# Patient Record
Sex: Female | Born: 1955
Health system: Southern US, Community
[De-identification: ages and names within clinical notes are randomized; demographics above are authoritative.]

## PROBLEM LIST (undated history)

## (undated) DIAGNOSIS — T4145XA Adverse effect of unspecified anesthetic, initial encounter: Secondary | ICD-10-CM

## (undated) DIAGNOSIS — T8859XA Other complications of anesthesia, initial encounter: Secondary | ICD-10-CM

## (undated) DIAGNOSIS — M069 Rheumatoid arthritis, unspecified: Secondary | ICD-10-CM

## (undated) HISTORY — PX: TUBAL LIGATION: SHX77

---

## 2004-03-23 ENCOUNTER — Ambulatory Visit (HOSPITAL_COMMUNITY): Admission: RE | Admit: 2004-03-23 | Discharge: 2004-03-23 | Payer: Self-pay | Admitting: Obstetrics and Gynecology

## 2004-04-07 ENCOUNTER — Ambulatory Visit (HOSPITAL_COMMUNITY): Admission: RE | Admit: 2004-04-07 | Discharge: 2004-04-07 | Payer: Self-pay | Admitting: Obstetrics and Gynecology

## 2006-07-24 ENCOUNTER — Ambulatory Visit (HOSPITAL_COMMUNITY): Admission: RE | Admit: 2006-07-24 | Discharge: 2006-07-24 | Payer: Self-pay | Admitting: Internal Medicine

## 2006-08-08 ENCOUNTER — Ambulatory Visit (HOSPITAL_COMMUNITY): Admission: RE | Admit: 2006-08-08 | Discharge: 2006-08-08 | Payer: Self-pay | Admitting: Internal Medicine

## 2006-08-11 ENCOUNTER — Ambulatory Visit (HOSPITAL_COMMUNITY): Admission: RE | Admit: 2006-08-11 | Discharge: 2006-08-11 | Payer: Self-pay | Admitting: Internal Medicine

## 2006-11-15 ENCOUNTER — Ambulatory Visit (HOSPITAL_COMMUNITY): Admission: RE | Admit: 2006-11-15 | Discharge: 2006-11-15 | Payer: Self-pay | Admitting: Internal Medicine

## 2007-01-25 ENCOUNTER — Ambulatory Visit (HOSPITAL_COMMUNITY): Admission: RE | Admit: 2007-01-25 | Discharge: 2007-01-25 | Payer: Self-pay | Admitting: Internal Medicine

## 2007-02-19 ENCOUNTER — Ambulatory Visit (HOSPITAL_COMMUNITY): Admission: RE | Admit: 2007-02-19 | Discharge: 2007-02-19 | Payer: Self-pay | Admitting: Internal Medicine

## 2007-03-23 ENCOUNTER — Ambulatory Visit: Payer: Self-pay | Admitting: Internal Medicine

## 2007-07-23 ENCOUNTER — Ambulatory Visit (HOSPITAL_COMMUNITY): Admission: RE | Admit: 2007-07-23 | Discharge: 2007-07-23 | Payer: Self-pay | Admitting: Internal Medicine

## 2007-09-11 IMAGING — CT CT CHEST W/O CM
2 of 3 series · 15 of 36 positions shown, 18 images · IV contrast (agent unspecified)
Comparison: Chest radiographs 07/24/2006 and 08/08/2006. There are no other prior studies for correlation.

CLINICAL DATA: Right upper lobe nodularity on chest radiographs.  No reported history of malignancy.
CHEST CT WITHOUT CONTRAST:
TECHNIQUE: Multidetector CT imaging of the chest was performed following the standard protocol without IV contrast.

[Series 2: chestroutine 5.0 b40f · axial · 0.62mm/px · z∈[-320,-45]mm · 12 of 65 slices shown, 15 images]
[im 5/65  mediastinal]
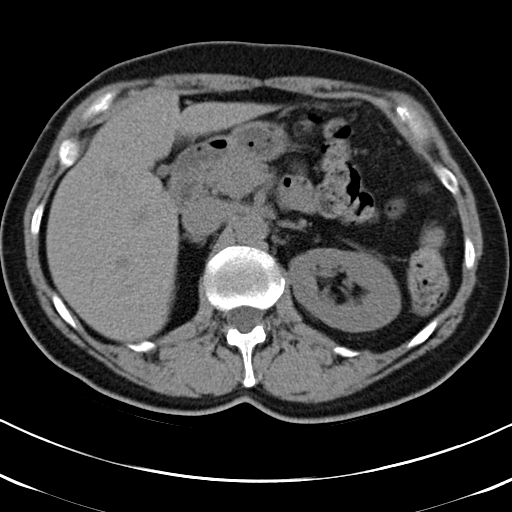
[im 5/65  lung]
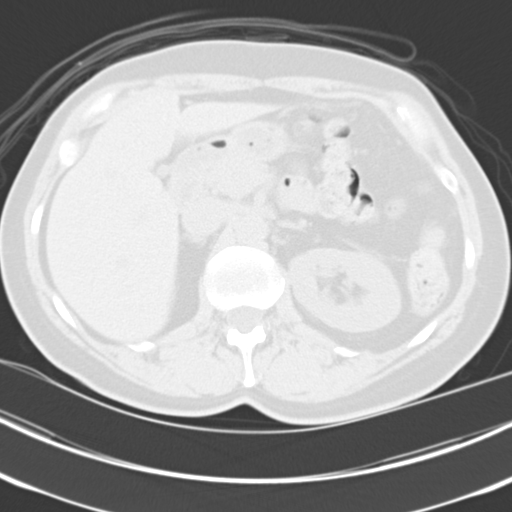
[im 10/65  lung]
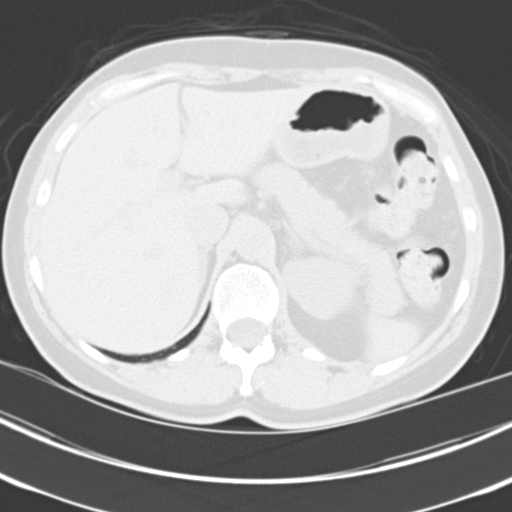
[im 15/65  lung]
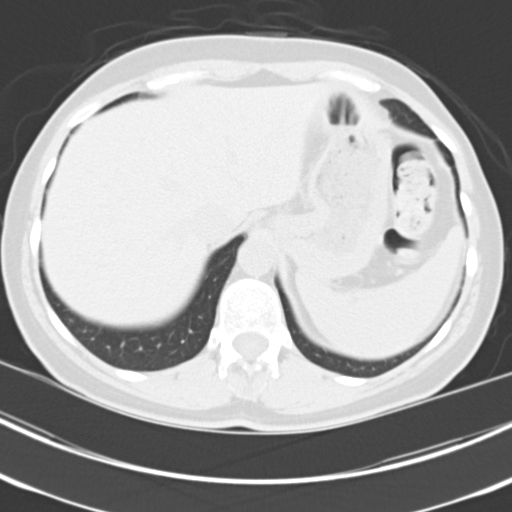
[im 19/65  lung]
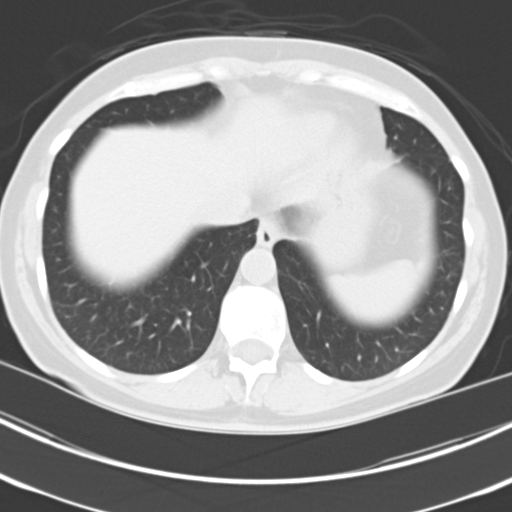
[im 24/65  mediastinal]
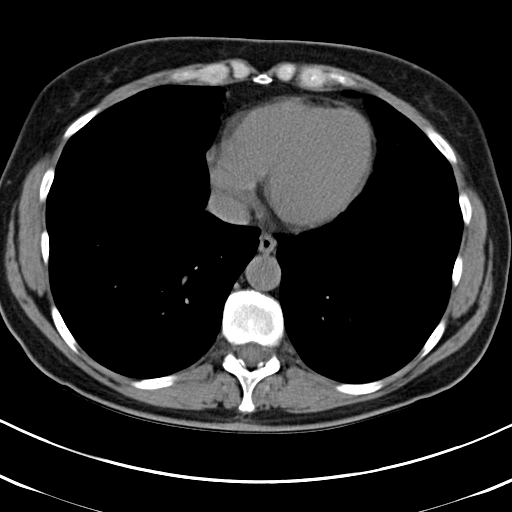
[im 24/65  lung]
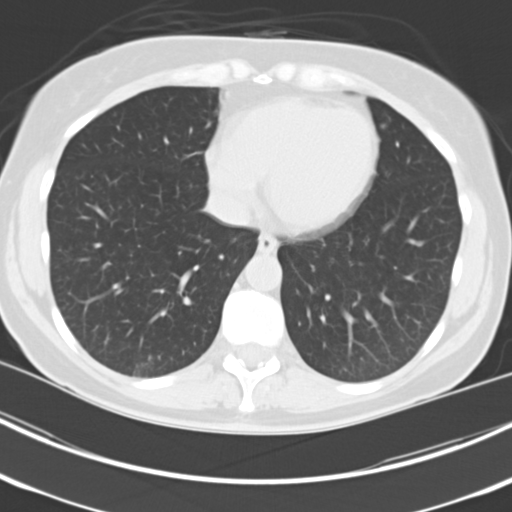
[im 29/65  lung]
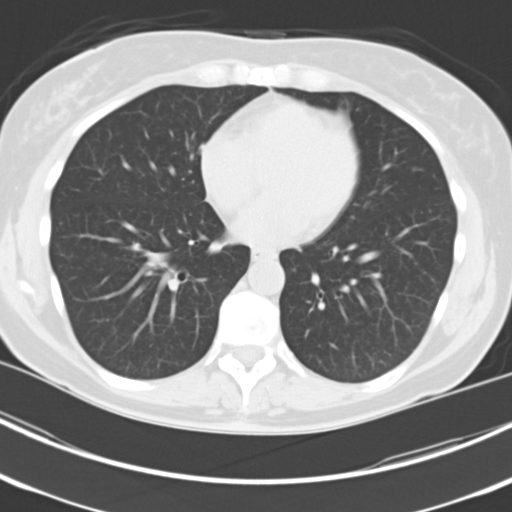
[im 36/65  lung]
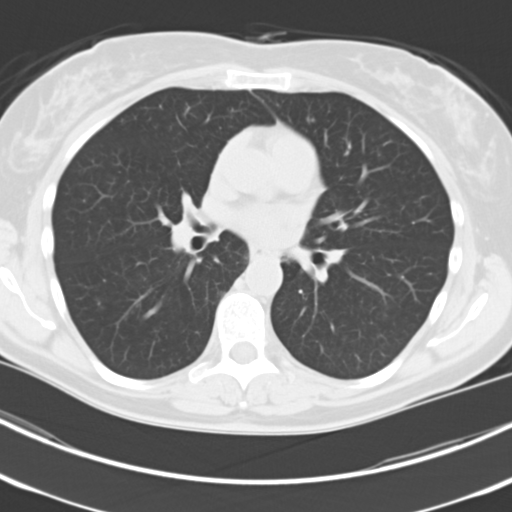
[im 41/65  lung]
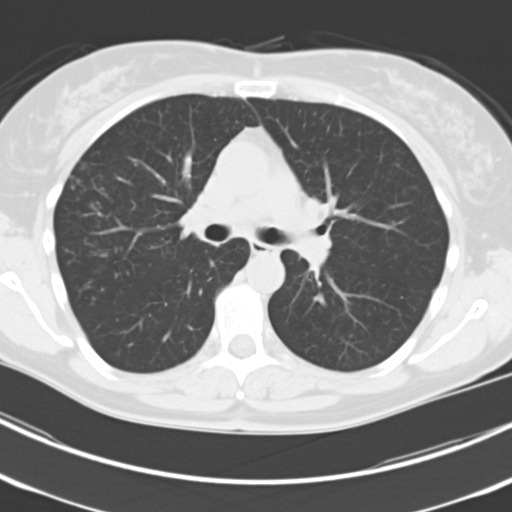
[im 46/65  mediastinal]
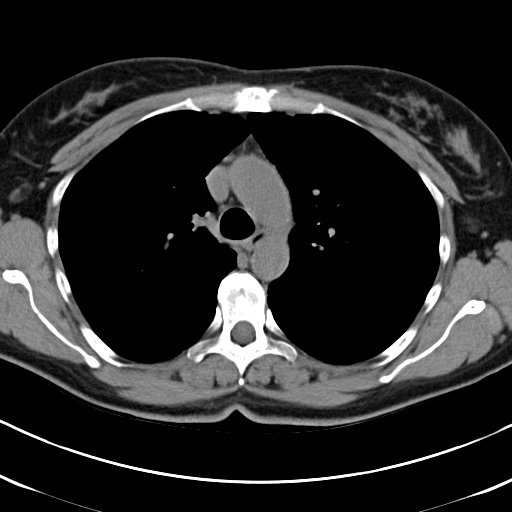
[im 46/65  lung]
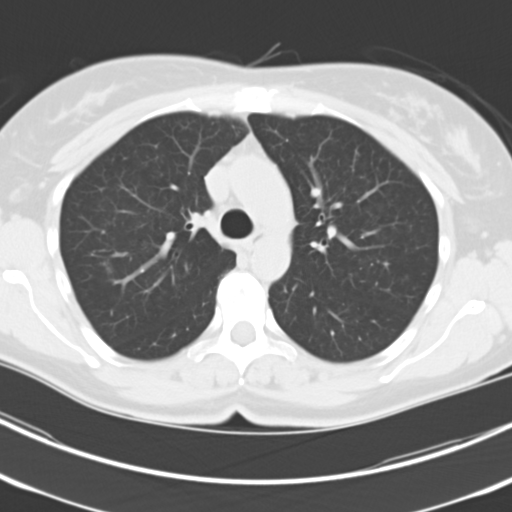
[im 50/65  lung]
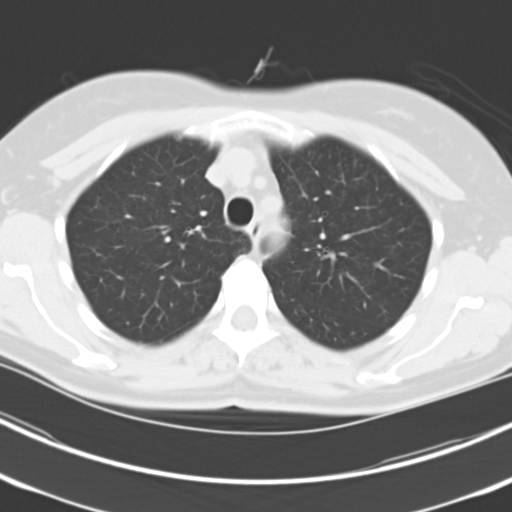
[im 55/65  lung]
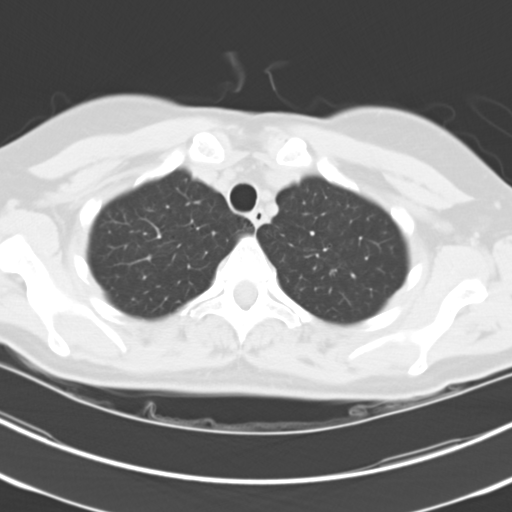
[im 60/65  lung]
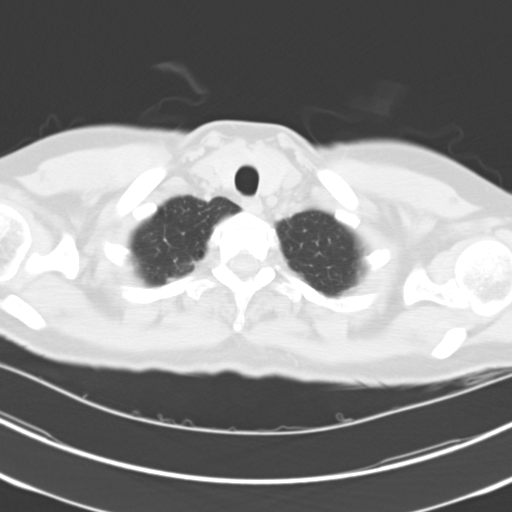

[Series 4: chestroutine 3.0 spo cor · coronal · 0.59mm/px · 3 of 71 slices shown]
[im 15/71  lung]
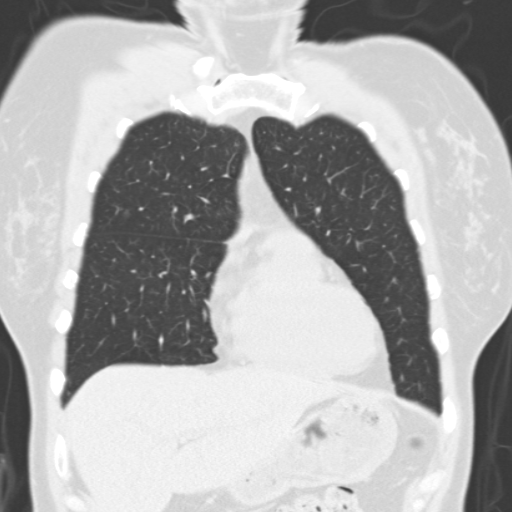
[im 29/71  lung]
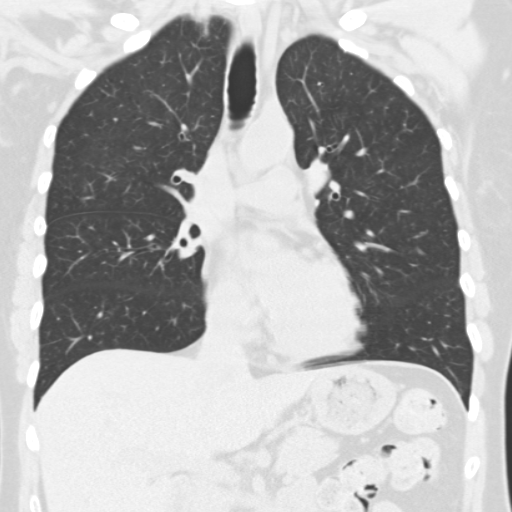
[im 43/71  lung]
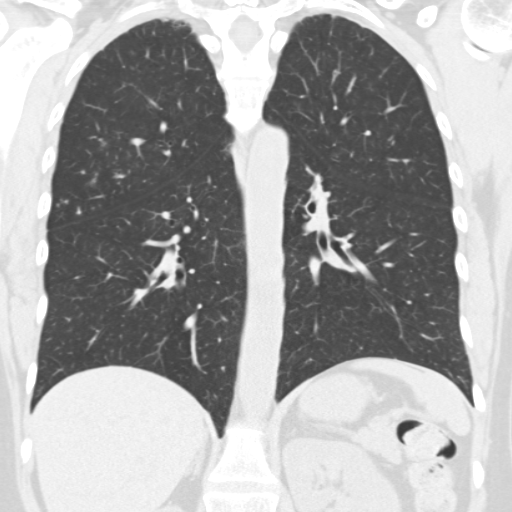

[15 of 36 positions shown; findings below may reference images not displayed]

FINDINGS: Corresponding with the chest radiographic density is a bilobed nodular right upper lobe density seen on axial image #27.  Two adjacent nodular components each measure 7mm in diameter.  More posteriorly in the right upper lobe, there is additional nodularity with a "tree and Toor" appearance suggesting an infectious or inflammatory process.  Aside from a 1mm nodule in the right middle lobe on image #35, there are no other pulmonary nodules.  There is no endobronchial lesion or confluent air space opacity.
Within the limitations of non-contrast technique, there is no evidence of mediastinal or hilar adenopathy.  There is no pleural or pericardial effusion.  The visualized upper abdomen appears unremarkable without evidence of adrenal mass.  No osseous abnormalities are demonstrated.
IMPRESSION: Two dominant adjacent right upper lobe nodules are associated with tree and Toor density in the adjacent right upper lobe and suggest an infectious/inflammatory process.  This would be an unusual appearance for neoplasm, and there is no adenopathy.  Clinical correlation and CT follow-up in 3-months are recommended.

## 2008-12-23 ENCOUNTER — Ambulatory Visit (HOSPITAL_COMMUNITY): Admission: RE | Admit: 2008-12-23 | Discharge: 2008-12-23 | Payer: Self-pay | Admitting: Internal Medicine

## 2010-12-07 NOTE — Assessment & Plan Note (Signed)
[facility]                             PULMONARY OFFICE NOTE   NAME:[name], [name]                  MRN:          [id number]  DATE:03/23/2007                            DOB:          08-Mar-1956    REFERRING PHYSICIAN:  Paula Compton. Willey Blade, MD   CHIEF COMPLAINT:  Pulmonary nodules.   HISTORY:  A 55 year old white female, active smoker, with clinical  diagnosis of pneumonia in November of 2007 with persistent abnormalities  in the right upper lobe by CT scan.  She actually denies any complaints.  The record indicates she has had current occasional flare-ups of  rhinitis/sinusitis/tracheal bronchitis, but is not on any form of  chronic medication.  She does have occasional night sweats which she  says have been present ever since she stopped having periods associated  with hot flashes.  She denies any unintended weight loss, persistent  purulent sputum, hemoptysis, pleuritic pain, or dyspnea with exertion,  fevers, chills, or unintended weight loss.   PAST MEDICAL HISTORY:  Significant for the absence of chronic medical  illnesses.   ALLERGIES:  None known.   MEDICATIONS:  None regularly.   SOCIAL HISTORY:  She continues to smoke 1-1/2 packs per day.  Where she  works she is exposed to oil fumes and sprays.   FAMILY HISTORY:  Significant for lots of cancer on her father's side of  the family, otherwise negative for respiratory diseases or atopy.   REVIEW OF SYSTEMS:  Taken in detail on the worksheet and negative except  as outlined above.   PHYSICAL EXAMINATION:  This is a pleasant, ambulatory white female who  has a slight nasal tone to her voice.  She is afebrile, normal vital signs.  HEENT:  Reveals mild turbinate edema, nonspecific.  Oropharynx is clear.  Ear canals clear bilaterally.  NECK:  Supple without cervical adenopathy or tenderness.  Trachea is  midline.  LUNGS:  Perfectly clear bilaterally to auscultation and percussion with  no localized, generalized, or otherwise wheezing or rhonchi.  HEART:  There is regular rate and rhythm without murmur, gallop, or rub.  No increase in P2.  ABDOMEN:  Soft, benign.  No hepatomegaly, mass, or tenderness.  EXTREMITIES:  Warm without calf tenderness, cyanosis, clubbing, or  edema.   Saturation is 96% on room air.   CT scan:  Most recent CT scan was reviewed dated July 28 and although  the CT scan report shows increased tree-in-bud appearance in the right  upper, the actual appearance is quite subtle and the nodules in question  that have been seen previously are actually slightly smaller.   IMPRESSION:  Most likely this patient has an element of chronic  obstructive pulmonary/chronic bronchitis with a quiet mucosary  dysfunction and an area of previous infection in the right upper lobe  which is somewhat synonymous with a bronchiectatic segment with poor  mucosary function and growth with saprophytic organisms, atypical  mycobacterial organisms.  However, this is really only a CT scan finding  as she has no clinical correlation.  All of her respiratory symptoms are  actually upper airway related to  rhinitis/tracheal bronchitis and also  probably contributed to by smoking.   Therefore I do not believe lung biopsy at this point is going to change  her therapy in that the disease is not severe enough to warrant chronic  systemic therapy (if this did turn out to be atypical mycobacterial  infection then we would be left with deciding whether to treat her for  two years or not and certainly this would not appear to be warranted).   Neoplasm is extremely unlikely given the above pattern.   I have therefore recommended follow-up here in three months with a chest  x-ray and PFTs and in the meantime pleaded with her to make the  commitment to quit smoking.     Tammy Deem. Melvyn Novas, MD, [facility]  Electronically Signed    MBW/MedQ  DD: 03/23/2007  DT: 03/25/2007  Job #: [id number]    cc:   Paula Compton. Willey Blade, MD

## 2011-06-14 ENCOUNTER — Other Ambulatory Visit (HOSPITAL_COMMUNITY): Payer: Self-pay | Admitting: Internal Medicine

## 2011-06-14 DIAGNOSIS — Z139 Encounter for screening, unspecified: Secondary | ICD-10-CM

## 2011-06-21 ENCOUNTER — Ambulatory Visit (HOSPITAL_COMMUNITY)
Admission: RE | Admit: 2011-06-21 | Discharge: 2011-06-21 | Disposition: A | Discharge status: Home / Self Care | Payer: BC Managed Care – PPO | Source: Ambulatory Visit | Attending: Internal Medicine | Admitting: Internal Medicine

## 2011-06-21 DIAGNOSIS — Z139 Encounter for screening, unspecified: Secondary | ICD-10-CM

## 2011-06-21 DIAGNOSIS — Z1231 Encounter for screening mammogram for malignant neoplasm of breast: Secondary | ICD-10-CM | POA: Insufficient documentation

## 2011-06-27 ENCOUNTER — Other Ambulatory Visit: Payer: Self-pay | Admitting: Internal Medicine

## 2011-06-27 DIAGNOSIS — R928 Other abnormal and inconclusive findings on diagnostic imaging of breast: Secondary | ICD-10-CM

## 2011-07-06 ENCOUNTER — Ambulatory Visit (HOSPITAL_COMMUNITY)
Admission: RE | Admit: 2011-07-06 | Discharge: 2011-07-06 | Disposition: A | Discharge status: Home / Self Care | Payer: BC Managed Care – PPO | Source: Ambulatory Visit | Attending: Internal Medicine | Admitting: Internal Medicine

## 2011-07-06 ENCOUNTER — Ambulatory Visit (HOSPITAL_COMMUNITY): Payer: BC Managed Care – PPO

## 2011-07-06 ENCOUNTER — Other Ambulatory Visit: Payer: Self-pay | Admitting: Internal Medicine

## 2011-07-06 DIAGNOSIS — R928 Other abnormal and inconclusive findings on diagnostic imaging of breast: Secondary | ICD-10-CM

## 2011-07-06 DIAGNOSIS — R921 Mammographic calcification found on diagnostic imaging of breast: Secondary | ICD-10-CM

## 2011-07-21 ENCOUNTER — Encounter (HOSPITAL_COMMUNITY): Payer: BC Managed Care – PPO

## 2011-07-21 ENCOUNTER — Ambulatory Visit
Admission: RE | Admit: 2011-07-21 | Discharge: 2011-07-21 | Disposition: A | Discharge status: Home / Self Care | Payer: BC Managed Care – PPO | Source: Ambulatory Visit | Attending: Internal Medicine | Admitting: Internal Medicine

## 2011-07-21 DIAGNOSIS — R921 Mammographic calcification found on diagnostic imaging of breast: Secondary | ICD-10-CM

## 2014-05-27 ENCOUNTER — Other Ambulatory Visit (HOSPITAL_COMMUNITY): Payer: Self-pay | Admitting: Internal Medicine

## 2014-05-27 DIAGNOSIS — Z139 Encounter for screening, unspecified: Secondary | ICD-10-CM

## 2014-05-28 ENCOUNTER — Ambulatory Visit (HOSPITAL_COMMUNITY)
Admission: RE | Admit: 2014-05-28 | Discharge: 2014-05-28 | Disposition: A | Discharge status: Home / Self Care | Payer: BC Managed Care – PPO | Source: Ambulatory Visit | Attending: Internal Medicine | Admitting: Internal Medicine

## 2014-05-28 DIAGNOSIS — Z1231 Encounter for screening mammogram for malignant neoplasm of breast: Secondary | ICD-10-CM | POA: Diagnosis not present

## 2014-05-28 DIAGNOSIS — Z139 Encounter for screening, unspecified: Secondary | ICD-10-CM

## 2014-05-30 ENCOUNTER — Other Ambulatory Visit: Payer: Self-pay | Admitting: Internal Medicine

## 2014-05-30 DIAGNOSIS — R928 Other abnormal and inconclusive findings on diagnostic imaging of breast: Secondary | ICD-10-CM

## 2014-06-04 ENCOUNTER — Other Ambulatory Visit: Payer: Self-pay | Admitting: Internal Medicine

## 2014-06-04 DIAGNOSIS — R928 Other abnormal and inconclusive findings on diagnostic imaging of breast: Secondary | ICD-10-CM

## 2014-06-30 ENCOUNTER — Telehealth: Payer: Self-pay | Admitting: *Deleted

## 2014-06-30 NOTE — Telephone Encounter (Signed)
Pt would like to order additional orthotics.

## 2014-07-08 ENCOUNTER — Ambulatory Visit (HOSPITAL_COMMUNITY)
Admission: RE | Admit: 2014-07-08 | Discharge: 2014-07-08 | Disposition: A | Discharge status: Home / Self Care | Payer: BC Managed Care – PPO | Source: Ambulatory Visit | Attending: Internal Medicine | Admitting: Internal Medicine

## 2014-07-08 ENCOUNTER — Encounter (HOSPITAL_COMMUNITY): Payer: BC Managed Care – PPO

## 2014-07-08 ENCOUNTER — Other Ambulatory Visit (HOSPITAL_COMMUNITY): Payer: Self-pay | Admitting: Internal Medicine

## 2014-07-08 DIAGNOSIS — N6002 Solitary cyst of left breast: Secondary | ICD-10-CM | POA: Insufficient documentation

## 2014-07-08 DIAGNOSIS — N6320 Unspecified lump in the left breast, unspecified quadrant: Secondary | ICD-10-CM

## 2014-07-08 DIAGNOSIS — Z1231 Encounter for screening mammogram for malignant neoplasm of breast: Secondary | ICD-10-CM | POA: Insufficient documentation

## 2014-07-08 DIAGNOSIS — R928 Other abnormal and inconclusive findings on diagnostic imaging of breast: Secondary | ICD-10-CM

## 2014-07-16 NOTE — Telephone Encounter (Signed)
I called the patient she was inquiring on ordering a 2nd pair of orthotics for both her husband and son-in-law.  I was able to place both orders for her and will follow up with her when they arrive [facility]

## 2014-07-16 NOTE — Telephone Encounter (Signed)
Melody,can you look into this for me, I believe I tried to see if this pt was [place] or [place] and i was unable to dtermine but have called Angela Cox yet about it. Thanks, she wants a second pair

## 2014-12-04 ENCOUNTER — Other Ambulatory Visit (HOSPITAL_COMMUNITY): Payer: Self-pay | Admitting: Internal Medicine

## 2014-12-04 DIAGNOSIS — N63 Unspecified lump in unspecified breast: Secondary | ICD-10-CM

## 2014-12-04 DIAGNOSIS — Z09 Encounter for follow-up examination after completed treatment for conditions other than malignant neoplasm: Secondary | ICD-10-CM

## 2015-01-13 ENCOUNTER — Ambulatory Visit (HOSPITAL_COMMUNITY)
Admission: RE | Admit: 2015-01-13 | Discharge: 2015-01-13 | Disposition: A | Discharge status: Home / Self Care | Payer: BLUE CROSS/BLUE SHIELD | Source: Ambulatory Visit | Attending: Internal Medicine | Admitting: Internal Medicine

## 2015-01-13 ENCOUNTER — Other Ambulatory Visit (HOSPITAL_COMMUNITY): Payer: Self-pay | Admitting: Internal Medicine

## 2015-01-13 DIAGNOSIS — N63 Unspecified lump in unspecified breast: Secondary | ICD-10-CM

## 2015-01-13 DIAGNOSIS — Z09 Encounter for follow-up examination after completed treatment for conditions other than malignant neoplasm: Secondary | ICD-10-CM

## 2015-01-13 DIAGNOSIS — N6320 Unspecified lump in the left breast, unspecified quadrant: Secondary | ICD-10-CM

## 2015-11-19 ENCOUNTER — Other Ambulatory Visit (HOSPITAL_COMMUNITY): Payer: Self-pay | Admitting: Internal Medicine

## 2015-11-19 DIAGNOSIS — N63 Unspecified lump in unspecified breast: Secondary | ICD-10-CM

## 2015-12-08 ENCOUNTER — Ambulatory Visit (HOSPITAL_COMMUNITY)
Admission: RE | Admit: 2015-12-08 | Discharge: 2015-12-08 | Disposition: A | Discharge status: Home / Self Care | Payer: BLUE CROSS/BLUE SHIELD | Source: Ambulatory Visit | Attending: Internal Medicine | Admitting: Internal Medicine

## 2015-12-08 DIAGNOSIS — N63 Unspecified lump in unspecified breast: Secondary | ICD-10-CM

## 2015-12-16 ENCOUNTER — Telehealth: Payer: Self-pay

## 2015-12-16 NOTE — Telephone Encounter (Signed)
Pt was referred by Dr. Willey Blade for screening colonoscopy. She would like to do in July. I have her on my call list when I get the July schedule.

## 2015-12-23 ENCOUNTER — Telehealth: Payer: Self-pay

## 2015-12-23 NOTE — Telephone Encounter (Signed)
PLEASE CALL PATIENT ABOUT TCS.  SHE NEEDS TO SCHEDULE   [phone]

## 2015-12-23 NOTE — Telephone Encounter (Signed)
Gastroenterology Pre-Procedure Review  Request Date: 12/23/2015 Requesting Physician: Dr. Willey Blade  PATIENT REVIEW QUESTIONS: The patient responded to the following health history questions as indicated:    PT HAS RHEUMATOID ARTHRITIS/ SHE HAS BEEN ON REMICADE BUT IS SWITCHING TO HUMIRA SOON NOT NOTED IN THE MED LIST  1. Diabetes Melitis: no 2. Joint replacements in the past 12 months: no 3. Major health problems in the past 3 months: no 4. Has an artificial valve or MVP: no 5. Has a defibrillator: no 6. Has been advised in past to take antibiotics in advance of a procedure like teeth cleaning: no 7. Family history of colon cancer: no  8. Alcohol Use: no 9. History of sleep apnea: no     MEDICATIONS & ALLERGIES:    Patient reports the following regarding taking any blood thinners:   Plavix? no Aspirin? no Coumadin? no  Patient confirms/reports the following medications:  Current Outpatient Prescriptions  Medication Sig Dispense Refill  . folic acid (FOLVITE) 1 MG tablet Take 1 mg by mouth daily. Takes 3 tablets daily    . methotrexate (RHEUMATREX) 2.5 MG tablet Take 2.5 mg by mouth once a week. Caution:Chemotherapy. Protect from light.  PT TAKES 6 TABLETS ONCE A WEEK     No current facility-administered medications for this visit.    Patient confirms/reports the following allergies:  No Known Allergies  No orders of the defined types were placed in this encounter.    AUTHORIZATION INFORMATION Primary Insurance:  ID #:   Group #:  Pre-Cert / Auth required:  Pre-Cert / Auth #:   Secondary Insurance:   ID #:  Group #:  Pre-Cert / Auth required:  Pre-Cert / Auth #:   SCHEDULE INFORMATION: Procedure has been scheduled as follows:  Date: 01/27/2016               Time:  8:30 AM Location: [facility] Short Stay  This Gastroenterology Pre-Precedure Review Form is being routed to the following provider(s): R. Garfield Cornea, MD

## 2015-12-23 NOTE — Telephone Encounter (Signed)
Ok to schedule.

## 2015-12-23 NOTE — Telephone Encounter (Signed)
See separate triage.  

## 2015-12-24 ENCOUNTER — Other Ambulatory Visit: Payer: Self-pay

## 2015-12-24 DIAGNOSIS — Z1211 Encounter for screening for malignant neoplasm of colon: Secondary | ICD-10-CM

## 2015-12-24 MED ORDER — PEG 3350-KCL-NA BICARB-NACL 420 G PO SOLR: 4000.0000 mL | ORAL | Status: AC

## 2015-12-24 NOTE — Addendum Note (Signed)
Addended by: Everardo All on: 12/24/2015 08:00 AM   Modules accepted: Orders

## 2015-12-24 NOTE — Telephone Encounter (Signed)
Rx sent to the pharmacy and instructions mailed to pt.  

## 2016-01-19 NOTE — Telephone Encounter (Signed)
Talked with Estill Bamberg at [facility] NO PA needed for TCS (ref# [id number])

## 2016-01-27 ENCOUNTER — Encounter (HOSPITAL_COMMUNITY): Payer: Self-pay | Admitting: *Deleted

## 2016-01-27 ENCOUNTER — Ambulatory Visit (HOSPITAL_COMMUNITY)
Admission: RE | Admit: 2016-01-27 | Discharge: 2016-01-27 | Disposition: A | Discharge status: Home / Self Care | Payer: BLUE CROSS/BLUE SHIELD | Source: Ambulatory Visit | Attending: Internal Medicine | Admitting: Internal Medicine

## 2016-01-27 ENCOUNTER — Encounter (HOSPITAL_COMMUNITY)
Admission: RE | Disposition: A | Discharge status: Home / Self Care | Payer: Self-pay | Source: Ambulatory Visit | Attending: Internal Medicine

## 2016-01-27 DIAGNOSIS — Z860100 Personal history of colon polyps, unspecified: Secondary | ICD-10-CM | POA: Insufficient documentation

## 2016-01-27 DIAGNOSIS — K635 Polyp of colon: Secondary | ICD-10-CM

## 2016-01-27 DIAGNOSIS — F17210 Nicotine dependence, cigarettes, uncomplicated: Secondary | ICD-10-CM | POA: Insufficient documentation

## 2016-01-27 DIAGNOSIS — Z1211 Encounter for screening for malignant neoplasm of colon: Secondary | ICD-10-CM | POA: Insufficient documentation

## 2016-01-27 DIAGNOSIS — D125 Benign neoplasm of sigmoid colon: Secondary | ICD-10-CM | POA: Diagnosis not present

## 2016-01-27 DIAGNOSIS — M069 Rheumatoid arthritis, unspecified: Secondary | ICD-10-CM | POA: Diagnosis not present

## 2016-01-27 HISTORY — DX: Adverse effect of unspecified anesthetic, initial encounter: T41.45XA

## 2016-01-27 HISTORY — DX: Rheumatoid arthritis, unspecified: M06.9

## 2016-01-27 HISTORY — DX: Other complications of anesthesia, initial encounter: T88.59XA

## 2016-01-27 HISTORY — PX: COLONOSCOPY: SHX5424

## 2016-01-27 SURGERY — COLONOSCOPY
Anesthesia: Moderate Sedation

## 2016-01-27 MED ORDER — SODIUM CHLORIDE 0.9 % IV SOLN
INTRAVENOUS | Status: DC
  Administered 2016-01-27: 08:00:00 via INTRAVENOUS

## 2016-01-27 MED ORDER — MEPERIDINE HCL 100 MG/ML IJ SOLN
INTRAMUSCULAR | Status: DC | PRN
  Administered 2016-01-27: 50 mg via INTRAVENOUS
  Administered 2016-01-27: 25 mg via INTRAVENOUS

## 2016-01-27 MED ORDER — MIDAZOLAM HCL 5 MG/5ML IJ SOLN
INTRAMUSCULAR | Status: DC | PRN
  Administered 2016-01-27: 2 mg via INTRAVENOUS
  Administered 2016-01-27 (×2): 1 mg via INTRAVENOUS
  Administered 2016-01-27: 2 mg via INTRAVENOUS

## 2016-01-27 MED ORDER — SIMETHICONE 40 MG/0.6ML PO SUSP
ORAL | Status: DC | PRN
  Administered 2016-01-27: 09:00:00

## 2016-01-27 MED ORDER — ONDANSETRON HCL 4 MG/2ML IJ SOLN
INTRAMUSCULAR | Status: DC | PRN
  Administered 2016-01-27: 4 mg via INTRAVENOUS

## 2016-01-27 MED ORDER — MEPERIDINE HCL 100 MG/ML IJ SOLN
INTRAMUSCULAR | Status: AC
  Filled 2016-01-27: qty 2

## 2016-01-27 MED ORDER — MIDAZOLAM HCL 5 MG/5ML IJ SOLN
INTRAMUSCULAR | Status: AC
  Filled 2016-01-27: qty 10

## 2016-01-27 MED ORDER — ONDANSETRON HCL 4 MG/2ML IJ SOLN
INTRAMUSCULAR | Status: AC
  Filled 2016-01-27: qty 2

## 2016-01-27 NOTE — Op Note (Signed)
[facility] Patient Name: [name] Procedure Date: 01/27/2016 8:46 AM MRN: [id number] Date of Birth: Aug 14, 1955 Attending MD: Norvel Richards , MD CSN: [id number] Age: 60 Admit Type: Outpatient Procedure:                Colonoscopy with biopsy and snare polypectomy Indications:              Screening for colorectal malignant neoplasm Providers:                Norvel Richards, MD Referring MD:              Medicines:                Midazolam 6 mg IV, Meperidine 75 mg IV, Ondansetron                            4 mg IV Complications:            No immediate complications. Estimated Blood Loss:     Estimated blood loss was minimal. Procedure:                Pre-Anesthesia Assessment:                           - Prior to the procedure, a History and Physical                            was performed, and patient medications and                            allergies were reviewed. The patient's tolerance of                            previous anesthesia was also reviewed. The risks                            and benefits of the procedure and the sedation                            options and risks were discussed with the patient.                            All questions were answered, and informed consent                            was obtained. Prior Anticoagulants: The patient has                            taken no previous anticoagulant or antiplatelet                            agents. ASA Grade Assessment: II - A patient with                            mild systemic disease. After reviewing the risks  and benefits, the patient was deemed in                            satisfactory condition to undergo the procedure.                           After obtaining informed consent, the colonoscope                            was passed under direct vision. Throughout the                            procedure, the patient's blood pressure, pulse,  and                            oxygen saturations were monitored continuously. The                            [id number]) was introduced through the anus                            and advanced to the the cecum, identified by                            appendiceal orifice and ileocecal valve. The                            colonoscopy was performed without difficulty. The                            patient tolerated the procedure well. The quality                            of the bowel preparation was adequate. The                            ileocecal valve, appendiceal orifice, and rectum                            were photographed. The colonoscopy was performed                            without difficulty. The patient tolerated the                            procedure well. The entire colon was well                            visualized. Scope In: 9:08:01 AM Scope Out: 9:25:08 AM Scope Withdrawal Time: 0 hours 12 minutes 36 seconds  Total Procedure Duration: 0 hours 17 minutes 7 seconds  Findings:      The perianal and digital rectal examinations were normal.      A 4 mm polyp was found in the sigmoid colon. The polyp was  semi-pedunculated. The polyp was removed with a cold snare. Resection       and retrieval were complete. Estimated blood loss was minimal.      A 2 mm polyp was found in the sigmoid colon. The polyp was sessile. The       polyp was removed with a cold biopsy forceps. Resection and retrieval       were complete. Estimated blood loss was minimal.      No other significant abnormalities were identified in a careful       examination of the remainder of the colon.      The exam was otherwise without abnormality on direct and retroflexion       views. Impression:               - One 4 mm polyp in the sigmoid colon, removed with                            a cold snare. Resected and retrieved.                           - One 2 mm polyp in the sigmoid colon,  removed with                            a cold biopsy forceps. Resected and retrieved.                           - The examination was otherwise normal on direct                            and retroflexion views. Moderate Sedation:      Moderate (conscious) sedation was administered by the endoscopy nurse       and supervised by the endoscopist. The following parameters were       monitored: oxygen saturation, heart rate, blood pressure, respiratory       rate, EKG, adequacy of pulmonary ventilation, and response to care.       Total physician intraservice time was 27 minutes. Recommendation:           - Written discharge instructions were provided to                            the patient.                           - Advance diet as tolerated today.                           - Continue present medications.                           - Repeat colonoscopy date to be determined after                            pending pathology results are reviewed for                            surveillance based on pathology  results.                           - Return to GI office after studies are complete.                           - Continue present medications. Procedure Code(s):        --- Professional ---                           [phone], Colonoscopy, flexible; with removal of                            tumor(s), polyp(s), or other lesion(s) by snare                            technique                           45380, 59, Colonoscopy, flexible; with biopsy,                            single or multiple                           99152, Moderate sedation services provided by the                            same physician or other qualified health care                            professional performing the diagnostic or                            therapeutic service that the sedation supports,                            requiring the presence of an independent trained                             observer to assist in the monitoring of the                            patient's level of consciousness and physiological                            status; initial 15 minutes of intraservice time,                            patient age 52 years or older                           [id number], Moderate sedation services; each additional                            15 minutes intraservice time Diagnosis  Code(s):        --- Professional ---                           Z12.11, Encounter for screening for malignant                            neoplasm of colon                           D12.5, Benign neoplasm of sigmoid colon CPT copyright 2016 American [employer] Association. All rights reserved. The codes documented in this report are preliminary and upon coder review may  be revised to meet current compliance requirements. Cristopher Estimable. Rourk, MD Norvel Richards, MD 01/27/2016 9:33:54 AM This report has been signed electronically. Number of Addenda: 0

## 2016-01-27 NOTE — Discharge Instructions (Signed)
°Colonoscopy °Discharge Instructions ° °Read the instructions outlined below and refer to this sheet in the next few weeks. These discharge instructions provide you with general information on caring for yourself after you leave the hospital. Your doctor may also give you specific instructions. While your treatment has been planned according to the most current medical practices available, unavoidable complications occasionally occur. If you have any problems or questions after discharge, call Dr. Rourk at [phone]. °ACTIVITY °· You may resume your regular activity, but move at a slower pace for the next 24 hours.  °· Take frequent rest periods for the next 24 hours.  °· Walking will help get rid of the air and reduce the bloated feeling in your belly (abdomen).  °· No driving for 24 hours (because of the medicine (anesthesia) used during the test).   °· Do not sign any important legal documents or operate any machinery for 24 hours (because of the anesthesia used during the test).  °NUTRITION °· Drink plenty of fluids.  °· You may resume your normal diet as instructed by your doctor.  °· Begin with a light meal and progress to your normal diet. Heavy or fried foods are harder to digest and may make you feel sick to your stomach (nauseated).  °· Avoid alcoholic beverages for 24 hours or as instructed.  °MEDICATIONS °· You may resume your normal medications unless your doctor tells you otherwise.  °WHAT YOU CAN EXPECT TODAY °· Some feelings of bloating in the abdomen.  °· Passage of more gas than usual.  °· Spotting of blood in your stool or on the toilet paper.  °IF YOU HAD POLYPS REMOVED DURING THE COLONOSCOPY: °· No aspirin products for 7 days or as instructed.  °· No alcohol for 7 days or as instructed.  °· Eat a soft diet for the next 24 hours.  °FINDING OUT THE RESULTS OF YOUR TEST °Not all test results are available during your visit. If your test results are not back during the visit, make an appointment  with your caregiver to find out the results. Do not assume everything is normal if you have not heard from your caregiver or the medical facility. It is important for you to follow up on all of your test results.  °SEEK IMMEDIATE MEDICAL ATTENTION IF: °· You have more than a spotting of blood in your stool.  °· Your belly is swollen (abdominal distention).  °· You are nauseated or vomiting.  °· You have a temperature over 101.  °· You have abdominal pain or discomfort that is severe or gets worse throughout the day.  ° °Polyp information provided ° °Further recommendations to follow pending review of pathology report ° °Colon Polyps °Polyps are lumps of extra tissue growing inside the body. Polyps can grow in the large intestine (colon). Most colon polyps are noncancerous (benign). However, some colon polyps can become cancerous over time. Polyps that are larger than a pea may be harmful. To be safe, caregivers remove and test all polyps. °CAUSES  °Polyps form when mutations in the genes cause your cells to grow and divide even though no more tissue is needed. °RISK FACTORS °There are a number of risk factors that can increase your chances of getting colon polyps. They include: °· Being older than 50 years. °· Family history of colon polyps or colon cancer. °· Long-term colon diseases, such as colitis or Crohn disease. °· Being overweight. °· Smoking. °· Being inactive. °· Drinking too much alcohol. °SYMPTOMS  °  Most small polyps do not cause symptoms. If symptoms are present, they may include: °· Blood in the stool. The stool may look dark red or black. °· Constipation or diarrhea that lasts longer than 1 week. °DIAGNOSIS °People often do not know they have polyps until their caregiver finds them during a regular checkup. Your caregiver can use 4 tests to check for polyps: °· Digital rectal exam. The caregiver wears gloves and feels inside the rectum. This test would find polyps only in the rectum. °· Barium enema.  The caregiver puts a liquid called barium into your rectum before taking X-rays of your colon. Barium makes your colon look white. Polyps are dark, so they are easy to see in the X-ray pictures. °· Sigmoidoscopy. A thin, flexible tube (sigmoidoscope) is placed into your rectum. The sigmoidoscope has a light and tiny camera in it. The caregiver uses the sigmoidoscope to look at the last third of your colon. °· Colonoscopy. This test is like sigmoidoscopy, but the caregiver looks at the entire colon. This is the most common method for finding and removing polyps. °TREATMENT  °Any polyps will be removed during a sigmoidoscopy or colonoscopy. The polyps are then tested for cancer. °PREVENTION  °To help lower your risk of getting more colon polyps: °· Eat plenty of fruits and vegetables. Avoid eating fatty foods. °· Do not smoke. °· Avoid drinking alcohol. °· Exercise every day. °· Lose weight if recommended by your caregiver. °· Eat plenty of calcium and folate. Foods that are rich in calcium include milk, cheese, and broccoli. Foods that are rich in folate include chickpeas, kidney beans, and spinach. °HOME CARE INSTRUCTIONS °Keep all follow-up appointments as directed by your caregiver. You may need periodic exams to check for polyps. °SEEK MEDICAL CARE IF: °You notice bleeding during a bowel movement. °  °This information is not intended to replace advice given to you by your health care provider. Make sure you discuss any questions you have with your health care provider. °  °Document Released: 04/06/2004 Document Revised: 08/01/2014 Document Reviewed: 09/20/2011 °[employer]vier Interactive Patient Education ©2016 [employer] ° °

## 2016-01-27 NOTE — H&P (Signed)
@  [id number]   Primary Care Physician:  Asencion Noble, MD Primary Gastroenterologist:  Dr. Gala Romney  Pre-Procedure History & Physical: HPI:  [name] is a 60 y.o. female is here for a screening colonoscopy. No bowel symptoms. No family history of any first-degree relatives with colon cancer. No prior colonoscopy.  Past Medical History  Diagnosis Date  . Rheumatoid arthritis ([place])   . Complication of anesthesia     PO nausea and vomiting    Past Surgical History  Procedure Laterality Date  . Cesarean section    . Tubal ligation      Prior to Admission medications   Medication Sig Start Date End Date Taking? Authorizing Provider  folic acid (FOLVITE) 1 MG tablet Take 1 mg by mouth daily. Takes 3 tablets daily   Yes Historical Provider, MD  methotrexate (RHEUMATREX) 2.5 MG tablet Take 2.5 mg by mouth once a week. Caution:Chemotherapy. Protect from light.  PT TAKES 6 TABLETS ONCE A WEEK   Yes Historical Provider, MD  polyethylene glycol-electrolytes (TRILYTE) 420 g solution Take 4,000 mLs by mouth as directed. 12/24/15  Yes Daneil Dolin, MD    Allergies as of 12/24/2015  . (No Known Allergies)    Family History  Problem Relation Age of Onset  . Colon cancer Maternal Grandmother     Social History   Social History  . Marital Status: Married    Spouse Name: N/A  . Number of Children: N/A  . Years of Education: N/A   Occupational History  . Not on file.   Social History Main Topics  . Smoking status: Current Every Day Smoker -- 1.00 packs/day    Types: Cigarettes  . Smokeless tobacco: Not on file  . Alcohol Use: Not on file  . Drug Use: Not on file  . Sexual Activity: Yes   Other Topics Concern  . Not on file   Social History Narrative  . No narrative on file    Review of Systems: See HPI, otherwise negative ROS  Physical Exam: BP 127/90 mmHg  Pulse 58  Temp(Src) 98.2 F (36.8 C) (Oral)  Resp 20  Ht 5\' 6"  (1.676 m)  Wt 146 lb (66.225 kg)  BMI 23.58  kg/m2  SpO2 100% General:   Alert,  Well-developed, well-nourished, pleasant and cooperative in NAD Head:  Normocephalic and atraumatic. Lungs:  Clear throughout to auscultation.   No wheezes, crackles, or rhonchi. No acute distress. Heart:  Regular rate and rhythm; no murmurs, clicks, rubs,  or gallops. Abdomen:  Soft, nontender and nondistended. No masses, hepatosplenomegaly or hernias noted. Normal bowel sounds, without guarding, and without rebound.   Msk:  Symmetrical without gross deformities. Normal posture. Pulses:  Normal pulses noted. Extremities:  Without clubbing or edema.  Impression/Plan:  [name] is now here to undergo a screening colonoscopy.  First ever average risk screening examination.  Risks, benefits, limitations, imponderables and alternatives regarding colonoscopy have been reviewed with the patient. Questions have been answered. All parties agreeable.     Notice:  This dictation was prepared with Dragon dictation along with smaller phrase technology. Any transcriptional errors that result from this process are unintentional and may not be corrected upon review.

## 2016-01-29 ENCOUNTER — Encounter: Payer: Self-pay | Admitting: Internal Medicine

## 2016-01-29 ENCOUNTER — Encounter (HOSPITAL_COMMUNITY): Payer: Self-pay | Admitting: Internal Medicine

## 2016-06-21 ENCOUNTER — Ambulatory Visit (HOSPITAL_BASED_OUTPATIENT_CLINIC_OR_DEPARTMENT_OTHER): Payer: Self-pay | Admitting: Pharmacist

## 2016-06-21 DIAGNOSIS — M069 Rheumatoid arthritis, unspecified: Secondary | ICD-10-CM

## 2016-06-21 NOTE — Progress Notes (Signed)
   S: Patient presents today to the [facility].  Patient is currently taking Humira for rheumatoid arthritis. Patient is managed by Dr. Amil Amen for this.   Adherence: denies any missed doses  Efficacy: improved efficacy over Remicade  Dosing:   Rheumatoid arthritis: SubQ: 40 mg every other week (may continue methotrexate, other nonbiologic DMARDS, corticosteroids, NSAIDs, and/or analgesics)  Drug-drug interactions: none  Screening: TB test: completed per patient Hepatitis: completed per patient  Monitoring: S/sx of infection: denies CBC: monitored by Dr. Amil Amen S/sx of hypersensitivity: denies S/sx of malignancy: denies S/sx of heart failure: denies  O:     No results found for: WBC, HGB, HCT, MCV, PLT    Chemistry   No results found for: NA, K, CL, CO2, BUN, CREATININE, GLU No results found for: CALCIUM, ALKPHOS, AST, ALT, BILITOT     A/P: 1. Medication review: Patient currently on Humira for rheumatoid arthritis and is under improved control on Humira with no adverse effects. Reviewed the medication with the patient, including the following: Humira is a TNF blocking agent indicated for ankylosing spondylitis, Crohn's disease, Hidradenitis suppurativa, psoriatic arthritis, plaque psoriasis, ulcerative colitis, and uveitis. The most common adverse effects are infections, headache, and injection site reactions. There is the possibility of an increased risk of malignancy but it is not well understood if this increased risk is due to there medication or the disease state. There are rare cases of pancytopenia and aplastic anemia. No recommendations for any changes. Will request most recent lab work from Dr. Melissa Noon office.    Nicoletta Ba, PharmD, BCPS, BCACP, [facility] [phone]

## 2016-06-22 ENCOUNTER — Other Ambulatory Visit: Payer: Self-pay | Admitting: Pharmacist

## 2016-06-22 MED ORDER — ADALIMUMAB 40 MG/0.8ML ~~LOC~~ AJKT: 1.0000 "pen " | AUTO-INJECTOR | SUBCUTANEOUS | 1 refills | Status: DC

## 2016-06-22 MED FILL — HUMIRA PEN 40 MG/0.8ML PNKT: 40 | 28 days supply | Qty: 2 | Fill #0

## 2016-07-05 DIAGNOSIS — M0579 Rheumatoid arthritis with rheumatoid factor of multiple sites without organ or systems involvement: Secondary | ICD-10-CM | POA: Diagnosis not present

## 2016-07-05 DIAGNOSIS — R5382 Chronic fatigue, unspecified: Secondary | ICD-10-CM | POA: Diagnosis not present

## 2016-07-05 DIAGNOSIS — Z6824 Body mass index (BMI) 24.0-24.9, adult: Secondary | ICD-10-CM | POA: Diagnosis not present

## 2016-07-05 DIAGNOSIS — Z79899 Other long term (current) drug therapy: Secondary | ICD-10-CM | POA: Diagnosis not present

## 2016-07-29 MED FILL — HUMIRA PEN 40 MG/0.8ML PNKT: 40 | 28 days supply | Qty: 2 | Fill #1

## 2016-07-29 MED FILL — METHOTREXATE 2.5 MG TABLET: 2.5 | 28 days supply | Qty: 24 | Fill #0

## 2016-07-29 MED FILL — FOLIC ACID 1 MG TABLET: 1 | 60 days supply | Qty: 180 | Fill #0

## 2016-08-17 ENCOUNTER — Other Ambulatory Visit: Payer: Self-pay | Admitting: Pharmacist

## 2016-08-17 MED ORDER — ADALIMUMAB 40 MG/0.8ML ~~LOC~~ AJKT: 1.0000 "pen " | AUTO-INJECTOR | SUBCUTANEOUS | 1 refills | Status: DC

## 2016-08-22 MED FILL — HUMIRA PEN 40 MG/0.8ML PNKT: 40 | 28 days supply | Qty: 2 | Fill #0

## 2016-09-20 MED FILL — METHOTREXATE 2.5 MG TABLET: 2.5 | 28 days supply | Qty: 12 | Fill #0

## 2016-09-20 MED FILL — HUMIRA PEN 40 MG/0.8ML PNKT: 40 | 28 days supply | Qty: 2 | Fill #1

## 2016-10-17 ENCOUNTER — Other Ambulatory Visit: Payer: Self-pay | Admitting: Internal Medicine

## 2016-10-17 ENCOUNTER — Other Ambulatory Visit: Payer: Self-pay | Admitting: Pharmacist

## 2016-10-17 MED ORDER — ADALIMUMAB 40 MG/0.8ML ~~LOC~~ AJKT: 1.0000 "pen " | AUTO-INJECTOR | SUBCUTANEOUS | 3 refills | Status: DC

## 2016-10-17 MED FILL — HUMIRA PEN 40 MG/0.8ML PNKT: 40 | 28 days supply | Qty: 2 | Fill #0

## 2016-10-17 MED FILL — METHOTREXATE 2.5 MG TABLET: 2.5 | 28 days supply | Qty: 12 | Fill #0

## 2016-10-17 MED FILL — FOLIC ACID 1 MG TABLET: 1 | 60 days supply | Qty: 180 | Fill #0

## 2016-11-08 DIAGNOSIS — Z79899 Other long term (current) drug therapy: Secondary | ICD-10-CM | POA: Diagnosis not present

## 2016-11-08 DIAGNOSIS — E663 Overweight: Secondary | ICD-10-CM | POA: Diagnosis not present

## 2016-11-08 DIAGNOSIS — R5382 Chronic fatigue, unspecified: Secondary | ICD-10-CM | POA: Diagnosis not present

## 2016-11-08 DIAGNOSIS — M0579 Rheumatoid arthritis with rheumatoid factor of multiple sites without organ or systems involvement: Secondary | ICD-10-CM | POA: Diagnosis not present

## 2016-11-08 DIAGNOSIS — Z6825 Body mass index (BMI) 25.0-25.9, adult: Secondary | ICD-10-CM | POA: Diagnosis not present

## 2016-11-08 MED FILL — LEFLUNOMIDE 10 MG TABLET: 10 | 30 days supply | Qty: 30 | Fill #0

## 2016-11-14 MED FILL — HUMIRA PEN 40 MG/0.8ML PNKT: 40 | 28 days supply | Qty: 2 | Fill #1

## 2016-12-12 MED FILL — LEFLUNOMIDE 10 MG TABLET: 10 | 30 days supply | Qty: 30 | Fill #1

## 2016-12-12 MED FILL — FOLIC ACID 1 MG TABLET: 1 | 60 days supply | Qty: 180 | Fill #1

## 2016-12-12 MED FILL — HUMIRA PEN 40 MG/0.8ML PNKT: 40 | 28 days supply | Qty: 2 | Fill #2

## 2017-01-09 MED FILL — HUMIRA PEN 40 MG/0.8ML PNKT: 40 | 28 days supply | Qty: 2 | Fill #3

## 2017-01-09 MED FILL — LEFLUNOMIDE 10 MG TABLET: 10 | 30 days supply | Qty: 30 | Fill #2

## 2017-01-31 DIAGNOSIS — M0579 Rheumatoid arthritis with rheumatoid factor of multiple sites without organ or systems involvement: Secondary | ICD-10-CM | POA: Diagnosis not present

## 2017-01-31 DIAGNOSIS — Z79899 Other long term (current) drug therapy: Secondary | ICD-10-CM | POA: Diagnosis not present

## 2017-01-31 DIAGNOSIS — E663 Overweight: Secondary | ICD-10-CM | POA: Diagnosis not present

## 2017-01-31 DIAGNOSIS — Z6825 Body mass index (BMI) 25.0-25.9, adult: Secondary | ICD-10-CM | POA: Diagnosis not present

## 2017-01-31 DIAGNOSIS — R5382 Chronic fatigue, unspecified: Secondary | ICD-10-CM | POA: Diagnosis not present

## 2017-02-06 MED FILL — LEFLUNOMIDE 10 MG TABLET: 10 | 30 days supply | Qty: 30 | Fill #3

## 2017-02-07 ENCOUNTER — Other Ambulatory Visit: Payer: Self-pay | Admitting: Pharmacist

## 2017-02-07 MED ORDER — ADALIMUMAB 40 MG/0.8ML ~~LOC~~ AJKT: 1.0000 "pen " | AUTO-INJECTOR | SUBCUTANEOUS | 6 refills | Status: DC

## 2017-02-07 MED FILL — HUMIRA PEN 40 MG/0.8ML PNKT: 40 | 28 days supply | Qty: 2 | Fill #0

## 2017-03-06 ENCOUNTER — Other Ambulatory Visit (HOSPITAL_COMMUNITY): Payer: Self-pay | Admitting: Internal Medicine

## 2017-03-06 DIAGNOSIS — Z1231 Encounter for screening mammogram for malignant neoplasm of breast: Secondary | ICD-10-CM

## 2017-03-06 MED FILL — FOLIC ACID 1 MG TABLET: 1 | 60 days supply | Qty: 180 | Fill #2

## 2017-03-06 MED FILL — HUMIRA PEN 40 MG/0.8ML PNKT: 40 | 28 days supply | Qty: 2 | Fill #1

## 2017-03-08 MED FILL — LEFLUNOMIDE 10 MG TABLET: 10 | 30 days supply | Qty: 30 | Fill #0

## 2017-04-04 MED FILL — LEFLUNOMIDE 10 MG TABLET: 10 | 30 days supply | Qty: 30 | Fill #1

## 2017-04-04 MED FILL — HUMIRA PEN 40 MG/0.8ML PNKT: 40 | 28 days supply | Qty: 2 | Fill #2

## 2017-04-20 ENCOUNTER — Other Ambulatory Visit (HOSPITAL_COMMUNITY): Payer: Self-pay | Admitting: Internal Medicine

## 2017-04-20 DIAGNOSIS — Z09 Encounter for follow-up examination after completed treatment for conditions other than malignant neoplasm: Secondary | ICD-10-CM

## 2017-04-20 DIAGNOSIS — N6320 Unspecified lump in the left breast, unspecified quadrant: Secondary | ICD-10-CM

## 2017-05-02 MED FILL — FOLIC ACID 1 MG TABLET: 1 | 60 days supply | Qty: 180 | Fill #0

## 2017-05-02 MED FILL — HUMIRA PEN 40 MG/0.8ML PNKT: 40 | 28 days supply | Qty: 2 | Fill #3

## 2017-05-02 MED FILL — LEFLUNOMIDE 10 MG TABLET: 10 | 30 days supply | Qty: 30 | Fill #2

## 2017-05-16 ENCOUNTER — Encounter (HOSPITAL_COMMUNITY): Payer: Self-pay

## 2017-05-16 ENCOUNTER — Ambulatory Visit (HOSPITAL_COMMUNITY)
Admission: RE | Admit: 2017-05-16 | Discharge: 2017-05-16 | Disposition: A | Discharge status: Home / Self Care | Payer: 59 | Source: Ambulatory Visit | Attending: Internal Medicine | Admitting: Internal Medicine

## 2017-05-16 DIAGNOSIS — R5382 Chronic fatigue, unspecified: Secondary | ICD-10-CM | POA: Diagnosis not present

## 2017-05-16 DIAGNOSIS — M0579 Rheumatoid arthritis with rheumatoid factor of multiple sites without organ or systems involvement: Secondary | ICD-10-CM | POA: Diagnosis not present

## 2017-05-16 DIAGNOSIS — R928 Other abnormal and inconclusive findings on diagnostic imaging of breast: Secondary | ICD-10-CM | POA: Diagnosis not present

## 2017-05-16 DIAGNOSIS — E663 Overweight: Secondary | ICD-10-CM | POA: Diagnosis not present

## 2017-05-16 DIAGNOSIS — Z79899 Other long term (current) drug therapy: Secondary | ICD-10-CM | POA: Diagnosis not present

## 2017-05-16 DIAGNOSIS — Z6825 Body mass index (BMI) 25.0-25.9, adult: Secondary | ICD-10-CM | POA: Diagnosis not present

## 2017-05-16 DIAGNOSIS — N6320 Unspecified lump in the left breast, unspecified quadrant: Secondary | ICD-10-CM

## 2017-05-16 DIAGNOSIS — N6002 Solitary cyst of left breast: Secondary | ICD-10-CM | POA: Insufficient documentation

## 2017-05-16 DIAGNOSIS — R922 Inconclusive mammogram: Secondary | ICD-10-CM | POA: Diagnosis not present

## 2017-05-16 DIAGNOSIS — Z09 Encounter for follow-up examination after completed treatment for conditions other than malignant neoplasm: Secondary | ICD-10-CM

## 2017-05-30 MED FILL — HUMIRA PEN 40 MG/0.8ML PNKT: 40 | 28 days supply | Qty: 2 | Fill #4

## 2017-05-30 MED FILL — LEFLUNOMIDE 10 MG TABLET: 10 | 30 days supply | Qty: 30 | Fill #0

## 2017-06-26 MED FILL — LEFLUNOMIDE 10 MG TABLET: 10 | 30 days supply | Qty: 30 | Fill #1

## 2017-06-26 MED FILL — HUMIRA PEN 40 MG/0.8ML PNKT: 40 | 28 days supply | Qty: 2 | Fill #5

## 2017-07-24 MED FILL — HUMIRA PEN 40 MG/0.8ML PNKT: 40 | 28 days supply | Qty: 2 | Fill #6

## 2017-07-24 MED FILL — FOLIC ACID 1 MG TABLET: 1 | 60 days supply | Qty: 180 | Fill #1

## 2017-08-17 DIAGNOSIS — M0579 Rheumatoid arthritis with rheumatoid factor of multiple sites without organ or systems involvement: Secondary | ICD-10-CM | POA: Diagnosis not present

## 2017-08-17 DIAGNOSIS — R5382 Chronic fatigue, unspecified: Secondary | ICD-10-CM | POA: Diagnosis not present

## 2017-08-17 DIAGNOSIS — Z6826 Body mass index (BMI) 26.0-26.9, adult: Secondary | ICD-10-CM | POA: Diagnosis not present

## 2017-08-17 DIAGNOSIS — Z79899 Other long term (current) drug therapy: Secondary | ICD-10-CM | POA: Diagnosis not present

## 2017-08-17 DIAGNOSIS — E663 Overweight: Secondary | ICD-10-CM | POA: Diagnosis not present

## 2017-08-21 ENCOUNTER — Other Ambulatory Visit: Payer: Self-pay | Admitting: Pharmacist

## 2017-08-21 MED ORDER — ADALIMUMAB 40 MG/0.8ML ~~LOC~~ AJKT: 1.0000 "pen " | AUTO-INJECTOR | SUBCUTANEOUS | 6 refills | Status: DC

## 2017-08-21 MED FILL — LEFLUNOMIDE 10 MG TABLET: 10 | 30 days supply | Qty: 30 | Fill #2

## 2017-08-23 ENCOUNTER — Telehealth: Payer: Self-pay | Admitting: Pharmacist

## 2017-08-23 MED FILL — VIT D3-50 50,000 UNITS CAPS: 1.25 MG | 28 days supply | Qty: 4 | Fill #0

## 2017-08-23 NOTE — Telephone Encounter (Signed)
Called patient to schedule an appointment for the [facility]. I was unable to reach the patient so I left a HIPAA-compliant message requesting that the patient return my call.   

## 2017-08-25 ENCOUNTER — Ambulatory Visit: Payer: 59 | Attending: Internal Medicine | Admitting: Pharmacist

## 2017-08-25 ENCOUNTER — Encounter: Payer: Self-pay | Admitting: Pharmacist

## 2017-08-25 DIAGNOSIS — Z79899 Other long term (current) drug therapy: Secondary | ICD-10-CM

## 2017-08-25 NOTE — Progress Notes (Signed)
   S: Patient presents today to the [facility].  Patient is currently taking Humira for rheumatoid arthritis. Patient is managed by Dr. Amil Amen for this.   Adherence: denies any missed doses  Efficacy: still working well. Denies any adverse effects.  Dosing:   Rheumatoid arthritis: SubQ: 40 mg every other week (may continue methotrexate, other nonbiologic DMARDS, corticosteroids, NSAIDs, and/or analgesics)  Drug-drug interactions: none  Screening: TB test: completed per patient Hepatitis: completed per patient  Monitoring: S/sx of infection: denies CBC: monitored by Dr. Amil Amen - reports it has been WNL S/sx of hypersensitivity: denies S/sx of malignancy: denies S/sx of heart failure: denies  O:     No results found for: WBC, HGB, HCT, MCV, PLT    Chemistry   No results found for: NA, K, CL, CO2, BUN, CREATININE, GLU No results found for: CALCIUM, ALKPHOS, AST, ALT, BILITOT     A/P: 1. Medication review: Patient currently on Humira for rheumatoid arthritis and is under improved control on Humira with no adverse effects. Reviewed the medication with the patient, including the following: Humira is a TNF blocking agent indicated for ankylosing spondylitis, Crohn's disease, Hidradenitis suppurativa, psoriatic arthritis, plaque psoriasis, ulcerative colitis, and uveitis. The most common adverse effects are infections, headache, and injection site reactions. There is the possibility of an increased risk of malignancy but it is not well understood if this increased risk is due to there medication or the disease state. There are rare cases of pancytopenia and aplastic anemia. No recommendations for any changes.    Christella Hartigan, PharmD, BCPS, BCACP, [facility] [phone]

## 2017-08-31 MED FILL — HUMIRA PEN 40 MG/0.8ML PNKT: 40 | 28 days supply | Qty: 2 | Fill #0

## 2017-09-18 MED FILL — LEFLUNOMIDE 10 MG TABLET: 10 | 30 days supply | Qty: 30 | Fill #0

## 2017-09-18 MED FILL — FOLIC ACID 1 MG TABLET: 1 | 60 days supply | Qty: 180 | Fill #2

## 2017-09-28 MED FILL — HUMIRA PEN 40 MG/0.8ML PNKT: 40 | 28 days supply | Qty: 2 | Fill #1

## 2017-10-18 MED FILL — LEFLUNOMIDE 10 MG TABLET: 10 | 30 days supply | Qty: 30 | Fill #1

## 2017-10-19 MED FILL — HUMIRA PEN 40 MG/0.8ML PNKT: 40 | 28 days supply | Qty: 2 | Fill #2

## 2017-11-15 MED FILL — FOLIC ACID 1 MG TABS: 1 | 60 days supply | Qty: 180 | Fill #0

## 2017-11-15 MED FILL — HUMIRA PEN 40 MG/0.8ML PNKT: 40 | 28 days supply | Qty: 2 | Fill #3

## 2017-11-15 MED FILL — LEFLUNOMIDE 10 MG TABLET: 10 | 30 days supply | Qty: 30 | Fill #0

## 2017-12-12 DIAGNOSIS — M0579 Rheumatoid arthritis with rheumatoid factor of multiple sites without organ or systems involvement: Secondary | ICD-10-CM | POA: Diagnosis not present

## 2017-12-12 DIAGNOSIS — E663 Overweight: Secondary | ICD-10-CM | POA: Diagnosis not present

## 2017-12-12 DIAGNOSIS — R5382 Chronic fatigue, unspecified: Secondary | ICD-10-CM | POA: Diagnosis not present

## 2017-12-12 DIAGNOSIS — E559 Vitamin D deficiency, unspecified: Secondary | ICD-10-CM | POA: Diagnosis not present

## 2017-12-12 DIAGNOSIS — Z79899 Other long term (current) drug therapy: Secondary | ICD-10-CM | POA: Diagnosis not present

## 2017-12-12 DIAGNOSIS — Z6826 Body mass index (BMI) 26.0-26.9, adult: Secondary | ICD-10-CM | POA: Diagnosis not present

## 2017-12-12 MED FILL — LEFLUNOMIDE 20 MG TABLET: 20 | 30 days supply | Qty: 30 | Fill #0

## 2017-12-13 MED FILL — HUMIRA PEN 40 MG/0.8ML PNKT: 40 | 28 days supply | Qty: 2 | Fill #4

## 2018-01-12 MED FILL — FOLIC ACID 1 MG TABS: 1 | 60 days supply | Qty: 180 | Fill #1

## 2018-01-12 MED FILL — HUMIRA PEN 40 MG/0.8ML PNKT: 40 | 28 days supply | Qty: 2 | Fill #5

## 2018-01-12 MED FILL — LEFLUNOMIDE 20 MG TABLET: 20 | 30 days supply | Qty: 30 | Fill #1

## 2018-01-18 DIAGNOSIS — Z6825 Body mass index (BMI) 25.0-25.9, adult: Secondary | ICD-10-CM | POA: Diagnosis not present

## 2018-01-18 DIAGNOSIS — N390 Urinary tract infection, site not specified: Secondary | ICD-10-CM | POA: Diagnosis not present

## 2018-01-29 DIAGNOSIS — N390 Urinary tract infection, site not specified: Secondary | ICD-10-CM | POA: Diagnosis not present

## 2018-02-07 MED FILL — HUMIRA PEN 40 MG/0.8ML PNKT: 40 | 28 days supply | Qty: 2 | Fill #6

## 2018-02-07 MED FILL — LEFLUNOMIDE 20 MG TABLET: 20 | 30 days supply | Qty: 30 | Fill #2

## 2018-02-19 DIAGNOSIS — N390 Urinary tract infection, site not specified: Secondary | ICD-10-CM | POA: Diagnosis not present

## 2018-03-07 ENCOUNTER — Other Ambulatory Visit: Payer: Self-pay | Admitting: Pharmacist

## 2018-03-07 MED ORDER — ADALIMUMAB 40 MG/0.4ML ~~LOC~~ AJKT: 0.4000 mL | AUTO-INJECTOR | SUBCUTANEOUS | 3 refills | Status: DC

## 2018-03-16 MED FILL — HUMIRA PEN 40 MG/0.4ML PNKT: 40 | 28 days supply | Qty: 2 | Fill #0

## 2018-03-19 DIAGNOSIS — E663 Overweight: Secondary | ICD-10-CM | POA: Diagnosis not present

## 2018-03-19 DIAGNOSIS — Z79899 Other long term (current) drug therapy: Secondary | ICD-10-CM | POA: Diagnosis not present

## 2018-03-19 DIAGNOSIS — R5382 Chronic fatigue, unspecified: Secondary | ICD-10-CM | POA: Diagnosis not present

## 2018-03-19 DIAGNOSIS — M7989 Other specified soft tissue disorders: Secondary | ICD-10-CM | POA: Diagnosis not present

## 2018-03-19 DIAGNOSIS — E559 Vitamin D deficiency, unspecified: Secondary | ICD-10-CM | POA: Diagnosis not present

## 2018-03-19 DIAGNOSIS — Z6826 Body mass index (BMI) 26.0-26.9, adult: Secondary | ICD-10-CM | POA: Diagnosis not present

## 2018-03-19 DIAGNOSIS — M0579 Rheumatoid arthritis with rheumatoid factor of multiple sites without organ or systems involvement: Secondary | ICD-10-CM | POA: Diagnosis not present

## 2018-03-21 MED FILL — FOLIC ACID 1 MG TABS: 1 | 60 days supply | Qty: 180 | Fill #0

## 2018-03-21 MED FILL — LEFLUNOMIDE 20 MG TABLET: 20 | 30 days supply | Qty: 30 | Fill #3

## 2018-04-09 MED FILL — HUMIRA PEN 40 MG/0.4ML PNKT: 40 | 28 days supply | Qty: 2 | Fill #1

## 2018-04-24 MED FILL — LEFLUNOMIDE 20 MG TABLET: 20 | 30 days supply | Qty: 30 | Fill #0

## 2018-05-02 MED FILL — HUMIRA PEN 40 MG/0.4ML PNKT: 40 | 28 days supply | Qty: 2 | Fill #2

## 2018-05-03 DIAGNOSIS — Z79899 Other long term (current) drug therapy: Secondary | ICD-10-CM | POA: Diagnosis not present

## 2018-05-03 DIAGNOSIS — M069 Rheumatoid arthritis, unspecified: Secondary | ICD-10-CM | POA: Diagnosis not present

## 2018-05-10 DIAGNOSIS — Z0001 Encounter for general adult medical examination with abnormal findings: Secondary | ICD-10-CM | POA: Diagnosis not present

## 2018-05-10 DIAGNOSIS — Z6827 Body mass index (BMI) 27.0-27.9, adult: Secondary | ICD-10-CM | POA: Diagnosis not present

## 2018-05-10 DIAGNOSIS — M0600 Rheumatoid arthritis without rheumatoid factor, unspecified site: Secondary | ICD-10-CM | POA: Diagnosis not present

## 2018-05-10 DIAGNOSIS — F17200 Nicotine dependence, unspecified, uncomplicated: Secondary | ICD-10-CM | POA: Diagnosis not present

## 2018-05-10 MED FILL — TRIAMCINOLONE 0.1% CREAM: 0.1 | 15 days supply | Qty: 30 | Fill #0

## 2018-05-11 ENCOUNTER — Other Ambulatory Visit (HOSPITAL_COMMUNITY): Payer: Self-pay | Admitting: Internal Medicine

## 2018-05-11 DIAGNOSIS — Z122 Encounter for screening for malignant neoplasm of respiratory organs: Secondary | ICD-10-CM

## 2018-05-17 DIAGNOSIS — E663 Overweight: Secondary | ICD-10-CM | POA: Diagnosis not present

## 2018-05-17 DIAGNOSIS — Z79899 Other long term (current) drug therapy: Secondary | ICD-10-CM | POA: Diagnosis not present

## 2018-05-17 DIAGNOSIS — E559 Vitamin D deficiency, unspecified: Secondary | ICD-10-CM | POA: Diagnosis not present

## 2018-05-17 DIAGNOSIS — Z6826 Body mass index (BMI) 26.0-26.9, adult: Secondary | ICD-10-CM | POA: Diagnosis not present

## 2018-05-17 DIAGNOSIS — R5382 Chronic fatigue, unspecified: Secondary | ICD-10-CM | POA: Diagnosis not present

## 2018-05-17 DIAGNOSIS — M7989 Other specified soft tissue disorders: Secondary | ICD-10-CM | POA: Diagnosis not present

## 2018-05-17 DIAGNOSIS — M0579 Rheumatoid arthritis with rheumatoid factor of multiple sites without organ or systems involvement: Secondary | ICD-10-CM | POA: Diagnosis not present

## 2018-05-29 MED FILL — LEFLUNOMIDE 20 MG TABLET: 20 | 30 days supply | Qty: 30 | Fill #1

## 2018-05-29 MED FILL — FOLIC ACID 1 MG TABS: 1 | 60 days supply | Qty: 180 | Fill #0

## 2018-06-01 ENCOUNTER — Ambulatory Visit (HOSPITAL_COMMUNITY)
Admission: RE | Admit: 2018-06-01 | Discharge: 2018-06-01 | Disposition: A | Discharge status: Home / Self Care | Payer: 59 | Source: Ambulatory Visit | Attending: Internal Medicine | Admitting: Internal Medicine

## 2018-06-01 DIAGNOSIS — F17200 Nicotine dependence, unspecified, uncomplicated: Secondary | ICD-10-CM | POA: Diagnosis not present

## 2018-06-01 DIAGNOSIS — Z122 Encounter for screening for malignant neoplasm of respiratory organs: Secondary | ICD-10-CM | POA: Insufficient documentation

## 2018-06-01 DIAGNOSIS — J439 Emphysema, unspecified: Secondary | ICD-10-CM | POA: Insufficient documentation

## 2018-06-01 DIAGNOSIS — F17210 Nicotine dependence, cigarettes, uncomplicated: Secondary | ICD-10-CM | POA: Diagnosis not present

## 2018-06-04 MED FILL — HUMIRA PEN 40 MG/0.4ML PNKT: 40 | 28 days supply | Qty: 2 | Fill #3

## 2018-06-06 ENCOUNTER — Ambulatory Visit (INDEPENDENT_AMBULATORY_CARE_PROVIDER_SITE_OTHER): Payer: 59 | Admitting: Pharmacist

## 2018-06-06 ENCOUNTER — Other Ambulatory Visit (HOSPITAL_COMMUNITY): Payer: Self-pay | Admitting: Internal Medicine

## 2018-06-06 DIAGNOSIS — Z1231 Encounter for screening mammogram for malignant neoplasm of breast: Secondary | ICD-10-CM

## 2018-06-06 DIAGNOSIS — Z79899 Other long term (current) drug therapy: Secondary | ICD-10-CM

## 2018-06-06 MED ORDER — TOFACITINIB CITRATE ER 11 MG PO TB24: 1.0000 | ORAL_TABLET | Freq: Every day | ORAL | 3 refills | Status: DC

## 2018-06-06 NOTE — Progress Notes (Signed)
   S: Patient presents to [facility] for review of their specialty medication therapy.  Patient is currently taking Xeljanz for rheumatoid arthritis. Patient is managed by Dr. Amil Amen for this.   Adherence: has not started yet  Efficacy: has not started yet, was previously on Humira but it stopped working  Dosing:  Rheumatoid arthritis (monotherapy or in combination with nonbiologic disease-modifying antirheumatic drugs (DMARDs): Oral: Extended release: 11 mg once daily   Dosing: Adjustment for Toxicity  Lymphopenia (lymphocytes ?500 cells/mm3): Maintain dose. Lymphopenia (lymphocytes <500 cells/mm3) confirmed by repeat evaluation: Discontinue therapy. Neutropenia (ANC >1,000 cells/mm3): Maintain dose. Neutropenia (ANC persistently between 500 to 1,000 cells/mm3): Interrupt therapy; resume at 5 mg immediate release twice daily or 11 mg extended release once daily when ANC >1,000 cells/mm3. Neutropenia (ANC <500 cells/mm3) confirmed by repeat evaluation: Discontinue therapy. Anemia (hemoglobin ?9 g/dL and decrease ?2 g/dL): Maintain dose. Anemia (hemoglobin <8 g/dL or decrease >2 g/dL) confirmed by repeat evaluation: Interrupt therapy until hemoglobin values have normalized.    Monitoring: S/sx of infection: denies S/sx of malignancy: denies Bone marrow suppression: denies, labs monitored by Dr. Melissa Noon office GI adverse effects: has not started yet Headache: has not started yet LFTs: labs monitored by Dr. Melissa Noon office Cardiovascular effects (decreased HR): has not started yet   O:     No results found for: WBC, HGB, HCT, MCV, PLT    Chemistry   No results found for: NA, K, CL, CO2, BUN, CREATININE, GLU No results found for: CALCIUM, ALKPHOS, AST, ALT, BILITOT     A/P: 1. Medication review: patient prescribed Xeljanz for rheumatoid arthritis but has not started it yet. Has recently failed Humira. Reviewed the medications with the patient including the  following: Xeljanz (tofacitinib) is a Janus Associated Kinase Inhibitor which prevents cytokine- or growth factor-medicated gene expression and intracellular activity of immune cells, reduces circulating CD16/56+ natural killer cells, serum IgG, IgM, IgA, and C-reactive protein, and increases B cells. Patient educated on purpose, proper use and potential adverse effects of Xeljanz.  Adverse effects include increased risk of infection, risk of malignancy, as well as bone marrow suppression, GI upset, and decreased heart rate. Patient should avoid live vaccinations while on this medication. No recommendations for changes at this time   Christella Hartigan, PharmD, BCPS, BCACP, CPP Clinical Pharmacist Practitioner  [phone]

## 2018-06-07 DIAGNOSIS — N390 Urinary tract infection, site not specified: Secondary | ICD-10-CM | POA: Diagnosis not present

## 2018-06-07 DIAGNOSIS — N3000 Acute cystitis without hematuria: Secondary | ICD-10-CM | POA: Diagnosis not present

## 2018-06-14 MED FILL — XELJANZ XR 11 MG TB24: 11 | 30 days supply | Qty: 30 | Fill #0

## 2018-06-18 DIAGNOSIS — H5203 Hypermetropia, bilateral: Secondary | ICD-10-CM | POA: Diagnosis not present

## 2018-06-18 DIAGNOSIS — H2513 Age-related nuclear cataract, bilateral: Secondary | ICD-10-CM | POA: Diagnosis not present

## 2018-06-18 DIAGNOSIS — M069 Rheumatoid arthritis, unspecified: Secondary | ICD-10-CM | POA: Diagnosis not present

## 2018-06-18 DIAGNOSIS — H18413 Arcus senilis, bilateral: Secondary | ICD-10-CM | POA: Diagnosis not present

## 2018-06-29 ENCOUNTER — Ambulatory Visit (HOSPITAL_COMMUNITY)
Admission: RE | Admit: 2018-06-29 | Discharge: 2018-06-29 | Disposition: A | Discharge status: Home / Self Care | Payer: 59 | Source: Ambulatory Visit | Attending: Internal Medicine | Admitting: Internal Medicine

## 2018-06-29 DIAGNOSIS — Z1231 Encounter for screening mammogram for malignant neoplasm of breast: Secondary | ICD-10-CM | POA: Diagnosis not present

## 2018-07-02 MED FILL — LEFLUNOMIDE 20 MG TABLET: 20 | 30 days supply | Qty: 30 | Fill #2

## 2018-07-03 DIAGNOSIS — M0579 Rheumatoid arthritis with rheumatoid factor of multiple sites without organ or systems involvement: Secondary | ICD-10-CM | POA: Diagnosis not present

## 2018-07-03 DIAGNOSIS — Z6827 Body mass index (BMI) 27.0-27.9, adult: Secondary | ICD-10-CM | POA: Diagnosis not present

## 2018-07-03 DIAGNOSIS — E663 Overweight: Secondary | ICD-10-CM | POA: Diagnosis not present

## 2018-07-03 DIAGNOSIS — E559 Vitamin D deficiency, unspecified: Secondary | ICD-10-CM | POA: Diagnosis not present

## 2018-07-03 DIAGNOSIS — R5382 Chronic fatigue, unspecified: Secondary | ICD-10-CM | POA: Diagnosis not present

## 2018-07-03 DIAGNOSIS — M7989 Other specified soft tissue disorders: Secondary | ICD-10-CM | POA: Diagnosis not present

## 2018-07-03 DIAGNOSIS — Z79899 Other long term (current) drug therapy: Secondary | ICD-10-CM | POA: Diagnosis not present

## 2018-07-10 ENCOUNTER — Ambulatory Visit: Payer: 59 | Admitting: Urology

## 2018-07-10 DIAGNOSIS — N3000 Acute cystitis without hematuria: Secondary | ICD-10-CM | POA: Diagnosis not present

## 2018-07-16 MED FILL — XELJANZ XR 11 MG TB24: 11 | 30 days supply | Qty: 30 | Fill #1

## 2018-07-24 ENCOUNTER — Other Ambulatory Visit: Payer: Self-pay | Admitting: Urology

## 2018-07-24 ENCOUNTER — Other Ambulatory Visit (HOSPITAL_COMMUNITY): Payer: Self-pay | Admitting: Urology

## 2018-07-24 DIAGNOSIS — N3000 Acute cystitis without hematuria: Secondary | ICD-10-CM

## 2018-07-27 MED FILL — FOLIC ACID 1 MG TABS: 1 | 60 days supply | Qty: 180 | Fill #0

## 2018-07-27 MED FILL — LEFLUNOMIDE 20 MG TABLET: 20 | 30 days supply | Qty: 30 | Fill #0

## 2018-08-13 MED FILL — XELJANZ XR 11 MG TB24: 11 | 30 days supply | Qty: 30 | Fill #2

## 2018-08-14 ENCOUNTER — Ambulatory Visit (HOSPITAL_COMMUNITY)
Admission: RE | Admit: 2018-08-14 | Discharge: 2018-08-14 | Disposition: A | Discharge status: Home / Self Care | Payer: 59 | Source: Ambulatory Visit | Attending: Urology | Admitting: Urology

## 2018-08-14 DIAGNOSIS — N3000 Acute cystitis without hematuria: Secondary | ICD-10-CM | POA: Insufficient documentation

## 2018-08-14 DIAGNOSIS — N184 Chronic kidney disease, stage 4 (severe): Secondary | ICD-10-CM | POA: Diagnosis not present

## 2018-08-27 MED FILL — LEFLUNOMIDE 20 MG TABLET: 20 | 30 days supply | Qty: 30 | Fill #1

## 2018-09-11 DIAGNOSIS — Z6827 Body mass index (BMI) 27.0-27.9, adult: Secondary | ICD-10-CM | POA: Diagnosis not present

## 2018-09-11 DIAGNOSIS — E559 Vitamin D deficiency, unspecified: Secondary | ICD-10-CM | POA: Diagnosis not present

## 2018-09-11 DIAGNOSIS — R5382 Chronic fatigue, unspecified: Secondary | ICD-10-CM | POA: Diagnosis not present

## 2018-09-11 DIAGNOSIS — E663 Overweight: Secondary | ICD-10-CM | POA: Diagnosis not present

## 2018-09-11 DIAGNOSIS — M0579 Rheumatoid arthritis with rheumatoid factor of multiple sites without organ or systems involvement: Secondary | ICD-10-CM | POA: Diagnosis not present

## 2018-09-11 DIAGNOSIS — M7989 Other specified soft tissue disorders: Secondary | ICD-10-CM | POA: Diagnosis not present

## 2018-09-11 DIAGNOSIS — Z79899 Other long term (current) drug therapy: Secondary | ICD-10-CM | POA: Diagnosis not present

## 2018-09-24 ENCOUNTER — Other Ambulatory Visit: Payer: Self-pay | Admitting: Pharmacist

## 2018-09-24 ENCOUNTER — Telehealth: Payer: Self-pay | Admitting: Pharmacist

## 2018-09-24 ENCOUNTER — Ambulatory Visit (INDEPENDENT_AMBULATORY_CARE_PROVIDER_SITE_OTHER): Payer: 59 | Admitting: Pharmacist

## 2018-09-24 DIAGNOSIS — Z79899 Other long term (current) drug therapy: Secondary | ICD-10-CM

## 2018-09-24 MED ORDER — ETANERCEPT 50 MG/ML ~~LOC~~ SOAJ: 50.0000 mg | SUBCUTANEOUS | 3 refills | Status: DC

## 2018-09-24 MED FILL — ENBREL SURECLICK 50 MG/ML S: 50 | 28 days supply | Qty: 4 | Fill #0 | Status: TO

## 2018-09-24 MED FILL — LEFLUNOMIDE 20 MG TABLET: 20 | 30 days supply | Qty: 30 | Fill #0

## 2018-09-24 MED FILL — FOLIC ACID 1 MG TABS: 1 | 60 days supply | Qty: 180 | Fill #0

## 2018-09-24 NOTE — Progress Notes (Signed)
   S: Patient presents to [facility] for review of their specialty medication therapy.  Patient is currently taking Enbrel for rheumatoid arthritis. Patient is managed by Dr. Amil Amen for this.   Patient has now failed [place] and Humira.  Adherence: has not started yet  Efficacy: has not started yet  Dosing:  Ankylosing spondylitis, psoriatic arthritis, rheumatoid arthritis: SubQ: Note: May continue methotrexate, glucocorticoids, salicylates, NSAIDs, or analgesics during etanercept therapy. Once-weekly dosing: 50 mg once weekly; maximum dose (rheumatoid arthritis): 50 mg/week. Twice-weekly dosing (off-label dose): 25 mg twice weekly (Bathon 2000; Calin 2004; Davis 2003; Genovese 2002; Mease 2000; Mease 2002)  Screening: TB test: completed per patient Hepatitis B: completed per patient  Monitoring: Injection site reactions: has not started S/sx of infections: denies S/sx of malignancy: denies GI upset: has not started    O:     No results found for: WBC, HGB, HCT, MCV, PLT    Chemistry   No results found for: NA, K, CL, CO2, BUN, CREATININE, GLU No results found for: CALCIUM, ALKPHOS, AST, ALT, BILITOT     A/P: 1. Medication review: patient currently prescribed Enbrel after failing Xeljanz and Humira. Reviewed the medication with the patient, including the following: Enbrel (etanercept) binds tumor necrosis factor (TNF) and blocks its interaction with cell surface receptors. TNF plays an important role in the inflammatory processes of many diseases. Patient educated on purpose, proper use and potential adverse effects of Enbrel. Adverse effects include rash, GI upset, increased risk of infection, and injection site reactions. Patients should stop Enbrel if they develop a serious infection. There is a possible increased risk in lymphoma and other malignancies. No recommendations for any changes.    Christella Hartigan, PharmD, BCPS, BCACP, CPP Clinical Pharmacist  Practitioner  [phone]

## 2018-09-24 NOTE — Telephone Encounter (Signed)
Called patient to schedule an appointment for the [facility]. I was unable to reach the patient so I left a HIPAA-compliant message requesting that the patient return my call.   

## 2018-10-16 MED FILL — LEFLUNOMIDE 20 MG TABLET: 20 | 30 days supply | Qty: 30 | Fill #0

## 2018-10-18 ENCOUNTER — Other Ambulatory Visit: Payer: Self-pay

## 2018-10-18 ENCOUNTER — Emergency Department (HOSPITAL_COMMUNITY)
Admission: EM | Admit: 2018-10-18 | Discharge: 2018-10-18 | Disposition: A | Discharge status: Home / Self Care | Payer: 59 | Attending: Emergency Medicine | Admitting: Emergency Medicine

## 2018-10-18 ENCOUNTER — Encounter (HOSPITAL_COMMUNITY): Payer: Self-pay

## 2018-10-18 DIAGNOSIS — Z5321 Procedure and treatment not carried out due to patient leaving prior to being seen by health care provider: Secondary | ICD-10-CM | POA: Insufficient documentation

## 2018-10-18 DIAGNOSIS — R21 Rash and other nonspecific skin eruption: Secondary | ICD-10-CM | POA: Insufficient documentation

## 2018-10-18 NOTE — ED Triage Notes (Signed)
Pt started on Enbrel injections. Took her 3rd injection Monday and broke out on her abdomen with redness and warm to the touch. Pt denies SOB or trouble swallowing.

## 2018-10-24 ENCOUNTER — Encounter: Payer: Self-pay | Admitting: Pharmacist

## 2018-10-24 ENCOUNTER — Ambulatory Visit (INDEPENDENT_AMBULATORY_CARE_PROVIDER_SITE_OTHER): Payer: 59 | Admitting: Pharmacist

## 2018-10-24 ENCOUNTER — Other Ambulatory Visit: Payer: Self-pay

## 2018-10-24 DIAGNOSIS — Z79899 Other long term (current) drug therapy: Secondary | ICD-10-CM

## 2018-10-24 MED ORDER — TOCILIZUMAB 162 MG/0.9ML ~~LOC~~ SOAJ: 162.0000 mg | SUBCUTANEOUS | 3 refills | Status: DC

## 2018-10-24 MED FILL — ACTEMRA ACTPEN 162 MG/0.9ML: 162 | 28 days supply | Qty: 2 | Fill #0

## 2018-10-24 NOTE — Progress Notes (Signed)
   S: Patient presents today for review of their specialty medication.   Patient is currently taking Actemra for rheumatoid arthritis. Patient is managed by Dr. Amil Amen for this.   Adherence: has not started  Efficacy: has not started. Recently failed Enbrel due to allergic reaction (rash on abdomen).  Dosing:  SubQ: <100 kg: 162 mg once every other week; increase to 162 mg once every week based on clinical response.  Dosing adjustments: Renal impairment CrCl <30 mL/minute: no adjustments provided in package insert - has not been studied Hepatic impairment: avoid initiation and adjust dose as needed (see package insert) for hepatoxicity during treatment  Monitoring: S/sx of renal/hepatotoxicity: has not started S/sx of infection: denies S/sx of hypersensitivity: has not started Other adverse effects: has not started   O:     No results found for: WBC, HGB, HCT, MCV, PLT    Chemistry   No results found for: NA, K, CL, CO2, BUN, CREATININE, GLU No results found for: CALCIUM, ALKPHOS, AST, ALT, BILITOT     A/P: 1. Medication review: Patient currently prescribed Actemra for rheumatoid arthritis. Reviewed the medication with the patient, including the following: Actemra is a medication used in the treatment of rheumatoid arthritis. Patient should allow medication to reach room temperature prior to administration. Do not use if particulate matter or discoloration is visible. Rotate injection sites and avoid injecting into moles, scars, or damaged skin. Possible adverse effects include increased cholesterol, hepatotoxicity, injection site reactions, and infusion-related reactions (IV). GI perforation, hematologic effects, hypersensitivity reactions, increased risk of infections and possible increased risk of malignancy. Live vaccinations should be avoided and patient should notify physician if he or she has an infection as Actemra might need to be held. No recommendations for any  changes.  Christella Hartigan, PharmD, BCPS, BCACP, CPP Clinical Pharmacist Practitioner  [phone]

## 2018-11-16 DIAGNOSIS — B029 Zoster without complications: Secondary | ICD-10-CM | POA: Diagnosis not present

## 2018-11-20 DIAGNOSIS — Z79899 Other long term (current) drug therapy: Secondary | ICD-10-CM | POA: Diagnosis not present

## 2018-11-20 DIAGNOSIS — R5382 Chronic fatigue, unspecified: Secondary | ICD-10-CM | POA: Diagnosis not present

## 2018-11-20 DIAGNOSIS — E559 Vitamin D deficiency, unspecified: Secondary | ICD-10-CM | POA: Diagnosis not present

## 2018-11-20 DIAGNOSIS — M7989 Other specified soft tissue disorders: Secondary | ICD-10-CM | POA: Diagnosis not present

## 2018-11-20 DIAGNOSIS — M0579 Rheumatoid arthritis with rheumatoid factor of multiple sites without organ or systems involvement: Secondary | ICD-10-CM | POA: Diagnosis not present

## 2018-11-24 MED FILL — ACTEMRA ACTPEN 162 MG/0.9ML: 162 | 28 days supply | Qty: 2 | Fill #1

## 2018-11-27 MED FILL — FOLIC ACID 1 MG TABS: 1 | 60 days supply | Qty: 180 | Fill #0

## 2018-12-03 MED FILL — LEFLUNOMIDE 20 MG TABLET: 20 | 30 days supply | Qty: 30 | Fill #0

## 2019-01-01 MED FILL — LEFLUNOMIDE 20 MG TABLET: 20 | 30 days supply | Qty: 30 | Fill #0

## 2019-01-17 ENCOUNTER — Other Ambulatory Visit: Payer: Self-pay | Admitting: Pharmacist

## 2019-01-17 MED ORDER — ORENCIA CLICKJECT 125 MG/ML ~~LOC~~ SOAJ: 1.0000 mL | SUBCUTANEOUS | 3 refills | Status: DC

## 2019-01-17 NOTE — Addendum Note (Signed)
Addended by: Rica Mast on: 01/17/2019 04:19 PM   Modules accepted: Orders

## 2019-01-17 NOTE — Telephone Encounter (Signed)
Patient has failed Actemra, Enbrel, Xeljanz, and Humira. Patient's rheumatologist prescribed Orencia. Similar side effect profile to previous medications. Counseled patient on Orencia. No questions or concerns from patient. No recommendations for any changes.

## 2019-01-21 ENCOUNTER — Ambulatory Visit (INDEPENDENT_AMBULATORY_CARE_PROVIDER_SITE_OTHER): Payer: 59 | Admitting: Pharmacist

## 2019-01-21 ENCOUNTER — Encounter: Payer: Self-pay | Admitting: Pharmacist

## 2019-01-21 ENCOUNTER — Other Ambulatory Visit: Payer: Self-pay

## 2019-01-21 DIAGNOSIS — Z79899 Other long term (current) drug therapy: Secondary | ICD-10-CM

## 2019-01-21 MED FILL — ORENCIA CLICKJECT 125 MG/ML: 125 | 28 days supply | Qty: 4 | Fill #0

## 2019-01-21 NOTE — Progress Notes (Signed)
   S: Patient presents for review of their specialty medication therapy.  Patient is currently taking Orencia for rheumatoid arthritis. Patient is managed by Dr. Amil Amen for this.   Adherence: has not started yet  FDA-approved dosing: SubQ: 125 mg subQ once weekly. If initiating with an IV loading dose, administer the initial IV infusion per weight based dosing, then administer 125 mg subQ within 24 hours of infusion, followed by 125 mg subQ once weekly thereafter. If transitioning from IV therapy to subQ therapy: administer the first subQ dose instead of the next scheduled IV dose.   Dose adjustments: Renal impairment: none Hepatic impairment: none Toxicity: discontinue if serious infection develops   Screenings: TB screening: negative per patient Hepatitis Screening: negative per patient Blood glucose: Orencia contains maltose which make falsely elevate glucose levels  Monitoring: S/sx of infection: denies S/sx of hypersensitivity: has not started med Other adverse effects: has not started med  O:     No results found for: WBC, HGB, HCT, MCV, PLT    Chemistry   No results found for: NA, K, CL, CO2, BUN, CREATININE, GLU No results found for: CALCIUM, ALKPHOS, AST, ALT, BILITOT     A/P: 1. Medication review: Patient currently on Mulino for the treatment of rheumatoid arthritis but has not started it yet. Reviewed the medication with the patient, including the following: Orencia is a selective T-cell costimulation blocker indicated for rheumatoid arthritis. Patient educated on purpose, proper use and potential adverse effects of Orencia. The most common adverse effects are infections, headache, and injection site reactions. There is a possible adverse effect of increased risk of malignancy but it is not fully understood if this is due to the drug or the disease state itself. The patient was instructed to avoid use of live vaccinations without the approval of a physician. IV:  Infuse over 30 minutes. Administer through a 0.2 to 1.2 micron low protein-binding filter. SubQ: Allow prefilled syringe and autoinjector to warm to room temperature (for 30 to 60 minutes and 30 minutes, respectively) prior to administration. Inject into the front of the thigh (preferred), abdomen (except for 2-inch area around the navel), or the outer area of the upper arms (if administered by a caregiver). Rotate injection sites (?1 inch apart); do not administer into tender, bruised, red, or hard skin. No recommendations for any changes.  Christella Hartigan, PharmD, BCPS, BCACP, CPP Clinical Pharmacist Practitioner  [phone]

## 2019-02-05 MED FILL — LEFLUNOMIDE 20 MG TABLET: 20 | 30 days supply | Qty: 30 | Fill #1

## 2019-02-15 MED FILL — ORENCIA CLICKJECT 125 MG/ML: 125 | 28 days supply | Qty: 4 | Fill #1

## 2019-02-22 MED FILL — FOLIC ACID 1 MG TABS: 1 | 60 days supply | Qty: 180 | Fill #0

## 2019-03-08 MED FILL — LEFLUNOMIDE 20 MG TABLET: 20 | 30 days supply | Qty: 30 | Fill #2

## 2019-03-12 DIAGNOSIS — M7989 Other specified soft tissue disorders: Secondary | ICD-10-CM | POA: Diagnosis not present

## 2019-03-12 DIAGNOSIS — Z79899 Other long term (current) drug therapy: Secondary | ICD-10-CM | POA: Diagnosis not present

## 2019-03-12 DIAGNOSIS — E559 Vitamin D deficiency, unspecified: Secondary | ICD-10-CM | POA: Diagnosis not present

## 2019-03-12 DIAGNOSIS — R5382 Chronic fatigue, unspecified: Secondary | ICD-10-CM | POA: Diagnosis not present

## 2019-03-12 DIAGNOSIS — E663 Overweight: Secondary | ICD-10-CM | POA: Diagnosis not present

## 2019-03-12 DIAGNOSIS — M0579 Rheumatoid arthritis with rheumatoid factor of multiple sites without organ or systems involvement: Secondary | ICD-10-CM | POA: Diagnosis not present

## 2019-03-12 DIAGNOSIS — Z6827 Body mass index (BMI) 27.0-27.9, adult: Secondary | ICD-10-CM | POA: Diagnosis not present

## 2019-03-18 MED FILL — ORENCIA CLICKJECT 125 MG/ML: 125 | 28 days supply | Qty: 4 | Fill #2

## 2019-04-03 MED FILL — LEFLUNOMIDE 20 MG TABLET: 20 | 30 days supply | Qty: 30 | Fill #3

## 2019-04-11 MED FILL — FOLIC ACID 1 MG TABS: 1 | 60 days supply | Qty: 180 | Fill #0

## 2019-04-15 MED FILL — ORENCIA CLICKJECT 125 MG/ML: 125 | 28 days supply | Qty: 4 | Fill #3

## 2019-05-10 MED FILL — LEFLUNOMIDE 20 MG TABLET: 20 | 30 days supply | Qty: 30 | Fill #0

## 2019-05-14 ENCOUNTER — Other Ambulatory Visit: Payer: Self-pay | Admitting: Pharmacist

## 2019-05-14 MED ORDER — ORENCIA CLICKJECT 125 MG/ML ~~LOC~~ SOAJ: 1.0000 mL | SUBCUTANEOUS | 3 refills | Status: DC

## 2019-05-14 MED FILL — ORENCIA CLICKJECT 125 MG/ML: 125 | 28 days supply | Qty: 4 | Fill #0

## 2019-06-10 MED FILL — FOLIC ACID 1 MG TABS: 1 | 60 days supply | Qty: 180 | Fill #0

## 2019-06-10 MED FILL — ORENCIA CLICKJECT 125 MG/ML: 125 | 28 days supply | Qty: 4 | Fill #1

## 2019-06-10 MED FILL — LEFLUNOMIDE 20 MG TABLET: 20 | 30 days supply | Qty: 30 | Fill #0

## 2019-07-01 DIAGNOSIS — Z79899 Other long term (current) drug therapy: Secondary | ICD-10-CM | POA: Diagnosis not present

## 2019-07-01 DIAGNOSIS — M0579 Rheumatoid arthritis with rheumatoid factor of multiple sites without organ or systems involvement: Secondary | ICD-10-CM | POA: Diagnosis not present

## 2019-07-01 DIAGNOSIS — M7989 Other specified soft tissue disorders: Secondary | ICD-10-CM | POA: Diagnosis not present

## 2019-07-01 DIAGNOSIS — R5382 Chronic fatigue, unspecified: Secondary | ICD-10-CM | POA: Diagnosis not present

## 2019-07-01 DIAGNOSIS — Z6827 Body mass index (BMI) 27.0-27.9, adult: Secondary | ICD-10-CM | POA: Diagnosis not present

## 2019-07-01 DIAGNOSIS — E559 Vitamin D deficiency, unspecified: Secondary | ICD-10-CM | POA: Diagnosis not present

## 2019-07-01 DIAGNOSIS — E663 Overweight: Secondary | ICD-10-CM | POA: Diagnosis not present

## 2019-07-17 MED FILL — ORENCIA CLICKJECT 125 MG/ML: 125 | 28 days supply | Qty: 4 | Fill #0

## 2019-07-22 MED FILL — LEFLUNOMIDE 20 MG TABLET: 20 | 30 days supply | Qty: 30 | Fill #1

## 2019-08-12 MED FILL — ORENCIA CLICKJECT 125 MG/ML: 125 | 28 days supply | Qty: 4 | Fill #1

## 2019-08-26 MED FILL — LEFLUNOMIDE 20 MG TABLET: 20 | 30 days supply | Qty: 30 | Fill #2

## 2019-08-27 MED FILL — FOLIC ACID 1 MG TABS: 1 | 60 days supply | Qty: 180 | Fill #0

## 2019-09-05 ENCOUNTER — Other Ambulatory Visit: Payer: Self-pay | Admitting: Pharmacist

## 2019-09-05 ENCOUNTER — Other Ambulatory Visit: Payer: Self-pay | Admitting: Internal Medicine

## 2019-09-05 MED ORDER — ORENCIA CLICKJECT 125 MG/ML ~~LOC~~ SOAJ: 1.0000 mL | SUBCUTANEOUS | 1 refills | Status: DC

## 2019-09-09 MED FILL — ORENCIA CLICKJECT 125 MG/ML: 125 | 28 days supply | Qty: 4 | Fill #0

## 2019-09-23 MED FILL — LEFLUNOMIDE 20 MG TABLET: 20 | 30 days supply | Qty: 30 | Fill #3

## 2019-10-03 MED FILL — ORENCIA CLICKJECT 125 MG/ML: 125 | 28 days supply | Qty: 4 | Fill #1

## 2019-10-22 DIAGNOSIS — E559 Vitamin D deficiency, unspecified: Secondary | ICD-10-CM | POA: Diagnosis not present

## 2019-10-22 DIAGNOSIS — R5382 Chronic fatigue, unspecified: Secondary | ICD-10-CM | POA: Diagnosis not present

## 2019-10-22 DIAGNOSIS — Z6827 Body mass index (BMI) 27.0-27.9, adult: Secondary | ICD-10-CM | POA: Diagnosis not present

## 2019-10-22 DIAGNOSIS — Z79899 Other long term (current) drug therapy: Secondary | ICD-10-CM | POA: Diagnosis not present

## 2019-10-22 DIAGNOSIS — E663 Overweight: Secondary | ICD-10-CM | POA: Diagnosis not present

## 2019-10-22 DIAGNOSIS — M0579 Rheumatoid arthritis with rheumatoid factor of multiple sites without organ or systems involvement: Secondary | ICD-10-CM | POA: Diagnosis not present

## 2019-10-23 ENCOUNTER — Ambulatory Visit (HOSPITAL_BASED_OUTPATIENT_CLINIC_OR_DEPARTMENT_OTHER): Payer: 59 | Admitting: Pharmacist

## 2019-10-23 ENCOUNTER — Other Ambulatory Visit: Payer: Self-pay

## 2019-10-23 DIAGNOSIS — Z79899 Other long term (current) drug therapy: Secondary | ICD-10-CM

## 2019-10-23 MED ORDER — RINVOQ 15 MG PO TB24: 15.0000 mg | ORAL_TABLET | Freq: Every day | ORAL | 2 refills | Status: DC

## 2019-10-23 NOTE — Progress Notes (Signed)
  S: Patient presents today for review of their specialty medication.   Patient is currently taking Rinvoq for rheumatoid arthritis. Patient is managed by Dr. Amil Amen for this.   Dosing: Adult  Note: May be used as monotherapy or in combination with methotrexate or other nonbiologic disease-modifying antirheumatic drugs (DMARDs); use in combination with biologic DMARDS or potent immunosuppressants (eg, azathioprine, cyclosporine) is not recommended. Do not initiate therapy in patients with an absolute lymphocyte count <500/mm3, ANC <1,000/mm3, or hemoglobin <8 g/dL. Rheumatoid arthritis: Oral: 15 mg once daily.  Adherence:  Has not started  Efficacy: has not started   Monitoring: S/sx thromboembolism: has not started; counseling given S/sx malignancy: has not started; counseling given S/sx of infection: has not started; counseling given  Current adverse effects: none    O: Blood work is followed by Dr. Amil Amen per patient.   No results found for: WBC, HGB, HCT, MCV, PLT    Chemistry   No results found for: NA, K, CL, CO2, BUN, CREATININE, GLU No results found for: CALCIUM, ALKPHOS, AST, ALT, BILITOT     No results found for: CHOL, HDL, LDLCALC, LDLDIRECT, TRIG, CHOLHDL   A/P: 1. Medication review: patient currently prescribed Rinvoq for rheumatoid arthritis. Reviewed the medication with the patient, including the following: Rinvoq is a medication used to treat rheumatoid arthritis. Administer with or without food. Swallow tablet whole; do not crush, split, or chew. Possible adverse effects include increased risk of infection, GI upset, hematologic toxicity, hepatic effects, lipid abnormalities, increased risk of malignancy, thromboembolism. Avoid live vaccinations. No recommendations for any changes.  Benard Halsted, PharmD, [place] [phone]

## 2019-10-23 NOTE — Addendum Note (Signed)
Addended by: Daisy Blossom, Annie Main L on: 10/23/2019 02:26 PM   Modules accepted: Orders

## 2019-10-28 MED FILL — LEFLUNOMIDE 20 MG TABLET: 20 | 30 days supply | Qty: 30 | Fill #0

## 2019-10-28 MED FILL — RINVOQ 15 MG TB24: 15 | 30 days supply | Qty: 30 | Fill #0

## 2019-10-29 MED FILL — FOLIC ACID 1 MG TABS: 1 | 60 days supply | Qty: 180 | Fill #0

## 2019-11-25 MED FILL — LEFLUNOMIDE 20 MG TABLET: 20 | 30 days supply | Qty: 30 | Fill #1

## 2019-11-25 MED FILL — RINVOQ 15 MG TB24: 15 | 30 days supply | Qty: 30 | Fill #1

## 2019-12-19 MED FILL — LEFLUNOMIDE 20 MG TABLET: 20 | 30 days supply | Qty: 30 | Fill #0 | Status: TO

## 2019-12-19 MED FILL — RINVOQ 15 MG TB24: 15 | 30 days supply | Qty: 30 | Fill #2

## 2019-12-20 MED FILL — LEFLUNOMIDE 20 MG TABLET: 20 | 30 days supply | Qty: 30 | Fill #0

## 2019-12-24 MED FILL — FOLIC ACID 1 MG TABS: 1 | 60 days supply | Qty: 180 | Fill #0

## 2020-01-16 ENCOUNTER — Other Ambulatory Visit: Payer: Self-pay | Admitting: Internal Medicine

## 2020-01-17 ENCOUNTER — Other Ambulatory Visit: Payer: Self-pay | Admitting: Pharmacist

## 2020-01-17 MED ORDER — RINVOQ 15 MG PO TB24: 15.0000 mg | ORAL_TABLET | Freq: Every day | ORAL | 2 refills | Status: DC

## 2020-01-20 MED FILL — RINVOQ 15 MG TB24: 15 | 30 days supply | Qty: 30 | Fill #0

## 2020-01-31 MED FILL — LEFLUNOMIDE 20 MG TABLET: 20 | 30 days supply | Qty: 30 | Fill #0

## 2020-02-11 DIAGNOSIS — M791 Myalgia, unspecified site: Secondary | ICD-10-CM | POA: Diagnosis not present

## 2020-02-11 DIAGNOSIS — Z6826 Body mass index (BMI) 26.0-26.9, adult: Secondary | ICD-10-CM | POA: Diagnosis not present

## 2020-02-11 DIAGNOSIS — Z79899 Other long term (current) drug therapy: Secondary | ICD-10-CM | POA: Diagnosis not present

## 2020-02-11 DIAGNOSIS — R5382 Chronic fatigue, unspecified: Secondary | ICD-10-CM | POA: Diagnosis not present

## 2020-02-11 DIAGNOSIS — E663 Overweight: Secondary | ICD-10-CM | POA: Diagnosis not present

## 2020-02-11 DIAGNOSIS — E559 Vitamin D deficiency, unspecified: Secondary | ICD-10-CM | POA: Diagnosis not present

## 2020-02-11 DIAGNOSIS — M0579 Rheumatoid arthritis with rheumatoid factor of multiple sites without organ or systems involvement: Secondary | ICD-10-CM | POA: Diagnosis not present

## 2020-02-13 MED FILL — RINVOQ 15 MG TB24: 15 | 30 days supply | Qty: 30 | Fill #1

## 2020-02-24 MED FILL — LEFLUNOMIDE 20 MG TABLET: 20 | 30 days supply | Qty: 30 | Fill #0

## 2020-02-24 MED FILL — FOLIC ACID 1 MG TABS: 1 | 60 days supply | Qty: 180 | Fill #0

## 2020-03-24 DIAGNOSIS — Z79899 Other long term (current) drug therapy: Secondary | ICD-10-CM | POA: Diagnosis not present

## 2020-03-24 DIAGNOSIS — M791 Myalgia, unspecified site: Secondary | ICD-10-CM | POA: Diagnosis not present

## 2020-03-24 DIAGNOSIS — Z6826 Body mass index (BMI) 26.0-26.9, adult: Secondary | ICD-10-CM | POA: Diagnosis not present

## 2020-03-24 DIAGNOSIS — E663 Overweight: Secondary | ICD-10-CM | POA: Diagnosis not present

## 2020-03-24 DIAGNOSIS — E559 Vitamin D deficiency, unspecified: Secondary | ICD-10-CM | POA: Diagnosis not present

## 2020-03-24 DIAGNOSIS — M0579 Rheumatoid arthritis with rheumatoid factor of multiple sites without organ or systems involvement: Secondary | ICD-10-CM | POA: Diagnosis not present

## 2020-03-26 MED FILL — RINVOQ 15 MG TB24: 15 | 30 days supply | Qty: 30 | Fill #2

## 2020-03-31 MED FILL — LEFLUNOMIDE 20 MG TABLET: 20 | 30 days supply | Qty: 30 | Fill #0

## 2020-04-27 ENCOUNTER — Other Ambulatory Visit (HOSPITAL_COMMUNITY): Payer: Self-pay | Admitting: Internal Medicine

## 2020-04-27 MED FILL — LEFLUNOMIDE 20 MG TABLET: 20 | 30 days supply | Qty: 30 | Fill #0

## 2020-04-27 MED FILL — FOLIC ACID 1 MG TABS: 1 | 60 days supply | Qty: 180 | Fill #0

## 2020-05-15 ENCOUNTER — Ambulatory Visit (HOSPITAL_BASED_OUTPATIENT_CLINIC_OR_DEPARTMENT_OTHER): Payer: 59 | Admitting: Pharmacist

## 2020-05-15 ENCOUNTER — Other Ambulatory Visit: Payer: Self-pay

## 2020-05-15 ENCOUNTER — Other Ambulatory Visit: Payer: Self-pay | Admitting: Internal Medicine

## 2020-05-15 DIAGNOSIS — Z79899 Other long term (current) drug therapy: Secondary | ICD-10-CM

## 2020-05-15 MED ORDER — XELJANZ XR 11 MG PO TB24: ORAL_TABLET | ORAL | 3 refills | Status: DC

## 2020-05-15 NOTE — Progress Notes (Signed)
S: Patient presents for review of their specialty medication therapy.  Patient is currently taking Xeljanz for RA. Patient is managed by Leafy Kindle for this.   Adherence: has not yet started  Efficacy: has not yet started. She has tried and failed Rinvoq.   Dosing:  Rheumatoid arthritis (monotherapy or in combination with nonbiologic disease-modifying antirheumatic drugs (DMARDs): Oral: Extended release: 11 mg once daily Note: When transitioning from immediate release to extended release, begin extended release the day following the last dose of 5 mg immediate release. Tofacitinib should not be used in combination with biologic DMARDs or with strong immunosuppressants, such as azathioprine, tacrolimus, or cyclosporine. Do not initiate therapy in patients with an absolute lymphocyte count <500 cells/mm3, absolute neutrophil count <1,000 cells/mm3, or hemoglobin <9 g/dL. Dosage adjustment for strong CYP3A4 inducers (eg, rifampin): Coadministration is not recommended Dosage adjustment for strong CYP3A4 inhibitors (eg, ketoconazole): Reduce dose to 5 mg (immediate release) once daily (the use of the extended release formulation is not recommended) Dosage adjustment for concomitant moderate CYP3A4 inhibitors and potent CYP2C19 inhibitors (eg, fluconazole): Reduce dose to 5 mg (immediate release) once daily Ulcerative colitis, moderate to severe (induction and maintenance) (off-label use): Oral: Immediate release: Induction: 10 mg twice daily; Maintenance: 5 or 10 mg twice daily Richardean Canal 2017)  Dosing: Renal Impairment  Mild impairment: No dosage adjustment necessary. Moderate to severe impairment: Reduce dose to 5 mg (immediate release) once daily. A supplemental dose after dialysis is not necessary in patients with severe impairment on dialysis. Note: Tofacitinib has not been studied in patients with baseline CrCl <40 mL/minute. Dosing: Hepatic Impairment  Mild impairment: No dosage  adjustment necessary. Moderate impairment: Reduce dose to 5 mg (immediate release) once daily. Severe impairment: Use is not recommended (has not been studied in patients with severe hepatic impairment or in patients with hepatitis B or hepatitis C viruses).   Dosing: Adjustment for Toxicity  Lymphopenia (lymphocytes ?500 cells/mm3): Maintain dose. Lymphopenia (lymphocytes <500 cells/mm3) confirmed by repeat evaluation: Discontinue therapy. Neutropenia (ANC >1,000 cells/mm3): Maintain dose. Neutropenia (ANC persistently between 500 to 1,000 cells/mm3): Interrupt therapy; resume at 5 mg immediate release twice daily or 11 mg extended release once daily when ANC >1,000 cells/mm3. Neutropenia (ANC <500 cells/mm3) confirmed by repeat evaluation: Discontinue therapy. Anemia (hemoglobin ?9 g/dL and decrease ?2 g/dL): Maintain dose. Anemia (hemoglobin <8 g/dL or decrease >2 g/dL) confirmed by repeat evaluation: Interrupt therapy until hemoglobin values have normalized.  Monitoring: S/sx of infection: none; counseling given S/sx of malignancy: none; counseling given Bone marrow suppression: monitored by RA per pt; WNL  GI adverse effects: none; counseling given Headache: none; counseling given LFTs: monitored by RA per pt; WNL  Cardiovascular effects (decreased HR): none; counseling given S/sx blood clots: none; counseling given   O:     No results found for: WBC, HGB, HCT, MCV, PLT    Chemistry   No results found for: NA, K, CL, CO2, BUN, CREATININE, GLU No results found for: CALCIUM, ALKPHOS, AST, ALT, BILITOT     A/P: 1. Medication review: patient currently prescribed Xeljanz for RA. Reviewed the medications with the patient including the following: Xeljanz (tofacitinib) is a Janus Associated Kinase Inhibitor which prevents cytokine- or growth factor-medicated gene expression and intracellular activity of immune cells, reduces circulating CD16/56+ natural killer cells, serum IgG, IgM,  IgA, and C-reactive protein, and increases B cells. Patient educated on purpose, proper use and potential adverse effects of Xeljanz.  Adverse effects include increased risk of infection, risk  of malignancy, as well as bone marrow suppression, GI upset, and decreased heart rate. Patient should avoid live vaccinations while on this medication. The FDA has issued a safety alert warning about an increased risk of blood clots and death with the 10 mg twice-daily dose of Harley Alto XR (tofacitinib), which is used in patients with ulcerative colitis. The approved use of tofacitinib for ulcerative colitis will be limited to certain patients who are not treated effectively or who experience severe side effects with certain other medicines. New warnings about these risks, including a Boxed Warning, have been added to the Harley Alto XR prescribing information. No recommendations for changes at this time.  Benard Halsted, PharmD, [place] [phone]

## 2020-05-16 MED FILL — XELJANZ XR 11 MG TB24: 11 | 30 days supply | Qty: 30 | Fill #0

## 2020-05-26 ENCOUNTER — Other Ambulatory Visit (HOSPITAL_COMMUNITY): Payer: Self-pay | Admitting: Internal Medicine

## 2020-05-26 MED FILL — LEFLUNOMIDE 20 MG TABLET: 20 | 30 days supply | Qty: 30 | Fill #0

## 2020-06-03 ENCOUNTER — Other Ambulatory Visit (HOSPITAL_COMMUNITY): Payer: Self-pay | Admitting: Physician Assistant

## 2020-06-03 DIAGNOSIS — M791 Myalgia, unspecified site: Secondary | ICD-10-CM | POA: Diagnosis not present

## 2020-06-03 DIAGNOSIS — M0579 Rheumatoid arthritis with rheumatoid factor of multiple sites without organ or systems involvement: Secondary | ICD-10-CM | POA: Diagnosis not present

## 2020-06-03 DIAGNOSIS — E559 Vitamin D deficiency, unspecified: Secondary | ICD-10-CM | POA: Diagnosis not present

## 2020-06-03 DIAGNOSIS — Z79899 Other long term (current) drug therapy: Secondary | ICD-10-CM | POA: Diagnosis not present

## 2020-06-03 DIAGNOSIS — Z6826 Body mass index (BMI) 26.0-26.9, adult: Secondary | ICD-10-CM | POA: Diagnosis not present

## 2020-06-03 DIAGNOSIS — E663 Overweight: Secondary | ICD-10-CM | POA: Diagnosis not present

## 2020-06-03 MED FILL — predniSONE 5 MG TABS: 5 | 12 days supply | Qty: 48 | Fill #0

## 2020-06-11 MED FILL — XELJANZ XR 11 MG TB24: 11 | 30 days supply | Qty: 30 | Fill #1

## 2020-06-29 ENCOUNTER — Other Ambulatory Visit (HOSPITAL_COMMUNITY): Payer: Self-pay | Admitting: Internal Medicine

## 2020-06-29 MED FILL — FOLIC ACID 1 MG TABS: 1 | 60 days supply | Qty: 180 | Fill #0

## 2020-06-29 MED FILL — LEFLUNOMIDE 20 MG TABLET: 20 | 30 days supply | Qty: 30 | Fill #0

## 2020-07-08 MED FILL — XELJANZ XR 11 MG TB24: 11 | 30 days supply | Qty: 30 | Fill #2

## 2020-07-20 ENCOUNTER — Other Ambulatory Visit (HOSPITAL_COMMUNITY): Payer: Self-pay | Admitting: Internal Medicine

## 2020-07-27 MED FILL — LEFLUNOMIDE 20 MG TABLET: 20 | 30 days supply | Qty: 30 | Fill #0

## 2020-08-06 MED FILL — XELJANZ XR 11 MG TB24: 11 | 30 days supply | Qty: 30 | Fill #3

## 2020-08-24 ENCOUNTER — Other Ambulatory Visit (HOSPITAL_COMMUNITY): Payer: Self-pay | Admitting: Internal Medicine

## 2020-08-24 MED FILL — FOLIC ACID 1 MG TABS: 1 | 60 days supply | Qty: 180 | Fill #0

## 2020-08-24 MED FILL — LEFLUNOMIDE 20 MG TABLET: 20 | 30 days supply | Qty: 30 | Fill #0

## 2020-09-03 ENCOUNTER — Other Ambulatory Visit: Payer: Self-pay | Admitting: Pharmacist

## 2020-09-03 MED ORDER — XELJANZ XR 11 MG PO TB24: ORAL_TABLET | ORAL | 0 refills | Status: DC

## 2020-09-07 MED FILL — XELJANZ XR 11 MG TB24: 11 | 30 days supply | Qty: 30 | Fill #0

## 2020-09-28 DIAGNOSIS — R208 Other disturbances of skin sensation: Secondary | ICD-10-CM | POA: Diagnosis not present

## 2020-09-28 DIAGNOSIS — B078 Other viral warts: Secondary | ICD-10-CM | POA: Diagnosis not present

## 2020-09-28 DIAGNOSIS — L918 Other hypertrophic disorders of the skin: Secondary | ICD-10-CM | POA: Diagnosis not present

## 2020-09-28 DIAGNOSIS — C44519 Basal cell carcinoma of skin of other part of trunk: Secondary | ICD-10-CM | POA: Diagnosis not present

## 2020-09-28 DIAGNOSIS — B372 Candidiasis of skin and nail: Secondary | ICD-10-CM | POA: Diagnosis not present

## 2020-10-01 ENCOUNTER — Other Ambulatory Visit (HOSPITAL_COMMUNITY): Payer: Self-pay | Admitting: Internal Medicine

## 2020-10-01 DIAGNOSIS — M0579 Rheumatoid arthritis with rheumatoid factor of multiple sites without organ or systems involvement: Secondary | ICD-10-CM | POA: Diagnosis not present

## 2020-10-01 DIAGNOSIS — Z6827 Body mass index (BMI) 27.0-27.9, adult: Secondary | ICD-10-CM | POA: Diagnosis not present

## 2020-10-01 DIAGNOSIS — E538 Deficiency of other specified B group vitamins: Secondary | ICD-10-CM | POA: Diagnosis not present

## 2020-10-01 DIAGNOSIS — E663 Overweight: Secondary | ICD-10-CM | POA: Diagnosis not present

## 2020-10-01 DIAGNOSIS — M791 Myalgia, unspecified site: Secondary | ICD-10-CM | POA: Diagnosis not present

## 2020-10-01 DIAGNOSIS — R5383 Other fatigue: Secondary | ICD-10-CM | POA: Diagnosis not present

## 2020-10-01 DIAGNOSIS — E559 Vitamin D deficiency, unspecified: Secondary | ICD-10-CM | POA: Diagnosis not present

## 2020-10-01 DIAGNOSIS — Z79899 Other long term (current) drug therapy: Secondary | ICD-10-CM | POA: Diagnosis not present

## 2020-10-01 MED FILL — LEFLUNOMIDE 20 MG TABLET: 20 | 90 days supply | Qty: 90 | Fill #0

## 2020-10-05 ENCOUNTER — Other Ambulatory Visit (HOSPITAL_COMMUNITY): Payer: Self-pay | Admitting: Internal Medicine

## 2020-10-06 ENCOUNTER — Other Ambulatory Visit: Payer: Self-pay | Admitting: Pharmacist

## 2020-10-06 MED ORDER — XELJANZ XR 11 MG PO TB24: ORAL_TABLET | ORAL | 1 refills | Status: DC

## 2020-10-16 ENCOUNTER — Other Ambulatory Visit (HOSPITAL_COMMUNITY): Payer: Self-pay

## 2020-10-22 ENCOUNTER — Telehealth: Payer: Self-pay | Admitting: Pharmacist

## 2020-10-22 NOTE — Telephone Encounter (Signed)
Called patient to schedule an appointment for the [employer]. I was unable to reach the patient so I left a HIPAA-compliant message requesting that the patient return my call.   Luke Van Ausdall, PharmD, BCACP, CPP Clinical Pharmacist [facility] [phone]  

## 2020-10-23 ENCOUNTER — Ambulatory Visit: Payer: 59 | Attending: Family Medicine | Admitting: Pharmacist

## 2020-10-23 ENCOUNTER — Telehealth: Payer: Self-pay

## 2020-10-23 ENCOUNTER — Other Ambulatory Visit: Payer: Self-pay

## 2020-10-23 DIAGNOSIS — Z79899 Other long term (current) drug therapy: Secondary | ICD-10-CM

## 2020-10-23 MED ORDER — KEVZARA 200 MG/1.14ML ~~LOC~~ SOAJ: SUBCUTANEOUS | 5 refills | Status: DC

## 2020-10-23 NOTE — Telephone Encounter (Signed)
Copied from [place] [id number]. Topic: General - Other >> Oct 22, 2020  1:14 PM [name], [place] C wrote: Reason for CRM: pt is calling in returning Luke's call.  Pt says that she has to work at 3.

## 2020-10-23 NOTE — Telephone Encounter (Signed)
Patient seen.

## 2020-10-23 NOTE — Progress Notes (Signed)
  S: Patient presents today for review of their specialty medication.   Patient is currently taking Kevzara for RA. Patient is managed by Dr. Amil Amen for this. She has previously tried and failed [place], Rinvoq, Enbrel, and Humira.   Dosing: 200 mg once every two weeks given as a subcutaneous injection. Reduce dose to 150 mg once every two weeks for management of neutropenia, thrombocytopenia and elevated liver enzymes.  Adherence: has not yet started  Efficacy: has not yet started  Monitoring:  S/sx of infection: none S/sx of bone marrow suppression: none; has not yet started S/sx hypersensitivity reactions: none; has not yet started  Current adverse effects: none; has not yet started   O: No results found for: WBC, HGB, HCT, MCV, PLT    Chemistry   No results found for: NA, K, CL, CO2, BUN, CREATININE, GLU No results found for: CALCIUM, ALKPHOS, AST, ALT, BILITOT     A/P: 1. Medication review: patient currently prescribed Kevzara for rheumatoid arthritis. Reviewed the medication with the patient including the following: KEVZARA is indicated for treatment of adult patients with moderately to severely active rheumatoid arthritis (RA) who have had an inadequate response or intolerance to one or more disease-modifying antirheumatic drugs (DMARDs). Allow the pre-filled syringe to sit at room temperature for 30 minutes prior to subcutaneous injection. Do not warm KEVZARA in any other way. Rotate injection sites with each injection. Do not inject into skin that is tender, damaged, or has bruises or scars. Do not use if the solution is cloudy, discolored or contains particles, or if any part of the pre-filled syringe appears to be damaged. Possible adverse effects include increased risk of infection, neutropenia, thrombocytopenia, elevated LFTs, lipid abnormalities, GI perforation, hypersensitivity reactions, and injection site reactions. Avoid live vaccinations. No recommendations for any  changes.   Benard Halsted, PharmD, [place], [place] [phone]

## 2020-10-26 ENCOUNTER — Other Ambulatory Visit (HOSPITAL_COMMUNITY): Payer: Self-pay

## 2020-10-27 ENCOUNTER — Other Ambulatory Visit: Payer: Self-pay | Admitting: Pharmacist

## 2020-10-28 ENCOUNTER — Other Ambulatory Visit (HOSPITAL_COMMUNITY): Payer: Self-pay

## 2020-10-29 ENCOUNTER — Other Ambulatory Visit (HOSPITAL_COMMUNITY): Payer: Self-pay

## 2020-10-29 ENCOUNTER — Other Ambulatory Visit: Payer: Self-pay | Admitting: Pharmacist

## 2020-10-29 MED ORDER — KEVZARA 200 MG/1.14ML ~~LOC~~ SOAJ
SUBCUTANEOUS | 5 refills | Status: DC
  Filled 2020-10-29: qty 2.28, fill #0
  Filled 2020-10-29: qty 2.28, 28d supply, fill #0
  Filled 2020-11-17: qty 2.28, 28d supply, fill #1
  Filled 2020-12-23: qty 2.28, 28d supply, fill #2
  Filled 2021-01-13: qty 2.28, 28d supply, fill #3
  Filled 2021-02-11: qty 2.28, 28d supply, fill #4
  Filled 2021-03-17: qty 2.28, 28d supply, fill #5

## 2020-10-30 ENCOUNTER — Other Ambulatory Visit (HOSPITAL_COMMUNITY): Payer: Self-pay

## 2020-11-02 ENCOUNTER — Other Ambulatory Visit (HOSPITAL_COMMUNITY): Payer: Self-pay

## 2020-11-05 ENCOUNTER — Other Ambulatory Visit: Payer: Self-pay

## 2020-11-05 ENCOUNTER — Other Ambulatory Visit (HOSPITAL_COMMUNITY): Payer: Self-pay

## 2020-11-10 ENCOUNTER — Other Ambulatory Visit (HOSPITAL_COMMUNITY): Payer: Self-pay

## 2020-11-11 DIAGNOSIS — B078 Other viral warts: Secondary | ICD-10-CM | POA: Diagnosis not present

## 2020-11-11 DIAGNOSIS — Z08 Encounter for follow-up examination after completed treatment for malignant neoplasm: Secondary | ICD-10-CM | POA: Diagnosis not present

## 2020-11-11 DIAGNOSIS — Z85828 Personal history of other malignant neoplasm of skin: Secondary | ICD-10-CM | POA: Diagnosis not present

## 2020-11-17 ENCOUNTER — Other Ambulatory Visit (HOSPITAL_COMMUNITY): Payer: Self-pay

## 2020-11-25 ENCOUNTER — Other Ambulatory Visit (HOSPITAL_COMMUNITY): Payer: Self-pay

## 2020-12-23 ENCOUNTER — Other Ambulatory Visit (HOSPITAL_COMMUNITY): Payer: Self-pay

## 2020-12-23 MED FILL — Leflunomide Tab 20 MG: ORAL | 90 days supply | Qty: 90 | Fill #0 | Status: AC

## 2020-12-24 ENCOUNTER — Other Ambulatory Visit (HOSPITAL_COMMUNITY): Payer: Self-pay

## 2021-01-07 DIAGNOSIS — M0579 Rheumatoid arthritis with rheumatoid factor of multiple sites without organ or systems involvement: Secondary | ICD-10-CM | POA: Diagnosis not present

## 2021-01-07 DIAGNOSIS — M791 Myalgia, unspecified site: Secondary | ICD-10-CM | POA: Diagnosis not present

## 2021-01-07 DIAGNOSIS — E663 Overweight: Secondary | ICD-10-CM | POA: Diagnosis not present

## 2021-01-07 DIAGNOSIS — R5383 Other fatigue: Secondary | ICD-10-CM | POA: Diagnosis not present

## 2021-01-07 DIAGNOSIS — Z79899 Other long term (current) drug therapy: Secondary | ICD-10-CM | POA: Diagnosis not present

## 2021-01-07 DIAGNOSIS — E538 Deficiency of other specified B group vitamins: Secondary | ICD-10-CM | POA: Diagnosis not present

## 2021-01-07 DIAGNOSIS — E559 Vitamin D deficiency, unspecified: Secondary | ICD-10-CM | POA: Diagnosis not present

## 2021-01-07 DIAGNOSIS — Z6826 Body mass index (BMI) 26.0-26.9, adult: Secondary | ICD-10-CM | POA: Diagnosis not present

## 2021-01-13 ENCOUNTER — Other Ambulatory Visit (HOSPITAL_COMMUNITY): Payer: Self-pay

## 2021-01-14 ENCOUNTER — Other Ambulatory Visit (HOSPITAL_COMMUNITY): Payer: Self-pay

## 2021-01-31 MED FILL — Folic Acid Tab 1 MG: ORAL | 90 days supply | Qty: 270 | Fill #0 | Status: AC

## 2021-02-01 ENCOUNTER — Other Ambulatory Visit (HOSPITAL_COMMUNITY): Payer: Self-pay

## 2021-02-09 ENCOUNTER — Other Ambulatory Visit (HOSPITAL_COMMUNITY): Payer: Self-pay

## 2021-02-11 ENCOUNTER — Other Ambulatory Visit (HOSPITAL_COMMUNITY): Payer: Self-pay

## 2021-02-24 ENCOUNTER — Other Ambulatory Visit (HOSPITAL_COMMUNITY): Payer: Self-pay

## 2021-03-17 ENCOUNTER — Other Ambulatory Visit (HOSPITAL_COMMUNITY): Payer: Self-pay

## 2021-03-19 ENCOUNTER — Other Ambulatory Visit (HOSPITAL_COMMUNITY): Payer: Self-pay | Admitting: Internal Medicine

## 2021-03-19 DIAGNOSIS — Z1231 Encounter for screening mammogram for malignant neoplasm of breast: Secondary | ICD-10-CM

## 2021-03-22 ENCOUNTER — Other Ambulatory Visit: Payer: Self-pay

## 2021-03-22 ENCOUNTER — Ambulatory Visit (HOSPITAL_COMMUNITY)
Admission: RE | Admit: 2021-03-22 | Discharge: 2021-03-22 | Disposition: A | Discharge status: Home / Self Care | Payer: 59 | Source: Ambulatory Visit | Attending: Internal Medicine | Admitting: Internal Medicine

## 2021-03-22 DIAGNOSIS — Z1231 Encounter for screening mammogram for malignant neoplasm of breast: Secondary | ICD-10-CM | POA: Diagnosis not present

## 2021-03-23 ENCOUNTER — Other Ambulatory Visit (HOSPITAL_COMMUNITY): Payer: Self-pay

## 2021-04-05 ENCOUNTER — Other Ambulatory Visit (HOSPITAL_COMMUNITY): Payer: Self-pay

## 2021-04-05 MED ORDER — LEFLUNOMIDE 20 MG PO TABS
20.0000 mg | ORAL_TABLET | Freq: Every day | ORAL | 1 refills | Status: DC
  Filled 2021-04-09: qty 30, 30d supply, fill #0
  Filled 2021-05-10: qty 30, 30d supply, fill #1

## 2021-04-07 ENCOUNTER — Other Ambulatory Visit (HOSPITAL_COMMUNITY): Payer: Self-pay

## 2021-04-08 DIAGNOSIS — M0579 Rheumatoid arthritis with rheumatoid factor of multiple sites without organ or systems involvement: Secondary | ICD-10-CM | POA: Diagnosis not present

## 2021-04-09 ENCOUNTER — Other Ambulatory Visit (HOSPITAL_COMMUNITY): Payer: Self-pay

## 2021-04-16 ENCOUNTER — Other Ambulatory Visit: Payer: Self-pay | Admitting: Pharmacist

## 2021-04-16 ENCOUNTER — Other Ambulatory Visit (HOSPITAL_COMMUNITY): Payer: Self-pay

## 2021-04-16 MED ORDER — FOLIC ACID 1 MG PO TABS
3.0000 mg | ORAL_TABLET | Freq: Every day | ORAL | 1 refills | Status: DC
  Filled 2021-04-16: qty 270, 90d supply, fill #0
  Filled 2021-07-12: qty 270, 90d supply, fill #1

## 2021-04-16 MED ORDER — KEVZARA 200 MG/1.14ML ~~LOC~~ SOAJ
SUBCUTANEOUS | 5 refills | Status: DC
  Filled 2021-04-16: qty 2.28, 28d supply, fill #0

## 2021-04-16 MED ORDER — KEVZARA 200 MG/1.14ML ~~LOC~~ SOAJ
SUBCUTANEOUS | 5 refills | Status: DC
  Filled 2021-04-16 – 2021-04-20 (×2): qty 2.28, 28d supply, fill #0

## 2021-04-20 ENCOUNTER — Other Ambulatory Visit (HOSPITAL_COMMUNITY): Payer: Self-pay

## 2021-04-20 ENCOUNTER — Other Ambulatory Visit: Payer: Self-pay | Admitting: Pharmacist

## 2021-04-20 MED ORDER — KEVZARA 200 MG/1.14ML ~~LOC~~ SOAJ
SUBCUTANEOUS | 6 refills | Status: DC
  Filled 2021-04-20: qty 2.28, 28d supply, fill #0

## 2021-04-20 MED ORDER — KEVZARA 200 MG/1.14ML ~~LOC~~ SOAJ
SUBCUTANEOUS | 6 refills | Status: DC
  Filled 2021-04-20: qty 2.28, fill #0
  Filled 2021-04-23: qty 2.28, 28d supply, fill #0
  Filled 2021-05-10: qty 2.28, 28d supply, fill #1
  Filled 2021-06-07: qty 2.28, 28d supply, fill #2
  Filled 2021-07-12: qty 2.28, 28d supply, fill #3
  Filled 2021-08-09: qty 2.28, 28d supply, fill #4
  Filled 2021-09-06: qty 2.28, 28d supply, fill #5
  Filled 2021-10-04: qty 2.28, 28d supply, fill #6

## 2021-04-21 ENCOUNTER — Other Ambulatory Visit (HOSPITAL_COMMUNITY): Payer: Self-pay

## 2021-04-22 ENCOUNTER — Other Ambulatory Visit (HOSPITAL_COMMUNITY): Payer: Self-pay

## 2021-04-23 ENCOUNTER — Other Ambulatory Visit (HOSPITAL_COMMUNITY): Payer: Self-pay

## 2021-04-27 ENCOUNTER — Other Ambulatory Visit (HOSPITAL_COMMUNITY): Payer: Self-pay

## 2021-05-10 ENCOUNTER — Other Ambulatory Visit (HOSPITAL_COMMUNITY): Payer: Self-pay

## 2021-05-17 ENCOUNTER — Other Ambulatory Visit (HOSPITAL_COMMUNITY): Payer: Self-pay

## 2021-06-07 ENCOUNTER — Other Ambulatory Visit (HOSPITAL_COMMUNITY): Payer: Self-pay

## 2021-06-07 MED ORDER — LEFLUNOMIDE 20 MG PO TABS
20.0000 mg | ORAL_TABLET | Freq: Every day | ORAL | 1 refills | Status: DC
  Filled 2021-06-07: qty 30, 30d supply, fill #0
  Filled 2021-07-12: qty 30, 30d supply, fill #1

## 2021-06-14 ENCOUNTER — Other Ambulatory Visit (HOSPITAL_COMMUNITY): Payer: Self-pay

## 2021-07-12 ENCOUNTER — Other Ambulatory Visit (HOSPITAL_COMMUNITY): Payer: Self-pay

## 2021-07-13 ENCOUNTER — Other Ambulatory Visit (HOSPITAL_COMMUNITY): Payer: Self-pay

## 2021-07-27 ENCOUNTER — Other Ambulatory Visit (HOSPITAL_COMMUNITY): Payer: Self-pay

## 2021-07-27 DIAGNOSIS — E538 Deficiency of other specified B group vitamins: Secondary | ICD-10-CM | POA: Diagnosis not present

## 2021-07-27 DIAGNOSIS — E663 Overweight: Secondary | ICD-10-CM | POA: Diagnosis not present

## 2021-07-27 DIAGNOSIS — E559 Vitamin D deficiency, unspecified: Secondary | ICD-10-CM | POA: Diagnosis not present

## 2021-07-27 DIAGNOSIS — M0579 Rheumatoid arthritis with rheumatoid factor of multiple sites without organ or systems involvement: Secondary | ICD-10-CM | POA: Diagnosis not present

## 2021-07-27 DIAGNOSIS — R5383 Other fatigue: Secondary | ICD-10-CM | POA: Diagnosis not present

## 2021-07-27 DIAGNOSIS — M791 Myalgia, unspecified site: Secondary | ICD-10-CM | POA: Diagnosis not present

## 2021-07-27 DIAGNOSIS — Z6826 Body mass index (BMI) 26.0-26.9, adult: Secondary | ICD-10-CM | POA: Diagnosis not present

## 2021-07-27 DIAGNOSIS — Z79899 Other long term (current) drug therapy: Secondary | ICD-10-CM | POA: Diagnosis not present

## 2021-07-27 MED ORDER — SULFASALAZINE 500 MG PO TABS
500.0000 mg | ORAL_TABLET | Freq: Every day | ORAL | 2 refills | Status: DC
  Filled 2021-07-28: qty 60, 60d supply, fill #0
  Filled 2021-09-06: qty 60, 60d supply, fill #1

## 2021-07-28 ENCOUNTER — Other Ambulatory Visit (HOSPITAL_COMMUNITY): Payer: Self-pay

## 2021-08-09 ENCOUNTER — Other Ambulatory Visit (HOSPITAL_COMMUNITY): Payer: Self-pay

## 2021-08-11 ENCOUNTER — Other Ambulatory Visit (HOSPITAL_COMMUNITY): Payer: Self-pay

## 2021-08-11 MED ORDER — LEFLUNOMIDE 20 MG PO TABS
20.0000 mg | ORAL_TABLET | Freq: Every day | ORAL | 1 refills | Status: DC
  Filled 2021-08-11: qty 30, 30d supply, fill #0
  Filled 2021-09-06: qty 30, 30d supply, fill #1

## 2021-08-12 ENCOUNTER — Other Ambulatory Visit (HOSPITAL_COMMUNITY): Payer: Self-pay

## 2021-09-06 ENCOUNTER — Other Ambulatory Visit (HOSPITAL_COMMUNITY): Payer: Self-pay

## 2021-09-07 ENCOUNTER — Other Ambulatory Visit (HOSPITAL_COMMUNITY): Payer: Self-pay

## 2021-09-09 ENCOUNTER — Other Ambulatory Visit (HOSPITAL_COMMUNITY): Payer: Self-pay

## 2021-09-14 ENCOUNTER — Other Ambulatory Visit (HOSPITAL_COMMUNITY): Payer: Self-pay

## 2021-09-27 DIAGNOSIS — M0579 Rheumatoid arthritis with rheumatoid factor of multiple sites without organ or systems involvement: Secondary | ICD-10-CM | POA: Diagnosis not present

## 2021-10-04 ENCOUNTER — Other Ambulatory Visit (HOSPITAL_COMMUNITY): Payer: Self-pay

## 2021-10-05 ENCOUNTER — Other Ambulatory Visit (HOSPITAL_COMMUNITY): Payer: Self-pay

## 2021-10-05 MED ORDER — FOLIC ACID 1 MG PO TABS
3.0000 mg | ORAL_TABLET | Freq: Every day | ORAL | 1 refills | Status: AC
  Filled 2021-10-05: qty 270, 90d supply, fill #0

## 2021-10-05 MED ORDER — LEFLUNOMIDE 20 MG PO TABS
20.0000 mg | ORAL_TABLET | Freq: Every day | ORAL | 1 refills | Status: DC
  Filled 2021-10-05: qty 30, 30d supply, fill #0
  Filled 2021-10-29: qty 30, 30d supply, fill #1

## 2021-10-06 ENCOUNTER — Other Ambulatory Visit (HOSPITAL_COMMUNITY): Payer: Self-pay

## 2021-10-28 ENCOUNTER — Other Ambulatory Visit (HOSPITAL_COMMUNITY): Payer: Self-pay

## 2021-10-29 ENCOUNTER — Other Ambulatory Visit (HOSPITAL_COMMUNITY): Payer: Self-pay

## 2021-10-30 ENCOUNTER — Other Ambulatory Visit (HOSPITAL_COMMUNITY): Payer: Self-pay

## 2021-11-01 ENCOUNTER — Ambulatory Visit: Payer: 59 | Attending: Internal Medicine | Admitting: Pharmacist

## 2021-11-01 ENCOUNTER — Other Ambulatory Visit (HOSPITAL_COMMUNITY): Payer: Self-pay

## 2021-11-01 DIAGNOSIS — Z79899 Other long term (current) drug therapy: Secondary | ICD-10-CM

## 2021-11-01 MED ORDER — KEVZARA 200 MG/1.14ML ~~LOC~~ SOAJ
SUBCUTANEOUS | 6 refills | Status: AC
Start: 2021-11-01 — End: ?
  Filled 2021-11-01: qty 2.28, 28d supply, fill #0

## 2021-11-01 MED ORDER — KEVZARA 200 MG/1.14ML ~~LOC~~ SOAJ: SUBCUTANEOUS | 6 refills | Status: DC

## 2021-11-01 NOTE — Progress Notes (Signed)
?  S: ?Patient presents today for review of their specialty medication.  ? ?Patient is currently taking Kevzara for RA. Patient is managed by Dr. Amil Amen for this. She has previously tried and failed [place], Rinvoq, Enbrel, and Humira.  ? ?Dosing: 200 mg once every two weeks given as a subcutaneous injection. Reduce dose to 150 mg once every two weeks for management of neutropenia, thrombocytopenia and elevated liver enzymes. ? ?Adherence: confirmed ? ?Efficacy: reports that it works well however she has a reaction for 1-3 days after injecting. Endorses a "bee-sting" like reaction after injection. Denies any symptoms of anaphylaxis or any desire to stop at this time.  ? ?Monitoring:  ?S/sx of infection: none ?S/sx of bone marrow suppression: none ?S/sx hypersensitivity reactions: see above. Stinging pain with injection with some residual erythema around the injection site. No S/sx of anaphylaxis present. Manages with application of ice to the injection site. ? ?O: ?No results found for: WBC, HGB, HCT, MCV, PLT ? ?  Chemistry   ?No results found for: NA, K, CL, CO2, BUN, CREATININE, GLU No results found for: CALCIUM, ALKPHOS, AST, ALT, BILITOT  ? ? ? ?A/P: ?1. Medication review: patient currently taking Kevzara for rheumatoid arthritis. Reviewed the medication with the patient including the following: KEVZARA? is indicated for treatment of adult patients with moderately to severely active rheumatoid arthritis (RA) who have had an inadequate response or intolerance to one or more disease-modifying antirheumatic drugs (DMARDs). Allow the pre-filled syringe to sit at room temperature for 30 minutes prior to subcutaneous injection. Do not warm KEVZARA in any other way. Rotate injection sites with each injection. Do not inject into skin that is tender, damaged, or has bruises or scars. Do not use if the solution is cloudy, discolored or contains particles, or if any part of the pre-filled syringe appears to be damaged.  Possible adverse effects include increased risk of infection, neutropenia, thrombocytopenia, elevated LFTs, lipid abnormalities, GI perforation, hypersensitivity reactions, and injection site reactions. Avoid live vaccinations. No recommendations for any changes.  ? ?Benard Halsted, PharmD, BCACP, CPP ?Clinical Pharmacist ?[place] ?[phone] ? ? ? ?

## 2021-11-02 ENCOUNTER — Other Ambulatory Visit (HOSPITAL_COMMUNITY): Payer: Self-pay

## 2021-11-02 DIAGNOSIS — H5203 Hypermetropia, bilateral: Secondary | ICD-10-CM | POA: Diagnosis not present

## 2021-11-02 MED ORDER — CYCLOSPORINE 0.05 % OP EMUL
OPHTHALMIC | 12 refills | Status: AC
  Filled 2021-11-02: qty 180, 90d supply, fill #0

## 2021-11-03 ENCOUNTER — Other Ambulatory Visit (HOSPITAL_COMMUNITY): Payer: Self-pay

## 2021-11-19 ENCOUNTER — Other Ambulatory Visit (HOSPITAL_COMMUNITY): Payer: Self-pay

## 2021-11-19 MED ORDER — LEFLUNOMIDE 20 MG PO TABS: ORAL_TABLET | ORAL | 0 refills | Status: AC

## 2021-11-23 ENCOUNTER — Other Ambulatory Visit (HOSPITAL_COMMUNITY): Payer: Self-pay

## 2021-12-03 ENCOUNTER — Other Ambulatory Visit (HOSPITAL_COMMUNITY): Payer: Self-pay

## 2021-12-21 ENCOUNTER — Other Ambulatory Visit (HOSPITAL_COMMUNITY): Payer: Self-pay

## 2021-12-23 ENCOUNTER — Other Ambulatory Visit (HOSPITAL_COMMUNITY): Payer: Self-pay

## 2021-12-24 ENCOUNTER — Other Ambulatory Visit (HOSPITAL_COMMUNITY): Payer: Self-pay

## 2021-12-29 ENCOUNTER — Other Ambulatory Visit (HOSPITAL_COMMUNITY): Payer: Self-pay

## 2022-01-26 DIAGNOSIS — M0579 Rheumatoid arthritis with rheumatoid factor of multiple sites without organ or systems involvement: Secondary | ICD-10-CM | POA: Diagnosis not present

## 2022-01-26 DIAGNOSIS — E559 Vitamin D deficiency, unspecified: Secondary | ICD-10-CM | POA: Diagnosis not present

## 2022-01-26 DIAGNOSIS — Z6826 Body mass index (BMI) 26.0-26.9, adult: Secondary | ICD-10-CM | POA: Diagnosis not present

## 2022-01-26 DIAGNOSIS — E538 Deficiency of other specified B group vitamins: Secondary | ICD-10-CM | POA: Diagnosis not present

## 2022-01-26 DIAGNOSIS — Z79899 Other long term (current) drug therapy: Secondary | ICD-10-CM | POA: Diagnosis not present

## 2022-01-26 DIAGNOSIS — M791 Myalgia, unspecified site: Secondary | ICD-10-CM | POA: Diagnosis not present

## 2022-01-26 DIAGNOSIS — E663 Overweight: Secondary | ICD-10-CM | POA: Diagnosis not present

## 2022-01-26 DIAGNOSIS — R5383 Other fatigue: Secondary | ICD-10-CM | POA: Diagnosis not present

## 2022-04-27 DIAGNOSIS — M0579 Rheumatoid arthritis with rheumatoid factor of multiple sites without organ or systems involvement: Secondary | ICD-10-CM | POA: Diagnosis not present

## 2022-05-12 ENCOUNTER — Other Ambulatory Visit (HOSPITAL_COMMUNITY): Payer: Self-pay | Admitting: Internal Medicine

## 2022-05-12 DIAGNOSIS — Z23 Encounter for immunization: Secondary | ICD-10-CM | POA: Diagnosis not present

## 2022-05-12 DIAGNOSIS — Z1231 Encounter for screening mammogram for malignant neoplasm of breast: Secondary | ICD-10-CM

## 2022-05-30 ENCOUNTER — Ambulatory Visit (HOSPITAL_COMMUNITY)
Admission: RE | Admit: 2022-05-30 | Discharge: 2022-05-30 | Disposition: A | Discharge status: Home / Self Care | Payer: Medicare Other | Source: Ambulatory Visit | Attending: Internal Medicine | Admitting: Internal Medicine

## 2022-05-30 DIAGNOSIS — Z1231 Encounter for screening mammogram for malignant neoplasm of breast: Secondary | ICD-10-CM | POA: Insufficient documentation

## 2022-06-02 DIAGNOSIS — Z111 Encounter for screening for respiratory tuberculosis: Secondary | ICD-10-CM | POA: Diagnosis not present

## 2022-06-02 DIAGNOSIS — M0579 Rheumatoid arthritis with rheumatoid factor of multiple sites without organ or systems involvement: Secondary | ICD-10-CM | POA: Diagnosis not present

## 2022-06-02 DIAGNOSIS — R5382 Chronic fatigue, unspecified: Secondary | ICD-10-CM | POA: Diagnosis not present

## 2022-06-02 DIAGNOSIS — Z79899 Other long term (current) drug therapy: Secondary | ICD-10-CM | POA: Diagnosis not present

## 2022-06-02 DIAGNOSIS — R5383 Other fatigue: Secondary | ICD-10-CM | POA: Diagnosis not present

## 2022-06-07 DIAGNOSIS — M069 Rheumatoid arthritis, unspecified: Secondary | ICD-10-CM | POA: Diagnosis not present

## 2022-06-07 DIAGNOSIS — N393 Stress incontinence (female) (male): Secondary | ICD-10-CM | POA: Diagnosis not present

## 2022-06-07 DIAGNOSIS — Z79899 Other long term (current) drug therapy: Secondary | ICD-10-CM | POA: Diagnosis not present

## 2022-06-09 ENCOUNTER — Encounter (HOSPITAL_COMMUNITY): Payer: Self-pay | Admitting: Internal Medicine

## 2022-06-09 DIAGNOSIS — Z0000 Encounter for general adult medical examination without abnormal findings: Secondary | ICD-10-CM | POA: Diagnosis not present

## 2022-06-09 DIAGNOSIS — Z23 Encounter for immunization: Secondary | ICD-10-CM | POA: Diagnosis not present

## 2022-06-09 DIAGNOSIS — R030 Elevated blood-pressure reading, without diagnosis of hypertension: Secondary | ICD-10-CM | POA: Diagnosis not present

## 2022-06-09 DIAGNOSIS — F17200 Nicotine dependence, unspecified, uncomplicated: Secondary | ICD-10-CM

## 2022-06-09 DIAGNOSIS — M069 Rheumatoid arthritis, unspecified: Secondary | ICD-10-CM | POA: Diagnosis not present

## 2022-06-21 ENCOUNTER — Encounter (HOSPITAL_COMMUNITY): Payer: Self-pay | Admitting: Internal Medicine

## 2022-06-21 DIAGNOSIS — F17209 Nicotine dependence, unspecified, with unspecified nicotine-induced disorders: Secondary | ICD-10-CM

## 2022-06-21 DIAGNOSIS — Z122 Encounter for screening for malignant neoplasm of respiratory organs: Secondary | ICD-10-CM

## 2022-06-21 DIAGNOSIS — Z801 Family history of malignant neoplasm of trachea, bronchus and lung: Secondary | ICD-10-CM

## 2022-06-21 DIAGNOSIS — F17200 Nicotine dependence, unspecified, uncomplicated: Secondary | ICD-10-CM

## 2022-06-23 ENCOUNTER — Ambulatory Visit (INDEPENDENT_AMBULATORY_CARE_PROVIDER_SITE_OTHER): Payer: Medicare Other | Admitting: Podiatry

## 2022-06-23 ENCOUNTER — Ambulatory Visit (INDEPENDENT_AMBULATORY_CARE_PROVIDER_SITE_OTHER): Payer: Medicare Other

## 2022-06-23 DIAGNOSIS — E539 Vitamin B deficiency, unspecified: Secondary | ICD-10-CM

## 2022-06-23 DIAGNOSIS — G629 Polyneuropathy, unspecified: Secondary | ICD-10-CM | POA: Diagnosis not present

## 2022-06-23 DIAGNOSIS — M79672 Pain in left foot: Secondary | ICD-10-CM | POA: Diagnosis not present

## 2022-06-23 DIAGNOSIS — M79671 Pain in right foot: Secondary | ICD-10-CM

## 2022-06-23 DIAGNOSIS — M21619 Bunion of unspecified foot: Secondary | ICD-10-CM

## 2022-06-23 MED ORDER — GABAPENTIN 100 MG PO CAPS: 100.0000 mg | ORAL_CAPSULE | Freq: Three times a day (TID) | ORAL | 3 refills | Status: DC

## 2022-06-23 NOTE — Patient Instructions (Signed)
Start with gabapentin '100mg'$  at night. After a few days if no side affects starting taking one in the morning then one at night. If needed you can go up to taking 1 tablet ('100mg'$ ) three times a day.   Gabapentin Capsules or Tablets What is this medication? GABAPENTIN (GA ba pen tin) treats nerve pain. It may also be used to prevent and control seizures in people with epilepsy. It works by calming overactive nerves in your body. This medicine may be used for other purposes; ask your health care provider or pharmacist if you have questions. COMMON BRAND NAME(S): Active-PAC with Gabapentin, Orpha Bur, Gralise, Neurontin What should I tell my care team before I take this medication? They need to know if you have any of these conditions: Alcohol or substance use disorder Kidney disease Lung or breathing disease Suicidal thoughts, plans, or attempt; a previous suicide attempt by you or a family member An unusual or allergic reaction to gabapentin, other medications, foods, dyes, or preservatives Pregnant or trying to get pregnant Breast-feeding How should I use this medication? Take this medication by mouth with a glass of water. Follow the directions on the prescription label. You can take it with or without food. If it upsets your stomach, take it with food. Take your medication at regular intervals. Do not take it more often than directed. Do not stop taking except on your care team's advice. If you are directed to break the 600 or 800 mg tablets in half as part of your dose, the extra half tablet should be used for the next dose. If you have not used the extra half tablet within 28 days, it should be thrown away. A special MedGuide will be given to you by the pharmacist with each prescription and refill. Be sure to read this information carefully each time. Talk to your care team about the use of this medication in children. While this medication may be prescribed for children as young as 3 years for  selected conditions, precautions do apply. Overdosage: If you think you have taken too much of this medicine contact a poison control center or emergency room at once. NOTE: This medicine is only for you. Do not share this medicine with others. What if I miss a dose? If you miss a dose, take it as soon as you can. If it is almost time for your next dose, take only that dose. Do not take double or extra doses. What may interact with this medication? Alcohol Antihistamines for allergy, cough, and cold Certain medications for anxiety or sleep Certain medications for depression like amitriptyline, fluoxetine, sertraline Certain medications for seizures like phenobarbital, primidone Certain medications for stomach problems General anesthetics like halothane, isoflurane, methoxyflurane, propofol Local anesthetics like lidocaine, pramoxine, tetracaine Medications that relax muscles for surgery Opioid medications for pain Phenothiazines like chlorpromazine, mesoridazine, prochlorperazine, thioridazine This list may not describe all possible interactions. Give your health care provider a list of all the medicines, herbs, non-prescription drugs, or dietary supplements you use. Also tell them if you smoke, drink alcohol, or use illegal drugs. Some items may interact with your medicine. What should I watch for while using this medication? Visit your care team for regular checks on your progress. You may want to keep a record at home of how you feel your condition is responding to treatment. You may want to share this information with your care team at each visit. You should contact your care team if your seizures get worse or if you  have any new types of seizures. Do not stop taking this medication or any of your seizure medications unless instructed by your care team. Stopping your medication suddenly can increase your seizures or their severity. This medication may cause serious skin reactions. They can  happen weeks to months after starting the medication. Contact your care team right away if you notice fevers or flu-like symptoms with a rash. The rash may be red or purple and then turn into blisters or peeling of the skin. Or, you might notice a red rash with swelling of the face, lips or lymph nodes in your neck or under your arms. Wear a medical identification bracelet or chain if you are taking this medication for seizures. Carry a card that lists all your medications. This medication may affect your coordination, reaction time, or judgment. Do not drive or operate machinery until you know how this medication affects you. Sit up or stand slowly to reduce the risk of dizzy or fainting spells. Drinking alcohol with this medication can increase the risk of these side effects. Your mouth may get dry. Chewing sugarless gum or sucking hard candy, and drinking plenty of water may help. Watch for new or worsening thoughts of suicide or depression. This includes sudden changes in mood, behaviors, or thoughts. These changes can happen at any time but are more common in the beginning of treatment or after a change in dose. Call your care team right away if you experience these thoughts or worsening depression. If you become pregnant while using this medication, you may enroll in the [place] Pregnancy Registry by calling [phone]. This registry collects information about the safety of antiepileptic medication use during pregnancy. What side effects may I notice from receiving this medication? Side effects that you should report to your care team as soon as possible: Allergic reactions or angioedema--skin rash, itching, hives, swelling of the face, eyes, lips, tongue, arms, or legs, trouble swallowing or breathing Rash, fever, and swollen lymph nodes Thoughts of suicide or self harm, worsening mood, feelings of depression Trouble breathing Unusual changes in mood or behavior in  children after use such as difficulty concentrating, hostility, or restlessness Side effects that usually do not require medical attention (report to your care team if they continue or are bothersome): Dizziness Drowsiness Nausea Swelling of ankles, feet, or hands Vomiting This list may not describe all possible side effects. Call your doctor for medical advice about side effects. You may report side effects to FDA at 1-800-FDA-1088. Where should I keep my medication? Keep out of reach of children and pets. Store at room temperature between 15 and 30 degrees C (59 and 86 degrees F). Get rid of any unused medication after the expiration date. This medication may cause accidental overdose and death if taken by other adults, children, or pets. To get rid of medications that are no longer needed or have expired: Take the medication to a medication take-back program. Check with your pharmacy or law enforcement to find a location. If you cannot return the medication, check the label or package insert to see if the medication should be thrown out in the garbage or flushed down the toilet. If you are not sure, ask your care team. If it is safe to put it in the trash, empty the medication out of the container. Mix the medication with cat litter, dirt, coffee grounds, or other unwanted substance. Seal the mixture in a bag or container. Put it in the trash. NOTE:  This sheet is a summary. It may not cover all possible information. If you have questions about this medicine, talk to your doctor, pharmacist, or health care provider.  2023 Elsevier/Gold Standard (2021-01-11 00:00:00)

## 2022-06-23 NOTE — Progress Notes (Signed)
Subjective:   Patient ID: [name], female   DOB: 66 y.o.   MRN: [id number]   HPI Chief Complaint  Patient presents with   Numbness    Patient came in today for numbness in the toes, top and ball of the foot, started 6 months ago, pins and needles, bilateral Bunions, X-rays-done today    66 year old female presents for above concerns.  This started about 6-9 months ago when she first noticed it.  At the time she did not change any medications and no injuries.  She notices more when she is sitting down or laying down.  She does not feel as much when she is walking.  She does get tingling.  Symptoms are same on both feet and symmetrical.  She sees Dr. Marijean Bravo for RA.  Not diabetic No etoh use.    Review of Systems  All other systems reviewed and are negative.  Past Medical History:  Diagnosis Date   Complication of anesthesia    PO nausea and vomiting   Rheumatoid arthritis ([place])     Past Surgical History:  Procedure Laterality Date   CESAREAN SECTION     COLONOSCOPY N/A 01/27/2016   Procedure: COLONOSCOPY;  Surgeon: Daneil Dolin, MD;  Location: [facility];  Service: Endoscopy;  Laterality: N/A;  8:30 AM - moved to 8:45 - office to notify   TUBAL LIGATION       Current Outpatient Medications:    gabapentin (NEURONTIN) 100 MG capsule, Take 1 capsule (100 mg total) by mouth 3 (three) times daily., Disp: 90 capsule, Rfl: 3   cycloSPORINE (RESTASIS) 0.05 % ophthalmic emulsion, Place 1 drop into both eyes twice a day, Disp: 180 each, Rfl: 12   folic acid (FOLVITE) 1 MG tablet, Take 1 mg by mouth daily. Takes 3 tablets daily, Disp: , Rfl:    folic acid (FOLVITE) 1 MG tablet, TAKE 3 TABLETS BY MOUTH ONCE A DAY, Disp: 270 tablet, Rfl: 1   leflunomide (ARAVA) 20 MG tablet, TAKE 1 TABLET BY MOUTH ONCE DAILY, Disp: 90 tablet, Rfl: 1   leflunomide (ARAVA) 20 MG tablet, TAKE 1 TABLET BY MOUTH ONCE DAILY, Disp: 30 tablet, Rfl: 0   leflunomide (ARAVA) 20 MG tablet, TAKE 1 TABLET  BY MOUTH ONCE DAILY, Disp: 30 tablet, Rfl: 0   leflunomide (ARAVA) 20 MG tablet, TAKE 1 TABLET BY MOUTH ONCE DAILY., Disp: 30 tablet, Rfl: 0   leflunomide (ARAVA) 20 MG tablet, TAKE 1 TABLET BY MOUTH ONCE DAILY., Disp: 30 tablet, Rfl: 0   leflunomide (ARAVA) 20 MG tablet, TAKE 1 TABLET BY MOUTH ONCE DAILY, Disp: 30 tablet, Rfl: 0   leflunomide (ARAVA) 20 MG tablet, 1 tablet Orally Once a day 90 days, Disp: 90 tablet, Rfl: 0   methotrexate (RHEUMATREX) 2.5 MG tablet, Take 2.5 mg by mouth once a week. Caution:Chemotherapy. Protect from light.  PT TAKES 6 TABLETS ONCE A WEEK, Disp: , Rfl:    polyethylene glycol-electrolytes (TRILYTE) 420 g solution, Take 4,000 mLs by mouth as directed., Disp: 4000 mL, Rfl: 0   Sarilumab (KEVZARA) 200 MG/1.14ML SOAJ, Inject '200mg'$  subcutaneous every 2 weeks., Disp: 2.28 mL, Rfl: 6   sulfaSALAzine (AZULFIDINE) 500 MG tablet, Take 1 tablet (500 mg total) by mouth daily., Disp: 60 tablet, Rfl: 2  No Known Allergies         Objective:  Physical Exam  General: AAO x3, NAD  Dermatological: Skin is warm, dry and supple bilateral.  There are no open sores, no preulcerative  lesions, no rash or signs of infection present.  Vascular: Dorsalis Pedis artery and Posterior Tibial artery pedal pulses are 2/4 bilateral with immedate capillary fill time. There is no pain with calf compression, swelling, warmth, erythema.   Neruologic: Sensation appears to be intact with Thornell Mule monofilament.  Musculoskeletal: Bunions present.  There is no area pinpoint tenderness noted.  No sign of edema there is no erythema.  Flexor, extensor tendons appear to be intact.  MMT 5/5.  Gait: Unassisted, Nonantalgic.       Assessment:   Concern for neuropathy; rheumatoid arthritis     Plan:  -Treatment options discussed including all alternatives, risks, and complications -Etiology of symptoms were discussed -X-rays were obtained and reviewed with the patient.  Bunion is  present.  There is no evidence of acute fracture.  Decreased calcaneal inclination angle.  Calcaneal spurring is present. -I think her symptoms are more consistent with neuropathy.  Restart gabapentin discussed side effects of medication and she wants to start this.  We can increase the dose as tolerated.  She is already been evaluated for autoimmune issues and she has rheumatoid arthritis.  She is not diabetic.  No check B6 and B12 deficiencies.  Consider neurology evaluation. -She has bunions as well as flatfeet we discussed shoe modifications, inserts to help with that to see if that can also help with her symptoms.  However her pain is not with walking especially at the end of the day.  Trula Slade DPM

## 2022-06-24 ENCOUNTER — Other Ambulatory Visit: Payer: Self-pay | Admitting: Podiatry

## 2022-06-24 DIAGNOSIS — M79672 Pain in left foot: Secondary | ICD-10-CM

## 2022-06-24 DIAGNOSIS — E539 Vitamin B deficiency, unspecified: Secondary | ICD-10-CM

## 2022-06-27 ENCOUNTER — Other Ambulatory Visit (HOSPITAL_COMMUNITY): Payer: Self-pay | Admitting: Internal Medicine

## 2022-06-27 DIAGNOSIS — Z122 Encounter for screening for malignant neoplasm of respiratory organs: Secondary | ICD-10-CM

## 2022-06-27 DIAGNOSIS — Z87891 Personal history of nicotine dependence: Secondary | ICD-10-CM

## 2022-06-27 LAB — VITAMIN B1: Thiamine: 81.8 nmol/L (ref 66.5–200.0)

## 2022-06-27 LAB — VITAMIN B12: Vitamin B-12: 1001 pg/mL (ref 232–1245)

## 2022-07-05 ENCOUNTER — Other Ambulatory Visit: Payer: Self-pay

## 2022-07-05 DIAGNOSIS — M21619 Bunion of unspecified foot: Secondary | ICD-10-CM

## 2022-07-05 MED ORDER — GABAPENTIN 100 MG PO CAPS: 100.0000 mg | ORAL_CAPSULE | Freq: Three times a day (TID) | ORAL | 3 refills | Status: DC

## 2022-07-20 ENCOUNTER — Ambulatory Visit (HOSPITAL_COMMUNITY)
Admission: RE | Admit: 2022-07-20 | Discharge: 2022-07-20 | Disposition: A | Discharge status: Home / Self Care | Payer: Medicare Other | Source: Ambulatory Visit | Attending: Internal Medicine | Admitting: Internal Medicine

## 2022-07-20 DIAGNOSIS — Z87891 Personal history of nicotine dependence: Secondary | ICD-10-CM | POA: Insufficient documentation

## 2022-07-20 DIAGNOSIS — Z122 Encounter for screening for malignant neoplasm of respiratory organs: Secondary | ICD-10-CM | POA: Diagnosis not present

## 2022-07-20 DIAGNOSIS — F17210 Nicotine dependence, cigarettes, uncomplicated: Secondary | ICD-10-CM | POA: Diagnosis not present

## 2022-07-21 DIAGNOSIS — I10 Essential (primary) hypertension: Secondary | ICD-10-CM | POA: Diagnosis not present

## 2022-08-01 DIAGNOSIS — E559 Vitamin D deficiency, unspecified: Secondary | ICD-10-CM | POA: Diagnosis not present

## 2022-08-01 DIAGNOSIS — E538 Deficiency of other specified B group vitamins: Secondary | ICD-10-CM | POA: Diagnosis not present

## 2022-08-01 DIAGNOSIS — Z79899 Other long term (current) drug therapy: Secondary | ICD-10-CM | POA: Diagnosis not present

## 2022-08-01 DIAGNOSIS — R5383 Other fatigue: Secondary | ICD-10-CM | POA: Diagnosis not present

## 2022-08-01 DIAGNOSIS — G629 Polyneuropathy, unspecified: Secondary | ICD-10-CM | POA: Diagnosis not present

## 2022-08-01 DIAGNOSIS — E663 Overweight: Secondary | ICD-10-CM | POA: Diagnosis not present

## 2022-08-01 DIAGNOSIS — M791 Myalgia, unspecified site: Secondary | ICD-10-CM | POA: Diagnosis not present

## 2022-08-01 DIAGNOSIS — Z6826 Body mass index (BMI) 26.0-26.9, adult: Secondary | ICD-10-CM | POA: Diagnosis not present

## 2022-08-01 DIAGNOSIS — M0579 Rheumatoid arthritis with rheumatoid factor of multiple sites without organ or systems involvement: Secondary | ICD-10-CM | POA: Diagnosis not present

## 2022-08-25 DIAGNOSIS — E663 Overweight: Secondary | ICD-10-CM | POA: Diagnosis not present

## 2022-08-25 DIAGNOSIS — I10 Essential (primary) hypertension: Secondary | ICD-10-CM | POA: Diagnosis not present

## 2022-09-19 ENCOUNTER — Ambulatory Visit: Payer: Medicare Other | Admitting: Neurology

## 2022-10-20 ENCOUNTER — Encounter: Payer: Self-pay | Admitting: Neurology

## 2022-10-20 ENCOUNTER — Ambulatory Visit (INDEPENDENT_AMBULATORY_CARE_PROVIDER_SITE_OTHER): Payer: Medicare Other | Admitting: Neurology

## 2022-10-20 VITALS — BP 136/84 | HR 77 | Ht 66.0 in | Wt 161.2 lb

## 2022-10-20 DIAGNOSIS — R7309 Other abnormal glucose: Secondary | ICD-10-CM | POA: Diagnosis not present

## 2022-10-20 DIAGNOSIS — G609 Hereditary and idiopathic neuropathy, unspecified: Secondary | ICD-10-CM | POA: Diagnosis not present

## 2022-10-20 NOTE — Patient Instructions (Addendum)
Ask dr Jacqualyn Posey if he can order vascular tests Check blood work Emg/ncs  Electromyoneurogram Electromyoneurogram is a test to check how well your muscles and nerves are working. This procedure includes the combined use of electromyogram (EMG) and nerve conduction study (NCS). EMG is used to evaluate muscles and the nerves that control those muscles. NCS, which is also called electroneurogram, measures how well your nerves conduct electricity. The procedures should be done together to check if your muscles and nerves are healthy. If the results of the tests are abnormal, this may indicate disease or injury, such as a neuromuscular disease or peripheral nerve damage. Tell a health care provider about: Any allergies you have. All medicines you are taking, including vitamins, herbs, eye drops, creams, and over-the-counter medicines. Any bleeding problems you have. Any surgeries you have had. Any medical conditions you have. What are the risks? Generally, this is a safe procedure. However, problems may occur, including: Bleeding or bruising. Infection where the electrodes were inserted. What happens before the test? Medicines Take all of your usually prescribed medications before this testing is performed. Do not stop your blood thinners unless advised by your prescribing physician. General instructions Your health care provider may ask you to warm the limb that will be checked with warm water, hot pack, or wrapping the limb in a blanket. Do not use lotions or creams on the same day that you will be having the procedure. What happens during the test? For EMG  Your health care provider will ask you to stay in a position so that the muscle being studied can be accessed. You will be sitting or lying down. You may be given a medicine to numb the area (local anesthetic) and the skin will be disinfected. A very thin needle that has an electrode will be inserted into your muscle, one muscle at a time.  Typically, multiple muscles are evaluated during a single study. Another small electrode will be placed on your skin near the muscle. Your health care provider will ask you to continue to remain still. The electrodes will record the electrical activity of your muscles. You may see this on a monitor or hear it in the room. After your muscles have been studied at rest, your health care provider will ask you to contract or flex your muscles. The electrodes will record the electrical activity of your muscles. Your health care provider will remove the electrodes and the electrode needle when the procedure is finished. The procedure may vary among health care providers and hospitals. For NCS  An electrode that records your nerve activity (recording electrode) will be placed on your skin by the muscle that is being studied. An electrode that is used as a reference (reference electrode) will be placed near the recording electrode. A paste or gel will be applied to your skin between the recording electrode and the reference electrode. Your nerve will be stimulated with a mild shock. The speed of the nerves and strength of response is recorded by the electrodes. Your health care provider will remove the electrodes and the gel when the procedure is finished. The procedure may vary among health care providers and hospitals. What can I expect after the test? It is up to you to get your test results. Ask your health care provider, or the department that is doing the test, when your results will be ready. Your health care provider may: Give you medicines for any pain. Monitor the insertion sites to make sure that bleeding stops.  You should be able to drive yourself to and from the test. Discomfort can persist for a few hours after the test, but should be better the next day. Contact a health care provider if: You have swelling, redness, or drainage at any of the insertion sites. Summary Electromyoneurogram  is a test to check how well your muscles and nerves are working. If the results of the tests are abnormal, this may indicate disease or injury. This is a safe procedure. However, problems may occur, such as bleeding and infection. Your health care provider will do two tests to complete this procedure. One checks your muscles (EMG) and another checks your nerves (NCS). It is up to you to get your test results. Ask your health care provider, or the department that is doing the test, when your results will be ready. This information is not intended to replace advice given to you by your health care provider. Make sure you discuss any questions you have with your health care provider. Document Revised: 03/24/2021 Document Reviewed: 02/21/2021 Elsevier Patient Education  [name].    Peripheral Neuropathy Peripheral neuropathy is a type of nerve damage. It affects nerves that carry signals between the spinal cord and the arms, legs, and the rest of the body (peripheral nerves). It does not affect nerves in the spinal cord or brain. In peripheral neuropathy, one nerve or a group of nerves may be damaged. Peripheral neuropathy is a broad category that includes many specific nerve disorders, like diabetic neuropathy, hereditary neuropathy, and carpal tunnel syndrome. What are the causes? This condition may be caused by: Certain diseases, such as: Diabetes. This is the most common cause of peripheral neuropathy. Autoimmune diseases, such as rheumatoid arthritis and systemic lupus erythematosus. Nerve diseases that are passed from parent to child (inherited). Kidney disease. Thyroid disease. Other causes may include: Nerve injury. Pressure or stress on a nerve that lasts a long time. Lack (deficiency) of B vitamins. This can result from alcoholism, poor diet, or a restricted diet. Infections. Some medicines, such as cancer medicines (chemotherapy). Poisonous (toxic) substances, such as  lead and mercury. Too little blood flowing to the legs. In some cases, the cause of this condition is not known. What are the signs or symptoms? Symptoms of this condition depend on which of your nerves is damaged. Symptoms in the legs, hands, and arms can include: Loss of feeling (numbness) in the feet, hands, or both. Tingling in the feet, hands, or both. Burning pain. Very sensitive skin. Weakness. Not being able to move a part of the body (paralysis). Clumsiness or poor coordination. Muscle twitching. Loss of balance. Symptoms in other parts of the body can include: Not being able to control your bladder. Feeling dizzy. Sexual problems. How is this diagnosed? Diagnosing and finding the cause of peripheral neuropathy can be difficult. Your health care provider will take your medical history and do a physical exam. A neurological exam will also be done. This involves checking things that are affected by your brain, spinal cord, and nerves (nervous system). For example, your health care provider will check your reflexes, how you move, and what you can feel. You may have other tests, such as: Blood tests. Electromyogram (EMG) and nerve conduction tests. These tests check nerve function and how well the nerves are controlling the muscles. Imaging tests, such as a CT scan or MRI, to rule out other causes of your symptoms. Removing a small piece of nerve to be examined in a lab (nerve  biopsy). Removing and examining a small amount of the fluid that surrounds the brain and spinal cord (lumbar puncture). How is this treated? Treatment for this condition may involve: Treating the underlying cause of the neuropathy, such as diabetes, kidney disease, or vitamin deficiencies. Stopping medicines that can cause neuropathy, such as chemotherapy. Medicine to help relieve pain. Medicines may include: Prescription or over-the-counter pain medicine. Anti-seizure  medicine. Antidepressants. Pain-relieving patches that are applied to painful areas of skin. Surgery to relieve pressure on a nerve or to destroy a nerve that is causing pain. Physical therapy to help improve movement and balance. Devices to help you move around (assistive devices). Follow these instructions at home: Medicines Take over-the-counter and prescription medicines only as told by your health care provider. Do not take any other medicines without first asking your health care provider. Ask your health care provider if the medicine prescribed to you requires you to avoid driving or using machinery. Lifestyle  Do not use any products that contain nicotine or tobacco. These products include cigarettes, chewing tobacco, and vaping devices, such as e-cigarettes. Smoking keeps blood from reaching damaged nerves. If you need help quitting, ask your health care provider. Avoid or limit alcohol. Too much alcohol can cause a vitamin B deficiency, and vitamin B is needed for healthy nerves. Eat a healthy diet. This includes: Eating foods that are high in fiber, such as beans, whole grains, and fresh fruits and vegetables. Limiting foods that are high in fat and processed sugars, such as fried or sweet foods. General instructions  If you have diabetes, work closely with your health care provider to keep your blood sugar under control. If you have numbness in your feet: Check every day for signs of injury or infection. Watch for redness, warmth, and swelling. Wear padded socks and comfortable shoes. These help protect your feet. Develop a good support system. Living with peripheral neuropathy can be stressful. Consider talking with a mental health specialist or joining a support group. Use assistive devices and attend physical therapy as told by your health care provider. This may include using a walker or a cane. Keep all follow-up visits. This is important. Where to find more  information [employer] of Neurological Disorders: [employer] Contact a health care provider if: You have new signs or symptoms of peripheral neuropathy. You are struggling emotionally from dealing with peripheral neuropathy. Your pain is not well controlled. Get help right away if: You have an injury or infection that is not healing normally. You develop new weakness in an arm or leg. You have fallen or do so frequently. Summary Peripheral neuropathy is when the nerves in the arms or legs are damaged, resulting in numbness, weakness, or pain. There are many causes of peripheral neuropathy, including diabetes, pinched nerves, vitamin deficiencies, autoimmune disease, and hereditary conditions. Diagnosing and finding the cause of peripheral neuropathy can be difficult. Your health care provider will take your medical history, do a physical exam, and do tests, including blood tests and nerve function tests. Treatment involves treating the underlying cause of the neuropathy and taking medicines to help control pain. Physical therapy and assistive devices may also help. This information is not intended to replace advice given to you by your health care provider. Make sure you discuss any questions you have with your health care provider.Peripheral artery disease (PAD) Overview Peripheral artery disease (also called peripheral arterial disease) is a common condition in which narrowed arteries reduce blood flow to the arms or legs.  In peripheral artery disease (PAD), the legs or arms -- usually the legs -- don't receive enough blood flow to keep up with demand. This may cause leg pain when walking (claudication) and other symptoms.  Peripheral artery disease is usually a sign of a buildup of fatty deposits in the arteries (atherosclerosis). Atherosclerosis causes narrowing of the arteries that can reduce blood flow in the legs and, sometimes, the arms.  Peripheral artery disease  treatment includes exercising, eating a healthy diet and not smoking or using tobacco.  Symptoms Many people with peripheral artery disease have mild or no symptoms. Some people have leg pain when walking (claudication).  Claudication symptoms include muscle pain or cramping in the legs or arms that begins during exercise and ends with rest. The pain is most commonly felt in the calf. The pain ranges from mild to severe. Severe leg pain may make it hard to walk or do other types of physical activity.  Other peripheral artery disease symptoms may include:  Coldness in the lower leg or foot, especially when compared with the other side Leg numbness or weakness No pulse or a weak pulse in the legs or feet Painful cramping in one or both of the hips, thighs or calf muscles after certain activities, such as walking or climbing stairs Shiny skin on the legs Skin color changes on the legs Slower growth of the toenails Sores on the toes, feet or legs that won't heal Pain when using the arms, such as aching and cramping when knitting, writing or doing other manual tasks Erectile dysfunction Hair loss or slower hair growth on the legs If peripheral artery disease gets worse, pain may occur during rest or when lying down. The pain may interrupt sleep. Hanging the legs over the edge of the bed or walking may temporarily relieve the pain.  When to see a doctor Call your health care provider if you have leg pain, numbness or other symptoms of peripheral artery disease.  Causes Peripheral artery disease is often caused by a buildup of fatty, cholesterol-containing deposits (plaques) on artery walls. This process is called atherosclerosis. It reduces blood flow through the arteries.  Atherosclerosis affects arteries throughout the body. When it occurs in the arteries supplying blood to the limbs, it causes peripheral artery disease.  Less common causes of peripheral artery disease include:  Blood  vessel inflammation Injury to the arms or legs Changes in the muscles or ligaments Radiation exposure Risk factors Smoking or having diabetes greatly increases the risk of developing peripheral artery disease.Other things that increase the risk of peripheral artery disease include:  A family history of peripheral artery disease, heart disease or stroke High blood pressure High cholesterol High levels of an amino acid called homocysteine, which increase the risk for coronary artery disease Increasing age, especially after 24 (or after 76 if you have risk factors for atherosclerosis) Obesity (a body mass index over 30) Complications Complications of peripheral artery disease caused by atherosclerosis include:  Critical limb ischemia. In this condition, an injury or infection causes tissue to die. Symptoms include open sores on the limbs that don't heal. Treatment may include amputation of the affected limb. Stroke and heart attack. Plaque buildup in the arteries can also affect the blood vessels in the heart and brain. Prevention The best way to prevent leg pain due to peripheral artery disease is to maintain a healthy lifestyle. That means:  Don't smoke. Control blood sugar. Eat foods that are low in saturated fat. Get regular  exercise -- but check with your care provider about what type and how much is best for you. Maintain a healthy weight. Manage blood pressure and cholesterol. By [facility] Staff .[facility] Legal Conditions and Terms Any use of this site constitutes your agreement to the Terms and Conditions and Privacy Policy linked below.  Terms and Conditions Privacy Policy Notice of Privacy Practices Notice of [facility] is a nonprofit organization and proceeds from [employer] help support our mission. [facility] does not endorse any of the third party products and services advertised.  Advertising and  sponsorship policy Advertising and sponsorship opportunities Reprint Permissions A single copy of these materials may be reprinted for noncommercial personal use only. "[facility]," "[facility]," "[employer]," "[place]," and the triple-shield [facility] logo are trademarks of [employer] for [employer] and [employer]. Document Revised: 03/16/2021 Document Reviewed: 03/16/2021 Elsevier Patient Education  [name].

## 2022-10-20 NOTE — Progress Notes (Signed)
[facility]    Provider:  Dr Jaynee Eagles Requesting Provider: Leafy Kindle, PA-C Primary Care Provider:  Asencion Noble, MD  CC:  cold toes  HPI:  [name] is a 67 y.o. female here as requested by Leafy Kindle, PA-C for cold toes. has History of colonic polyps on their problem list. Long history of smoking 1ppd per day for decades. Also Rheumatoid arthritis.   67 y.o. female here as requested by Leafy Kindle, PA-C for numbness and tingling in the toes. .pmhx has History of colonic polyps on their problem list. RA, VitD def, myalgia, b12 deficiency(was negative), neuropathy, overweight. Numbnesss and tingline in the feet. Started 3-5 months ago and slowly worsening. Nothing new. No new medications. Whole forefront of the foot bilaterally. Bunons on both sides. Feel worse in bed and when sitting. Not when walking. No cramps. No low back pain. No falls or imbalance. They are a little red. They feel cold to the touch. She is a long-term smoker. I discussed peripheral artery disease, not only do her feet subjectiveky feel cold to her they are cold to the touch and usually if its nerve damage the feet feel cold but are warm to the touch. We spoke about neuropathy and causes extensively but I suspect this may be the start of PAD I can;t feel a dorsalis pulse.No other focal neurologic deficits, associated symptoms, inciting events or modifiable factors.   Reviewed notes, labs and imaging from outside physicians, which showed:  Recent Results (from the past 2160 hour(s))  Hemoglobin A1c     Status: None   Collection Time: 10/20/22  4:55 PM  Result Value Ref Range   Hgb A1c MFr Bld 5.1 4.8 - 5.6 %    Comment:          Prediabetes: 5.7 - 6.4          Diabetes: >6.4          Glycemic control for adults with diabetes: <7.0    Est. average glucose Bld gHb Est-mCnc 100 mg/dL  Folate     Status: None   Collection Time: 10/20/22  4:55 PM  Result Value Ref Range   Folate  >20.0 >3.0 ng/mL    Comment: A serum folate concentration of less than 3.1 ng/mL is considered to represent clinical deficiency.   Vitamin B6     Status: None   Collection Time: 10/20/22  4:55 PM  Result Value Ref Range   Vitamin B6 17.3 3.4 - 65.2 ug/L    Comment:                              Deficiency:         <3.4                              Marginal:      3.4 - 5.1                              Adequate:           >5.1   Multiple Myeloma Panel (SPEP&IFE w/QIG)     Status: None (Preliminary result)   Collection Time: 10/20/22  4:55 PM  Result Value Ref Range   IgG (Immunoglobin G), Serum WILL FOLLOW    IgA/Immunoglobulin A, Serum WILL FOLLOW    IgM (  Immunoglobulin M), Srm WILL FOLLOW    Total Protein WILL FOLLOW    Albumin SerPl Elph-Mcnc WILL FOLLOW    Alpha 1 WILL FOLLOW    Alpha2 Glob SerPl Elph-Mcnc WILL FOLLOW    B-Globulin SerPl Elph-Mcnc WILL FOLLOW    Gamma Glob SerPl Elph-Mcnc WILL FOLLOW    M Protein SerPl Elph-Mcnc WILL FOLLOW    Globulin, Total WILL FOLLOW    Albumin/Glob SerPl WILL FOLLOW    IFE 1 WILL FOLLOW    Please Note WILL FOLLOW     Reviewed xrays agrees with the following of feet: Bunion is present. There is no evidence of acute fracture. Decreased calcaneal inclination angle. Calcaneal spurring is present.   Review of Systems: Patient complains of symptoms per HPI as well as the following symptoms cold toes. Pertinent negatives and positives per HPI. All others negative.   Social History   Socioeconomic History   Marital status: Married    Spouse name: Not on file   Number of children: Not on file   Years of education: Not on file   Highest education level: Not on file  Occupational History   Not on file  Tobacco Use   Smoking status: Every Day    Packs/day: 1    Types: Cigarettes   Smokeless tobacco: Never  Substance and Sexual Activity   Alcohol use: Never   Drug use: Never   Sexual activity: Yes  Other Topics Concern   Not on file   Social History Narrative   Not on file   Social Determinants of Health   Financial Resource Strain: Not on file  Food Insecurity: Not on file  Transportation Needs: Not on file  Physical Activity: Not on file  Stress: Not on file  Social Connections: Not on file  Intimate Partner Violence: Not on file    Family History  Problem Relation Age of Onset   Colon cancer Maternal Grandmother    Neuropathy Neg Hx     Past Medical History:  Diagnosis Date   Complication of anesthesia    PO nausea and vomiting   Rheumatoid arthritis ([place])     Patient Active Problem List   Diagnosis Date Noted   History of colonic polyps     Past Surgical History:  Procedure Laterality Date   CESAREAN SECTION     COLONOSCOPY N/A 01/27/2016   Procedure: COLONOSCOPY;  Surgeon: Daneil Dolin, MD;  Location: [facility];  Service: Endoscopy;  Laterality: N/A;  8:30 AM - moved to 8:45 - office to notify   TUBAL LIGATION      Current Outpatient Medications  Medication Sig Dispense Refill   cycloSPORINE (RESTASIS) 0.05 % ophthalmic emulsion Place 1 drop into both eyes twice a day 99991111 each 12   folic acid (FOLVITE) 1 MG tablet Take 1 mg by mouth daily. Takes 3 tablets daily     folic acid (FOLVITE) 1 MG tablet TAKE 3 TABLETS BY MOUTH ONCE A DAY 270 tablet 1   leflunomide (ARAVA) 20 MG tablet 1 tablet Orally Once a day 90 days 90 tablet 0   lisinopril (ZESTRIL) 10 MG tablet Take 10 mg by mouth daily.     phentermine 30 MG capsule Take 30 mg by mouth daily.     polyethylene glycol-electrolytes (TRILYTE) 420 g solution Take 4,000 mLs by mouth as directed. 4000 mL 0   Sarilumab (KEVZARA) 200 MG/1.14ML SOAJ Inject 200mg  subcutaneous every 2 weeks. 2.28 mL 6   UNABLE TO FIND Med  Name: Align Probiotic     VITAMIN D PO Take by mouth.     No current facility-administered medications for this visit.    Allergies as of 10/20/2022   (No Known Allergies)    Vitals: BP 136/84   Pulse 77   Ht 5\' 6"   (1.676 m)   Wt 161 lb 3.2 oz (73.1 kg)   BMI 26.02 kg/m  Last Weight:  Wt Readings from Last 1 Encounters:  10/20/22 161 lb 3.2 oz (73.1 kg)   Last Height:   Ht Readings from Last 1 Encounters:  10/20/22 5\' 6"  (1.676 m)     Physical exam: Exam: Gen: NAD, conversant, well nourised,  well groomed                     CV: RRR, no MRG. No Carotid Bruits. No peripheral edema, warm, nontender Eyes: Conjunctivae clear without exudates or hemorrhage Msk: toes feel cold, can;t feel a pulse, some rubor   Neuro: Detailed Neurologic Exam  Speech:    Speech is normal; fluent and spontaneous with normal comprehension.  Cognition:    The patient is oriented to person, place, and time;     recent and remote memory intact;     language fluent;     normal attention, concentration,     fund of knowledge Cranial Nerves:    The pupils are equal, round, and reactive to light. Pupils too small to visualize fundi attemptedVisual fields are full to finger confrontation. Extraocular movements are intact. Trigeminal sensation is intact and the muscles of mastication are normal. The face is symmetric. The palate elevates in the midline. Hearing intact. Voice is normal. Shoulder shrug is normal. The tongue has normal motion without fasciculations.   Coordination:    Normal finger to nose and heel to shin.    Gait: nml  Motor Observation:    No asymmetry, no atrophy, and no involuntary movements noted. Tone:    Normal muscle tone.    Posture:    Posture is normal. normal erect    Strength:    Strength is V/V in the upper and lower limbs.      Sensation: intact to LT, decreased pp to ankle, absent vibration great toe, proprioception intact.      Reflex Exam:  DTR's:    Hypo AJs and patellars normal uppers Toes:    The toes are downgoing bilaterally.   Clonus:    Clonus is absent.    Assessment/Plan:  Feet feel cold and some rubor. She has been smoking since 77 and is now 75, about a  pack a day. Cannot feel dorsalis pedis. Needs a vascular evaluation for mild/beginning peripheral artery disease. No etoh. No DM. Patients with RA can have small fiber neuropathy. Speant a long time reviewing causes of neuropaty an what PAD is and how smoking may affect her. . Discussed the causes of peripheral neuropathy, the most common being diabetes which patient does not report having.. This is a condition that develops as a result of damage to the peripheral nervous system.  There are multiple causes eluding metabolic, toxic, infectious and endocrine disorders, small vessel disease, autoimmune diseases, and others. We'll perform extensive serum neuropathy screening and also order an EMG nerve conduction study.   Ask dr wagoner/fagan if they can order vascular tests: Feet feel cold and some rubor. She has been smoking since 37 and is now 89, about a pack a day. Cannot feel dorsalis pedis. Check blood work for  peripheral neuropathy Emg/ncs for peripheral neuropathy  Message to Dr. Willey Blade and Jacqualyn Posey: Hey Dr. Eugenie Norrie. Willey Blade, you sent this patient to me for neuropathy. I'll work her up for that but did you feel her toes? They actually feel extremely cold and I couldn't feel a dorsalis pulse. Feet feel cold and some rubor of the toes. She has been smoking since 31 and is now 36, about a pack a day. I think she also Needs a vascular evaluation for mild/beginning peripheral artery disease.But I have no idea what to order for that, would either of you order it or tell me what to order? Did you notice this? Thanks, [place] Dom   Orders Placed This Encounter  Procedures   Hemoglobin A1c   Folate   Vitamin B6   Multiple Myeloma Panel (SPEP&IFE w/QIG)   NCV with EMG(electromyography)    Cc: Maylene Roes,  Asencion Noble, MD  Sarina Ill, MD  [facility] [address] [place], [place] [address]  Phone [phone] Fax [phone]  I spent over 60 minutes  of face-to-face and non-face-to-face time with patient on the  1. Idiopathic peripheral neuropathy   2. Elevated glucose    diagnosis.  This included previsit chart review, lab review, study review, order entry, electronic health record documentation, patient education on the different diagnostic and therapeutic options, counseling and coordination of care, risks and benefits of management, compliance, or risk factor reduction

## 2022-10-25 ENCOUNTER — Other Ambulatory Visit (HOSPITAL_COMMUNITY): Payer: Self-pay | Admitting: Internal Medicine

## 2022-10-25 DIAGNOSIS — R202 Paresthesia of skin: Secondary | ICD-10-CM

## 2022-10-25 DIAGNOSIS — R200 Anesthesia of skin: Secondary | ICD-10-CM

## 2022-10-27 LAB — MULTIPLE MYELOMA PANEL, SERUM
Albumin SerPl Elph-Mcnc: 4.3 g/dL (ref 2.9–4.4)
Albumin/Glob SerPl: 1.7 (ref 0.7–1.7)
Alpha 1: 0.2 g/dL (ref 0.0–0.4)
Alpha2 Glob SerPl Elph-Mcnc: 0.5 g/dL (ref 0.4–1.0)
B-Globulin SerPl Elph-Mcnc: 0.9 g/dL (ref 0.7–1.3)
Gamma Glob SerPl Elph-Mcnc: 1 g/dL (ref 0.4–1.8)
Globulin, Total: 2.6 g/dL (ref 2.2–3.9)
IgA/Immunoglobulin A, Serum: 123 mg/dL (ref 87–352)
IgG (Immunoglobin G), Serum: 966 mg/dL (ref 586–1602)
IgM (Immunoglobulin M), Srm: 69 mg/dL (ref 26–217)
Total Protein: 6.9 g/dL (ref 6.0–8.5)

## 2022-10-27 LAB — HEMOGLOBIN A1C
Est. average glucose Bld gHb Est-mCnc: 100 mg/dL
Hgb A1c MFr Bld: 5.1 % (ref 4.8–5.6)

## 2022-10-27 LAB — VITAMIN B6: Vitamin B6: 17.3 ug/L (ref 3.4–65.2)

## 2022-10-27 LAB — FOLATE: Folate: >20 ng/mL (ref >3.0)

## 2022-11-01 ENCOUNTER — Ambulatory Visit (HOSPITAL_COMMUNITY)
Admission: RE | Admit: 2022-11-01 | Discharge: 2022-11-01 | Disposition: A | Discharge status: Home / Self Care | Payer: Medicare Other | Source: Ambulatory Visit | Attending: Internal Medicine | Admitting: Internal Medicine

## 2022-11-01 DIAGNOSIS — R202 Paresthesia of skin: Secondary | ICD-10-CM | POA: Insufficient documentation

## 2022-11-01 DIAGNOSIS — M0579 Rheumatoid arthritis with rheumatoid factor of multiple sites without organ or systems involvement: Secondary | ICD-10-CM | POA: Diagnosis not present

## 2022-11-01 DIAGNOSIS — R200 Anesthesia of skin: Secondary | ICD-10-CM | POA: Diagnosis not present

## 2022-11-21 DIAGNOSIS — I10 Essential (primary) hypertension: Secondary | ICD-10-CM | POA: Diagnosis not present

## 2022-11-21 DIAGNOSIS — E663 Overweight: Secondary | ICD-10-CM | POA: Diagnosis not present

## 2022-12-07 ENCOUNTER — Encounter: Payer: Self-pay | Admitting: Podiatry

## 2022-12-07 ENCOUNTER — Ambulatory Visit (INDEPENDENT_AMBULATORY_CARE_PROVIDER_SITE_OTHER): Payer: Medicare Other | Admitting: Podiatry

## 2022-12-07 DIAGNOSIS — L600 Ingrowing nail: Secondary | ICD-10-CM

## 2022-12-07 NOTE — Patient Instructions (Signed)

## 2022-12-07 NOTE — Progress Notes (Signed)
  Subjective:  Patient ID: [name], female    DOB: 1956-01-15,   MRN: [id number]  Chief Complaint  Patient presents with   Foot Problem    Patient has possible ingrown on the right great hallux, patient stated that the msite was warm to the touch a couple days ago, no trainage, painful    67 y.o. female presents for concern of possible ingrown on her right great toe. Started a couple days ago. Denies injury but relates toenail has been tender to touch and red and swollen . Denies any other pedal complaints. Denies n/v/f/c.   Past Medical History:  Diagnosis Date   Complication of anesthesia    PO nausea and vomiting   Rheumatoid arthritis (HCC)     Objective:  Physical Exam: Vascular: DP/PT pulses 2/4 bilateral. CFT <3 seconds. Normal hair growth on digits. No edema.  Skin. No lacerations or abrasions bilateral feet. Incurvation of medial border of right great toenail with mild erythema. No edema or purulence noted. Tender.  Musculoskeletal: MMT 5/5 bilateral lower extremities in DF, PF, Inversion and Eversion. Deceased ROM in DF of ankle joint.  Neurological: Sensation intact to light touch.   Assessment:   1. Ingrown right greater toenail      Plan:  Patient was evaluated and treated and all questions answered. Discussed ingrown toenails etiology and treatment options including procedure for removal vs conservative care.  Patient requesting removal of ingrown nail today. Procedure below.  Discussed procedure and post procedure care and patient expressed understanding.  Will follow-up in 2 weeks for nail check or sooner if any problems arise.    Procedure:  Procedure: partial Nail Avulsion of right hallux medial nail border.  Surgeon: Louann Sjogren, DPM  Pre-op Dx: Ingrown toenail with infection Post-op: Same  Place of Surgery: Office exam room.  Indications for surgery: Painful and ingrown toenail.    The patient is requesting removal of nail with chemical  matrixectomy. Risks and complications were discussed with the patient for which they understand and written consent was obtained. Under sterile conditions a total of 3 mL of  1% lidocaine plain was infiltrated in a hallux block fashion. Once anesthetized, the skin was prepped in sterile fashion. A tourniquet was then applied. Next the medial aspect of hallux nail border was then sharply excised making sure to remove the entire offending nail border.  Next phenol was then applied under standard conditions and copiously irrigated. Silvadene was applied. A dry sterile dressing was applied. After application of the dressing the tourniquet was removed and there is found to be an immediate capillary refill time to the digit. The patient tolerated the procedure well without any complications. Post procedure instructions were discussed the patient for which he verbally understood. Follow-up in two weeks for nail check or sooner if any problems are to arise. Discussed signs/symptoms of infection and directed to call the office immediately should any occur or go directly to the emergency room. In the meantime, encouraged to call the office with any questions, concerns, changes symptoms.   Louann Sjogren, DPM

## 2022-12-21 ENCOUNTER — Encounter: Payer: Self-pay | Admitting: Podiatry

## 2022-12-21 ENCOUNTER — Ambulatory Visit (INDEPENDENT_AMBULATORY_CARE_PROVIDER_SITE_OTHER): Payer: Medicare Other | Admitting: Podiatry

## 2022-12-21 DIAGNOSIS — L600 Ingrowing nail: Secondary | ICD-10-CM

## 2022-12-21 DIAGNOSIS — L97512 Non-pressure chronic ulcer of other part of right foot with fat layer exposed: Secondary | ICD-10-CM | POA: Diagnosis not present

## 2022-12-21 DIAGNOSIS — G629 Polyneuropathy, unspecified: Secondary | ICD-10-CM | POA: Diagnosis not present

## 2022-12-21 NOTE — Progress Notes (Signed)
  Subjective:  Patient ID: [name], female    DOB: 1956/05/01,   MRN: [id number]  Chief Complaint  Patient presents with   Ingrown Toenail    Rm 23 Right great nail check. Pt states there is some soreness and brown drainage on her band aid yesterday.     67 y.o. female presents for follow-up of ingrown nail on right. Relates that is doing well but has a callus on the big toe that has been sore . Denies any other pedal complaints. Denies n/v/f/c.   Past Medical History:  Diagnosis Date   Complication of anesthesia    PO nausea and vomiting   Rheumatoid arthritis (HCC)     Objective:  Physical Exam: Vascular: DP/PT pulses 2/4 bilateral. CFT <3 seconds. Normal hair growth on digits. No edema.  Skin. No lacerations or abrasions bilateral feet. Right hallux nail healed well. Hyperkeartotic lesion medial hallux upon debridement 0.4 cm x 0.3 cm wound with granular base. No erythema edema or purulence noted.  Musculoskeletal: MMT 5/5 bilateral lower extremities in DF, PF, Inversion and Eversion. Deceased ROM in DF of ankle joint.  Neurological: Sensation intact to light touch.   Assessment:   1. Neuropathy   2. Skin ulcer of right great toe with fat layer exposed (HCC)      Plan:  Patient was evaluated and treated and all questions answered. Ulcer right hallux with fat layer exposed  -Debridement as below. -Dressed with betadine, DSD. -Off-loading with surgical shoe. -No abx indicated.  -Discussed glucose control and proper protein-rich diet.  -Discussed if any worsening redness, pain, fever or chills to call or may need to report to the emergency room. Patient expressed understanding.   Procedure: Excisional Debridement of Wound Rationale: Removal of non-viable soft tissue from the wound to promote healing.  Anesthesia: none Pre-Debridement Wound Measurements: Overlying callus  Post-Debridement Wound Measurements: 0.4 cm x 0.3 cm x 0.2 cm  Type of Debridement: Sharp  Excisional Tissue Removed: Non-viable soft tissue Depth of Debridement: subcutaneous tissue. Technique: Sharp excisional debridement to bleeding, viable wound base.  Dressing: Dry, sterile, compression dressing. Disposition: Patient tolerated procedure well. Patient to return in 2 week for follow-up.  Return in about 2 weeks (around 01/04/2023) for wound check.   Louann Sjogren, DPM

## 2022-12-26 ENCOUNTER — Encounter: Payer: Medicare Other | Admitting: Neurology

## 2023-01-04 ENCOUNTER — Ambulatory Visit (INDEPENDENT_AMBULATORY_CARE_PROVIDER_SITE_OTHER): Payer: Medicare Other | Admitting: Podiatry

## 2023-01-04 ENCOUNTER — Encounter: Payer: Self-pay | Admitting: Podiatry

## 2023-01-04 DIAGNOSIS — G629 Polyneuropathy, unspecified: Secondary | ICD-10-CM

## 2023-01-04 DIAGNOSIS — L97511 Non-pressure chronic ulcer of other part of right foot limited to breakdown of skin: Secondary | ICD-10-CM

## 2023-01-04 NOTE — Progress Notes (Signed)
  Subjective:  Patient ID: [name], female    DOB: Jun 30, 1956,   MRN: [id number]  Chief Complaint  Patient presents with   Wound Check    Patient states wound is getting better , states nail is too but there is still some soreness     67 y.o. female presents for follow-up right great toe wound.  . Denies any other pedal complaints. Denies n/v/f/c.   Past Medical History:  Diagnosis Date   Complication of anesthesia    PO nausea and vomiting   Rheumatoid arthritis (HCC)     Objective:  Physical Exam: Vascular: DP/PT pulses 2/4 bilateral. CFT <3 seconds. Normal hair growth on digits. No edema.  Skin. No lacerations or abrasions bilateral feet. Right hallux nail healed well. Hyperkeartotic lesion medial hallux upon debridement 0.4 cm x 0.3 cm x 01 cm  wound with granular base. No erythema edema or purulence noted.  Musculoskeletal: MMT 5/5 bilateral lower extremities in DF, PF, Inversion and Eversion. Deceased ROM in DF of ankle joint.  Neurological: Sensation intact to light touch.   Assessment:   1. Skin ulcer of right great toe, limited to breakdown of skin (HCC)   2. Neuropathy       Plan:  Patient was evaluated and treated and all questions answered. Ulcer right hallux limited to breakdown of skin. -Debridement as below. -Dressed with betadine, DSD. -Off-loading with surgical shoe. -No abx indicated.  -Discussed glucose control and proper protein-rich diet.  -Discussed if any worsening redness, pain, fever or chills to call or may need to report to the emergency room. Patient expressed understanding.   Procedure: Excisional Debridement of Wound Rationale: Removal of non-viable soft tissue from the wound to promote healing.  Anesthesia: none Pre-Debridement Wound Measurements: Overlying callus  Post-Debridement Wound Measurements: 0.4 cm x 0.3 cm x 0.1 cm  Type of Debridement: Sharp Excisional Tissue Removed: Non-viable soft tissue Depth of Debridement:  subcutaneous tissue. Technique: Sharp excisional debridement to bleeding, viable wound base.  Dressing: Dry, sterile, compression dressing. Disposition: Patient tolerated procedure well. Patient to return in 2 week for follow-up.  Return in about 2 weeks (around 01/18/2023) for wound check.   Louann Sjogren, DPM

## 2023-01-12 ENCOUNTER — Encounter: Payer: Medicare Other | Admitting: Neurology

## 2023-01-23 ENCOUNTER — Ambulatory Visit (INDEPENDENT_AMBULATORY_CARE_PROVIDER_SITE_OTHER): Payer: Medicare Other | Admitting: Podiatry

## 2023-01-23 DIAGNOSIS — G629 Polyneuropathy, unspecified: Secondary | ICD-10-CM | POA: Diagnosis not present

## 2023-01-23 DIAGNOSIS — L97511 Non-pressure chronic ulcer of other part of right foot limited to breakdown of skin: Secondary | ICD-10-CM

## 2023-01-23 DIAGNOSIS — L600 Ingrowing nail: Secondary | ICD-10-CM

## 2023-01-23 NOTE — Progress Notes (Signed)
  Subjective:  Patient ID: [name], female    DOB: November 09, 1955,   MRN: [id number]  Chief Complaint  Patient presents with   Foot Ulcer    Great toe ulcer  f/u patient states no discharge no pain. States she thinks she may have a ingrown     67 y.o. female presents for follow-up right great toe wound. Relates doing well with minimal pain.   . Denies any other pedal complaints. Denies n/v/f/c.   Past Medical History:  Diagnosis Date   Complication of anesthesia    PO nausea and vomiting   Rheumatoid arthritis (HCC)     Objective:  Physical Exam: Vascular: DP/PT pulses 2/4 bilateral. CFT <3 seconds. Normal hair growth on digits. No edema.  Skin. No lacerations or abrasions bilateral feet. Right hallux nail healed well. Hyperkeartotic lesion medial hallux upon debridement  wound healed.   Incurvation mildly to lateral border of right hallux.  Musculoskeletal: MMT 5/5 bilateral lower extremities in DF, PF, Inversion and Eversion. Deceased ROM in DF of ankle joint.  Neurological: Sensation intact to light touch.   Assessment:   1. Skin ulcer of right great toe, limited to breakdown of skin (HCC)   2. Neuropathy   3. Ingrown right greater toenail        Plan:  Patient was evaluated and treated and all questions answered. Ulcer right hallux -healed -Minimal debridement needed -Dressed with bandaid for cushion. -Return to regular shoes.  -No abx indicated -Right hallux nail debrided without incident as courtesy. .  -Discussed if any worsening redness, pain, fever or chills to call or may need to report to the emergency room. Patient expressed understanding.   Follow-up in 4 weeks for recheck of wound.   Return in about 4 weeks (around 02/20/2023) for wound check.   [name], DPM

## 2023-02-06 DIAGNOSIS — E538 Deficiency of other specified B group vitamins: Secondary | ICD-10-CM | POA: Diagnosis not present

## 2023-02-06 DIAGNOSIS — E559 Vitamin D deficiency, unspecified: Secondary | ICD-10-CM | POA: Diagnosis not present

## 2023-02-06 DIAGNOSIS — Z79899 Other long term (current) drug therapy: Secondary | ICD-10-CM | POA: Diagnosis not present

## 2023-02-06 DIAGNOSIS — G629 Polyneuropathy, unspecified: Secondary | ICD-10-CM | POA: Diagnosis not present

## 2023-02-06 DIAGNOSIS — M0579 Rheumatoid arthritis with rheumatoid factor of multiple sites without organ or systems involvement: Secondary | ICD-10-CM | POA: Diagnosis not present

## 2023-02-06 DIAGNOSIS — E663 Overweight: Secondary | ICD-10-CM | POA: Diagnosis not present

## 2023-02-06 DIAGNOSIS — Z6826 Body mass index (BMI) 26.0-26.9, adult: Secondary | ICD-10-CM | POA: Diagnosis not present

## 2023-02-06 DIAGNOSIS — R5383 Other fatigue: Secondary | ICD-10-CM | POA: Diagnosis not present

## 2023-02-06 DIAGNOSIS — M791 Myalgia, unspecified site: Secondary | ICD-10-CM | POA: Diagnosis not present

## 2023-02-20 ENCOUNTER — Ambulatory Visit: Payer: Medicare Other | Admitting: Podiatry

## 2023-02-22 ENCOUNTER — Ambulatory Visit (INDEPENDENT_AMBULATORY_CARE_PROVIDER_SITE_OTHER): Payer: Medicare Other | Admitting: Podiatry

## 2023-02-22 ENCOUNTER — Encounter: Payer: Self-pay | Admitting: Podiatry

## 2023-02-22 DIAGNOSIS — G629 Polyneuropathy, unspecified: Secondary | ICD-10-CM

## 2023-02-22 DIAGNOSIS — L97511 Non-pressure chronic ulcer of other part of right foot limited to breakdown of skin: Secondary | ICD-10-CM | POA: Diagnosis not present

## 2023-02-22 NOTE — Progress Notes (Signed)
  Subjective:  Patient ID: [name], female    DOB: 1955-10-24,   MRN: [id number]  Chief Complaint  Patient presents with   Wound Check    wound check, 4 weeks    67 y.o. female presents for follow-up right great toe wound. Relates she was on her feet a lot and the toe is looking worse. .   . Denies any other pedal complaints. Denies n/v/f/c.   Past Medical History:  Diagnosis Date   Complication of anesthesia    PO nausea and vomiting   Rheumatoid arthritis (HCC)     Objective:  Physical Exam: Vascular: DP/PT pulses 2/4 bilateral. CFT <3 seconds. Normal hair growth on digits. No edema.  Skin. No lacerations or abrasions bilateral feet. Right hallux nail healed well. Hyperkeartotic lesion medial hallux upon debridement  wound  open with granular base measuring 0.5 cm x 0.3 cm x 0.2 cm. No erythema edema or purulence noted.    Musculoskeletal: MMT 5/5 bilateral lower extremities in DF, PF, Inversion and Eversion. Deceased ROM in DF of ankle joint.  Neurological: Sensation intact to light touch.   Assessment:   1. Skin ulcer of right great toe, limited to breakdown of skin (HCC)   2. Neuropathy         Plan:  Patient was evaluated and treated and all questions answered. Ulcer right hallux with fat layer exposed  -Debridement as below. -Dressed with betadine, DSD.  -Off-loading with surgical shoe. -No abx indicated.  -Discussed glucose control and proper protein-rich diet.  -Discussed if any worsening redness, pain, fever or chills to call or may need to report to the emergency room. Patient expressed understanding.   Procedure: Excisional Debridement of Wound Rationale: Removal of non-viable soft tissue from the wound to promote healing.  Anesthesia: none Pre-Debridement Wound Measurements: Overlying callus  Post-Debridement Wound Measurements: 0.5 cm x 0.3 cm x 0.2 cm  Type of Debridement: Sharp Excisional Tissue Removed: Non-viable soft tissue Depth of  Debridement: subcutaneous tissue. Technique: Sharp excisional debridement to bleeding, viable wound base.  Dressing: Dry, sterile, compression dressing. Disposition: Patient tolerated procedure well. Patient to return in 2 week for follow-up.  Return in about 2 weeks (around 03/08/2023) for wound check.    Return in about 2 weeks (around 03/08/2023) for wound check.   Louann Sjogren, DPM

## 2023-02-24 ENCOUNTER — Telehealth: Payer: Self-pay | Admitting: Podiatry

## 2023-02-24 ENCOUNTER — Other Ambulatory Visit: Payer: Self-pay | Admitting: Podiatry

## 2023-02-24 MED ORDER — MUPIROCIN 2 % EX OINT: 1.0000 | TOPICAL_OINTMENT | Freq: Two times a day (BID) | CUTANEOUS | 2 refills | Status: AC

## 2023-02-24 NOTE — Progress Notes (Signed)
mup 

## 2023-02-24 NOTE — Telephone Encounter (Signed)
I had advised betadine which is an OTC medication. I will send in mupirocin as well which is prescription and she can alternate this and betadine

## 2023-02-24 NOTE — Telephone Encounter (Signed)
Pt states that pharmacy has not received the Rx for her wound. She stated that it is topical. She uses the [facility] on [address] in [place]. Please advise

## 2023-03-13 ENCOUNTER — Ambulatory Visit (INDEPENDENT_AMBULATORY_CARE_PROVIDER_SITE_OTHER): Payer: Self-pay | Admitting: Neurology

## 2023-03-13 ENCOUNTER — Ambulatory Visit (INDEPENDENT_AMBULATORY_CARE_PROVIDER_SITE_OTHER): Payer: Medicare Other | Admitting: Neurology

## 2023-03-13 DIAGNOSIS — R200 Anesthesia of skin: Secondary | ICD-10-CM | POA: Diagnosis not present

## 2023-03-13 DIAGNOSIS — G609 Hereditary and idiopathic neuropathy, unspecified: Secondary | ICD-10-CM

## 2023-03-13 DIAGNOSIS — Z0289 Encounter for other administrative examinations: Secondary | ICD-10-CM

## 2023-03-13 NOTE — Patient Instructions (Addendum)
Peripheral Neuropathy Peripheral neuropathy is a type of nerve damage. It affects nerves that carry signals between the spinal cord and the arms, legs, and the rest of the body (peripheral nerves). It does not affect nerves in the spinal cord or brain. In peripheral neuropathy, one nerve or a group of nerves may be damaged. Peripheral neuropathy is a broad category that includes many specific nerve disorders, like diabetic neuropathy, hereditary neuropathy, and carpal tunnel syndrome. What are the causes? This condition may be caused by: Certain diseases, such as: Diabetes. This is the most common cause of peripheral neuropathy. Autoimmune diseases, such as rheumatoid arthritis and systemic lupus erythematosus. Nerve diseases that are passed from parent to child (inherited). Kidney disease. Thyroid disease. Other causes may include: Nerve injury. Pressure or stress on a nerve that lasts a long time. Lack (deficiency) of B vitamins. This can result from alcoholism, poor diet, or a restricted diet. Infections. Some medicines, such as cancer medicines (chemotherapy). Poisonous (toxic) substances, such as lead and mercury. Too little blood flowing to the legs due to smoking or other causes Smoking can directly damage nerves and blood vessels In some cases, the cause of this condition is not known. What are the signs or symptoms? Symptoms of this condition depend on which of your nerves is damaged. Symptoms in the legs, hands, and arms can include: Loss of feeling (numbness) in the feet, hands, or both. Tingling in the feet, hands, or both. Burning pain. Very sensitive skin. Weakness. Not being able to move a part of the body (paralysis). Clumsiness or poor coordination. Muscle twitching. Loss of balance. Symptoms in other parts of the body can include: Not being able to control your bladder. Feeling dizzy. Sexual problems. How is this diagnosed? Diagnosing and finding the cause of  peripheral neuropathy can be difficult. Your health care provider will take your medical history and do a physical exam. A neurological exam will also be done. This involves checking things that are affected by your brain, spinal cord, and nerves (nervous system). For example, your health care provider will check your reflexes, how you move, and what you can feel. You may have other tests, such as: Blood tests. Electromyogram (EMG) and nerve conduction tests. These tests check nerve function and how well the nerves are controlling the muscles. Imaging tests, such as a CT scan or MRI, to rule out other causes of your symptoms. Removing a small piece of nerve to be examined in a lab (nerve biopsy). Removing and examining a small amount of the fluid that surrounds the brain and spinal cord (lumbar puncture). How is this treated? Treatment for this condition may involve: Treating the underlying cause of the neuropathy, such as diabetes, kidney disease, or vitamin deficiencies. Stopping medicines that can cause neuropathy, such as chemotherapy. Medicine to help relieve pain. Medicines may include: Prescription or over-the-counter pain medicine. Anti-seizure medicine. Antidepressants. Pain-relieving patches that are applied to painful areas of skin. Surgery to relieve pressure on a nerve or to destroy a nerve that is causing pain. Physical therapy to help improve movement and balance. Devices to help you move around (assistive devices). Follow these instructions at home: Medicines Take over-the-counter and prescription medicines only as told by your health care provider. Do not take any other medicines without first asking your health care provider. Ask your health care provider if the medicine prescribed to you requires you to avoid driving or using machinery. Lifestyle  Do not use any products that contain nicotine or tobacco.  These products include cigarettes, chewing tobacco, and vaping  devices, such as e-cigarettes. Smoking keeps blood from reaching damaged nerves. If you need help quitting, ask your health care provider. Avoid or limit alcohol. Too much alcohol can cause a vitamin B deficiency, and vitamin B is needed for healthy nerves. Eat a healthy diet. This includes: Eating foods that are high in fiber, such as beans, whole grains, and fresh fruits and vegetables. Limiting foods that are high in fat and processed sugars, such as fried or sweet foods. General instructions  If you have diabetes, work closely with your health care provider to keep your blood sugar under control. If you have numbness in your feet: Check every day for signs of injury or infection. Watch for redness, warmth, and swelling. Wear padded socks and comfortable shoes. These help protect your feet. Develop a good support system. Living with peripheral neuropathy can be stressful. Consider talking with a mental health specialist or joining a support group. Use assistive devices and attend physical therapy as told by your health care provider. This may include using a walker or a cane. Keep all follow-up visits. This is important. Where to find more information [employer] of Neurological Disorders: [employer] Contact a health care provider if: You have new signs or symptoms of peripheral neuropathy. You are struggling emotionally from dealing with peripheral neuropathy. Your pain is not well controlled. Get help right away if: You have an injury or infection that is not healing normally. You develop new weakness in an arm or leg. You have fallen or do so frequently. Summary Peripheral neuropathy is when the nerves in the arms or legs are damaged, resulting in numbness, weakness, or pain. There are many causes of peripheral neuropathy, including diabetes, pinched nerves, vitamin deficiencies, autoimmune disease, and hereditary conditions. Diagnosing and finding the cause of  peripheral neuropathy can be difficult. Your health care provider will take your medical history, do a physical exam, and do tests, including blood tests and nerve function tests. Treatment involves treating the underlying cause of the neuropathy and taking medicines to help control pain. Physical therapy and assistive devices may also help. This information is not intended to replace advice given to you by your health care provider. Make sure you discuss any questions you have with your health care provider. Document Revised: 03/16/2021 Document Reviewed: 03/16/2021 Elsevier Patient Education  2024 [employer].

## 2023-03-14 DIAGNOSIS — E663 Overweight: Secondary | ICD-10-CM | POA: Diagnosis not present

## 2023-03-14 NOTE — Progress Notes (Unsigned)
Full Name: [name] Gender: Female MRN #: [id number] Date of Birth: 27-Mar-1956    Visit Date: 03/13/2023 12:26 Age: 67 Years Examining Physician: Azucena Fallen, PA-C Referring Physician: Dr. Naomie Dean Height: 5 feet 6 inch  History: [name] is a 67 y.o. female here as requested by Azucena Fallen, PA-C for cold toes.   Summary: EMG/NCS was performed on the bilateral lower extremities. All nerves and muscles (as indicated in the following tables) were within normal limits.       Conclusion: This is a normal study. No evidence for neuropathy or radiculopathy.  ------------------------------- Naomie Dean, M.D.  [facility] [address] [place], [place] [address] Tel: [phone] Fax: [phone]  Verbal informed consent was obtained from the patient, patient was informed of potential risk of procedure, including bruising, bleeding, hematoma formation, infection, muscle weakness, muscle pain, numbness, among others.        MNC    Nerve / Sites Muscle Latency Ref. Amplitude Ref. Rel Amp Segments Distance Velocity Ref. Area    ms ms mV mV %  cm m/s m/s mVms  R Peroneal - EDB     Ankle EDB 5.3 ?6.5 3.2 ?2.0 100 Ankle - EDB 9   10.6     Fib head EDB 11.1  3.4  107 Fib head - Ankle 26 45 ?44 13.1     Pop fossa EDB 13.2  3.4  98.7 Pop fossa - Fib head 10 48 ?44 13.1         Pop fossa - Ankle      L Peroneal - EDB     Ankle EDB 5.0 ?6.5 3.8 ?2.0 100 Ankle - EDB 9   15.3     Fib head EDB 11.1  4.3  113 Fib head - Ankle 27 45 ?44 18.2     Pop fossa EDB 13.2  4.5  106 Pop fossa - Fib head 10 46 ?44 19.1         Pop fossa - Ankle      R Tibial - AH     Ankle AH 5.8 ?5.8 5.8 ?4.0 100 Ankle - AH 9   14.3     Pop fossa AH 13.3  3.7  64.8 Pop fossa - Ankle 37 49 ?41 12.3  L Tibial - AH     Ankle AH 5.5 ?5.8 6.8 ?4.0 100 Ankle - AH 9   17.8     Pop fossa AH 12.9  5.7  84.7 Pop fossa - Ankle 35 47 ?41 17.9             SNC     Nerve / Sites Rec. Site Peak Lat Ref.  Amp Ref. Segments Distance    ms ms V V  cm  R Sural - Ankle (Calf)     Calf Ankle 2.9 ?4.4 9 ?6 Calf - Ankle 14  L Sural - Ankle (Calf)     Calf Ankle 3.2 ?4.4 8 ?6 Calf - Ankle 14  R Superficial peroneal - Ankle     Lat leg Ankle 3.6 ?4.4 6 ?6 Lat leg - Ankle 14  L Superficial peroneal - Ankle     Lat leg Ankle 3.6 ?4.4 6 ?6 Lat leg - Ankle 14             F  Wave    Nerve F Lat Ref.   ms ms  R Tibial - AH 52.9 ?56.0  L  Tibial - AH 54.3 ?56.0         H Reflex    Nerve H Lat Lat Hmax   ms ms   Left Right Ref. Left Right Ref.  Tibial - [place]oleus 32.9 34.8 ?35.0 26.1 26.1 ?35.0         EMG Summary Table    Spontaneous MUAP Recruitment  Muscle IA Fib PSW Fasc Other Amp Dur. Poly Pattern  R. Vastus medialis Normal None None None _______ Normal Normal Normal Normal  R. Tibialis anterior Normal None None None _______ Normal Normal Normal Normal  R. Gastrocnemius (Medial head) Normal None None None _______ Normal Normal Normal Normal  R. Extensor hallucis longus Normal None None None _______ Normal Normal Normal Normal  R. Abductor hallucis Normal None None None _______ Normal Normal Normal Normal  R. Gluteus maximus Normal None None None _______ Normal Normal Normal Normal  R. Gluteus medius Normal None None None _______ Normal Normal Normal Normal  R. Biceps femoris (long head) Normal None None None _______ Normal Normal Normal Normal  R. Lumbar paraspinals (low) Normal None None None _______ Normal Normal Normal Normal

## 2023-03-15 ENCOUNTER — Ambulatory Visit (INDEPENDENT_AMBULATORY_CARE_PROVIDER_SITE_OTHER): Payer: Medicare Other | Admitting: Podiatry

## 2023-03-15 DIAGNOSIS — L97512 Non-pressure chronic ulcer of other part of right foot with fat layer exposed: Secondary | ICD-10-CM | POA: Diagnosis not present

## 2023-03-15 DIAGNOSIS — G629 Polyneuropathy, unspecified: Secondary | ICD-10-CM | POA: Diagnosis not present

## 2023-03-15 NOTE — Progress Notes (Signed)
  Subjective:  Patient ID: [name], female    DOB: Mar 30, 1956,   MRN: [id number]  No chief complaint on file.   67 y.o. female presents for follow-up right great toe wound. Relates she is doing better and has been alternating mupirocin and betadine to the wound.   . Denies any other pedal complaints. Denies n/v/f/c.   Past Medical History:  Diagnosis Date   Complication of anesthesia    PO nausea and vomiting   Rheumatoid arthritis (HCC)     Objective:  Physical Exam: Vascular: DP/PT pulses 2/4 bilateral. CFT <3 seconds. Normal hair growth on digits. No edema.  Skin. No lacerations or abrasions bilateral feet. Right hallux nail healed well. Hyperkeartotic lesion medial hallux upon debridement  wound  open with granular base measuring 0.2 cm x 0.1 cm x 0.2 cm. No erythema edema or purulence noted.    Musculoskeletal: MMT 5/5 bilateral lower extremities in DF, PF, Inversion and Eversion. Deceased ROM in DF of ankle joint.  Neurological: Sensation intact to light touch.   Assessment:   1. Skin ulcer of right great toe with fat layer exposed (HCC)   2. Neuropathy          Plan:  Patient was evaluated and treated and all questions answered. Ulcer right hallux with fat layer exposed  -Debridement as below. -Dressed with betadine, DSD.  -Off-loading with surgical shoe. -No abx indicated.  -Discussed glucose control and proper protein-rich diet.  -Discussed if any worsening redness, pain, fever or chills to call or may need to report to the emergency room. Patient expressed understanding.   Procedure: Excisional Debridement of Wound Rationale: Removal of non-viable soft tissue from the wound to promote healing.  Anesthesia: none Pre-Debridement Wound Measurements: Overlying callus  Post-Debridement Wound Measurements: 0.2 cm x 0.1 cm x 0.2 cm  Type of Debridement: Sharp Excisional Tissue Removed: Non-viable soft tissue Depth of Debridement: subcutaneous  tissue. Technique: Sharp excisional debridement to bleeding, viable wound base.  Dressing: Dry, sterile, compression dressing. Disposition: Patient tolerated procedure well. Patient to return in 2 week for follow-up.  Return in about 2 weeks (around 03/29/2023) for wound check.    Return in about 2 weeks (around 03/29/2023) for wound check.   Louann Sjogren, DPM

## 2023-03-15 NOTE — Procedures (Signed)
Full Name: [name] Gender: Female MRN #: [id number] Date of Birth: 27-Mar-1956    Visit Date: 03/13/2023 12:26 Age: 67 Years Examining Physician: Azucena Fallen, PA-C Referring Physician: Dr. Naomie Dean Height: 5 feet 6 inch  History: [name] is a 67 y.o. female here as requested by Azucena Fallen, PA-C for cold toes.   Summary: EMG/NCS was performed on the bilateral lower extremities. All nerves and muscles (as indicated in the following tables) were within normal limits.       Conclusion: This is a normal study. No evidence for neuropathy or radiculopathy.  ------------------------------- Naomie Dean, M.D.  [facility] [address] [place], [place] [address] Tel: [phone] Fax: [phone]  Verbal informed consent was obtained from the patient, patient was informed of potential risk of procedure, including bruising, bleeding, hematoma formation, infection, muscle weakness, muscle pain, numbness, among others.        MNC    Nerve / Sites Muscle Latency Ref. Amplitude Ref. Rel Amp Segments Distance Velocity Ref. Area    ms ms mV mV %  cm m/s m/s mVms  R Peroneal - EDB     Ankle EDB 5.3 ?6.5 3.2 ?2.0 100 Ankle - EDB 9   10.6     Fib head EDB 11.1  3.4  107 Fib head - Ankle 26 45 ?44 13.1     Pop fossa EDB 13.2  3.4  98.7 Pop fossa - Fib head 10 48 ?44 13.1         Pop fossa - Ankle      L Peroneal - EDB     Ankle EDB 5.0 ?6.5 3.8 ?2.0 100 Ankle - EDB 9   15.3     Fib head EDB 11.1  4.3  113 Fib head - Ankle 27 45 ?44 18.2     Pop fossa EDB 13.2  4.5  106 Pop fossa - Fib head 10 46 ?44 19.1         Pop fossa - Ankle      R Tibial - AH     Ankle AH 5.8 ?5.8 5.8 ?4.0 100 Ankle - AH 9   14.3     Pop fossa AH 13.3  3.7  64.8 Pop fossa - Ankle 37 49 ?41 12.3  L Tibial - AH     Ankle AH 5.5 ?5.8 6.8 ?4.0 100 Ankle - AH 9   17.8     Pop fossa AH 12.9  5.7  84.7 Pop fossa - Ankle 35 47 ?41 17.9             SNC     Nerve / Sites Rec. Site Peak Lat Ref.  Amp Ref. Segments Distance    ms ms V V  cm  R Sural - Ankle (Calf)     Calf Ankle 2.9 ?4.4 9 ?6 Calf - Ankle 14  L Sural - Ankle (Calf)     Calf Ankle 3.2 ?4.4 8 ?6 Calf - Ankle 14  R Superficial peroneal - Ankle     Lat leg Ankle 3.6 ?4.4 6 ?6 Lat leg - Ankle 14  L Superficial peroneal - Ankle     Lat leg Ankle 3.6 ?4.4 6 ?6 Lat leg - Ankle 14             F  Wave    Nerve F Lat Ref.   ms ms  R Tibial - AH 52.9 ?56.0  L  Tibial - AH 54.3 ?56.0         H Reflex    Nerve H Lat Lat Hmax   ms ms   Left Right Ref. Left Right Ref.  Tibial - Soleus 32.9 34.8 ?35.0 26.1 26.1 ?35.0         EMG Summary Table    Spontaneous MUAP Recruitment  Muscle IA Fib PSW Fasc Other Amp Dur. Poly Pattern  R. Vastus medialis Normal None None None _______ Normal Normal Normal Normal  R. Tibialis anterior Normal None None None _______ Normal Normal Normal Normal  R. Gastrocnemius (Medial head) Normal None None None _______ Normal Normal Normal Normal  R. Extensor hallucis longus Normal None None None _______ Normal Normal Normal Normal  R. Abductor hallucis Normal None None None _______ Normal Normal Normal Normal  R. Gluteus maximus Normal None None None _______ Normal Normal Normal Normal  R. Gluteus medius Normal None None None _______ Normal Normal Normal Normal  R. Biceps femoris (long head) Normal None None None _______ Normal Normal Normal Normal  R. Lumbar paraspinals (low) Normal None None None _______ Normal Normal Normal Normal

## 2023-04-03 ENCOUNTER — Ambulatory Visit (INDEPENDENT_AMBULATORY_CARE_PROVIDER_SITE_OTHER): Payer: Medicare Other | Admitting: Podiatry

## 2023-04-03 DIAGNOSIS — L97512 Non-pressure chronic ulcer of other part of right foot with fat layer exposed: Secondary | ICD-10-CM | POA: Diagnosis not present

## 2023-04-03 DIAGNOSIS — G629 Polyneuropathy, unspecified: Secondary | ICD-10-CM | POA: Diagnosis not present

## 2023-04-03 NOTE — Progress Notes (Signed)
  Subjective:  Patient ID: [name], female    DOB: [birth year],   MRN: [id number]  No chief complaint on file.   67 y.o. female presents for follow-up right great toe wound. Relates she is doing better and has been alternating mupirocin and betadine to the wound.   . Denies any other pedal complaints. Denies n/v/f/c.   Past Medical History:  Diagnosis Date   Complication of anesthesia    PO nausea and vomiting   Rheumatoid arthritis (HCC)     Objective:  Physical Exam: Vascular: DP/PT pulses 2/4 bilateral. CFT <3 seconds. Normal hair growth on digits. No edema.  Skin. No lacerations or abrasions bilateral feet. Right hallux nail healed well. Hyperkeartotic lesion medial hallux upon debridement  wound  open with granular base measuring 0.1 cm x 0.1 cm x 0.2 cm. No erythema edema or purulence noted.    Musculoskeletal: MMT 5/5 bilateral lower extremities in DF, PF, Inversion and Eversion. Deceased ROM in DF of ankle joint.  Neurological: Sensation intact to light touch.   Assessment:   1. Skin ulcer of right great toe with fat layer exposed (HCC)   2. Neuropathy           Plan:  Patient was evaluated and treated and all questions answered. Ulcer right hallux with fat layer exposed  -Debridement as below. -Dressed with betadine, DSD.  -Off-loading with surgical shoe. -No abx indicated.  -Discussed glucose control and proper protein-rich diet.  -Discussed if any worsening redness, pain, fever or chills to call or may need to report to the emergency room. Patient expressed understanding.   Procedure: Excisional Debridement of Wound Rationale: Removal of non-viable soft tissue from the wound to promote healing.  Anesthesia: none Pre-Debridement Wound Measurements: Overlying callus  Post-Debridement Wound Measurements: 0.1 cm x 0.1 cm x 0.2 cm  Type of Debridement: Sharp Excisional Tissue Removed: Non-viable soft tissue Depth of Debridement: subcutaneous  tissue. Technique: Sharp excisional debridement to bleeding, viable wound base.  Dressing: Dry, sterile, compression dressing. Disposition: Patient tolerated procedure well. Patient to return in 3 week for follow-up.  Return in about 3 weeks (around 04/24/2023) for wound check.    Return in about 3 weeks (around 04/24/2023) for wound check.   Louann Sjogren, DPM

## 2023-04-24 ENCOUNTER — Ambulatory Visit (INDEPENDENT_AMBULATORY_CARE_PROVIDER_SITE_OTHER): Payer: Medicare Other | Admitting: Podiatry

## 2023-04-24 DIAGNOSIS — G629 Polyneuropathy, unspecified: Secondary | ICD-10-CM | POA: Diagnosis not present

## 2023-04-24 DIAGNOSIS — L97512 Non-pressure chronic ulcer of other part of right foot with fat layer exposed: Secondary | ICD-10-CM | POA: Diagnosis not present

## 2023-04-24 NOTE — Progress Notes (Signed)
Subjective:  Patient ID: [name], female    DOB: [birth year],   MRN: [id number]  Chief Complaint  Patient presents with   Nail Problem    Rfc.    67 y.o. female presents for follow-up right great toe wound. Relates she is doing well and has been alternating mupirocin and betadine to the wound. Currently smoking 1 ppd   . Denies any other pedal complaints. Denies n/v/f/c.   Past Medical History:  Diagnosis Date   Complication of anesthesia    PO nausea and vomiting   Rheumatoid arthritis (HCC)     Objective:  Physical Exam: Vascular: DP/PT pulses 2/4 bilateral. CFT <3 seconds. Normal hair growth on digits. No edema.  Skin. No lacerations or abrasions bilateral feet. Right hallux nail healed well. Hyperkeartotic lesion medial hallux upon debridement  wound  open with granular base measuring 0.2 cm x 0.3 cm x 0.2 cm. No erythema edema or purulence noted.    Musculoskeletal: MMT 5/5 bilateral lower extremities in DF, PF, Inversion and Eversion. Deceased ROM in DF of ankle joint.  Neurological: Sensation intact to light touch.   Assessment:   1. Skin ulcer of right great toe with fat layer exposed (HCC)   2. Neuropathy            Plan:  Patient was evaluated and treated and all questions answered. Ulcer right hallux with fat layer exposed  -Debridement as below. -Discussed wound worsening and trying a referral to wound care for assistance in getting this wound healed.  -Dressed with betadine, DSD.  -Off-loading with surgical shoe. -No abx indicated.  -Discussed smoking cessation -Discussed glucose control and proper protein-rich diet.  -Discussed if any worsening redness, pain, fever or chills to call or may need to report to the emergency room. Patient expressed understanding.   Procedure: Excisional Debridement of Wound Rationale: Removal of non-viable soft tissue from the wound to promote healing.  Anesthesia: none Pre-Debridement Wound Measurements:  Overlying callus  Post-Debridement Wound Measurements: 0.2 cm x 0.3 cm x 0.2 cm  Type of Debridement: Sharp Excisional Tissue Removed: Non-viable soft tissue Depth of Debridement: subcutaneous tissue. Technique: Sharp excisional debridement to bleeding, viable wound base.  Dressing: Dry, sterile, compression dressing. Disposition: Patient tolerated procedure well. Patient to return in 3 week for follow-up.  No follow-ups on file.    No follow-ups on file.   Louann Sjogren, DPM

## 2023-04-27 DIAGNOSIS — H04123 Dry eye syndrome of bilateral lacrimal glands: Secondary | ICD-10-CM | POA: Diagnosis not present

## 2023-05-02 ENCOUNTER — Encounter (HOSPITAL_BASED_OUTPATIENT_CLINIC_OR_DEPARTMENT_OTHER): Payer: Medicare Other | Attending: General Surgery | Admitting: General Surgery

## 2023-05-02 DIAGNOSIS — F17210 Nicotine dependence, cigarettes, uncomplicated: Secondary | ICD-10-CM | POA: Insufficient documentation

## 2023-05-02 DIAGNOSIS — M069 Rheumatoid arthritis, unspecified: Secondary | ICD-10-CM | POA: Insufficient documentation

## 2023-05-02 DIAGNOSIS — Z7969 Long term (current) use of other immunomodulators and immunosuppressants: Secondary | ICD-10-CM | POA: Diagnosis not present

## 2023-05-02 DIAGNOSIS — Z860100 Personal history of colon polyps, unspecified: Secondary | ICD-10-CM | POA: Diagnosis not present

## 2023-05-02 DIAGNOSIS — L97512 Non-pressure chronic ulcer of other part of right foot with fat layer exposed: Secondary | ICD-10-CM | POA: Diagnosis not present

## 2023-05-02 DIAGNOSIS — I10 Essential (primary) hypertension: Secondary | ICD-10-CM | POA: Insufficient documentation

## 2023-05-02 DIAGNOSIS — G6289 Other specified polyneuropathies: Secondary | ICD-10-CM | POA: Diagnosis not present

## 2023-05-02 NOTE — Progress Notes (Signed)
[place], [name] ([id number]) [phone].pdf Page 1 of 4 Visit Report for 05/02/2023 Abuse Risk Screen Details Patient Name: Date of Service: [name], [name] 05/02/2023 12:45 PM Medical Record Number: [id number] Patient Account Number: [id number] Date of Birth/Sex: Treating RN: [birth year] (67 y.o. [name] Primary Care Cadel Stairs: [place] Other Clinician: Referring Shamara Soza: Treating Demiya Magno/Extender: Cassie Freer in Treatment: 0 Abuse Risk Screen Items Answer ABUSE RISK SCREEN: Has anyone close to you tried to hurt or harm you recentlyo No Do you feel uncomfortable with anyone in your familyo No Has anyone forced you do things that you didnt want to doo No Electronic Signature(s) Signed: 05/02/2023 3:15:01 PM By: Samuella Bruin Entered By: Samuella Bruin on 05/02/2023 12:39:10 -------------------------------------------------------------------------------- Activities of Daily Living Details Patient Name: Date of Service: [name], [name] 05/02/2023 12:45 PM Medical Record Number: [id number] Patient Account Number: [id number] Date of Birth/Sex: Treating RN: 11-30-1955 (67 y.o. [name] Primary Care Danielle Lento: [place] Other Clinician: Referring Quashaun Lazalde: Treating Sovereign Ramiro/Extender: Cassie Freer in Treatment: 0 Activities of Daily Living Items Answer Activities of Daily Living (Please select one for each item) Drive Automobile Completely Able T Medications ake Completely Able Use T elephone Completely Able Care for Appearance Completely Able Use T oilet Completely Able Bath / Shower Completely Able Dress Self Completely Able Feed Self Completely Able Walk Completely Able Get In / Out Bed Completely Able Housework Completely Able Prepare Meals Completely Able Handle Money Completely Able Shop for Self Completely Able Electronic Signature(s) Signed:  05/02/2023 3:15:01 PM By: Samuella Bruin Entered By: Samuella Bruin on 05/02/2023 12:39:36 [name] ([id number]) [id number] Page 2 of 4 -------------------------------------------------------------------------------- Education Screening Details Patient Name: Date of Service: [name], [name] 05/02/2023 12:45 PM Medical Record Number: [id number] Patient Account Number: [id number] Date of Birth/Sex: Treating RN: May 11, 1956 (67 y.o. [name] Primary Care Destany Severns: [place] Other Clinician: Referring Nashika Coker: Treating Marilouise Densmore/Extender: Cassie Freer in Treatment: 0 Primary Learner Assessed: Patient Learning Preferences/Education Level/Primary Language Learning Preference: Explanation, Demonstration, Video, Printed Material Highest Education Level: High School Preferred Language: English Cognitive Barrier Language Barrier: No Translator Needed: No Memory Deficit: No Emotional Barrier: No Cultural/Religious Beliefs Affecting Medical Care: No Physical Barrier Impaired Vision: No Impaired Hearing: No Decreased Hand dexterity: No Knowledge/Comprehension Knowledge Level: Medium Comprehension Level: Medium Ability to understand written instructions: Medium Ability to understand verbal instructions: Medium Motivation Anxiety Level: Calm Cooperation: Cooperative Education Importance: Acknowledges Need Interest in Health Problems: Asks Questions Perception: Coherent Willingness to Engage in Self-Management Medium Activities: Readiness to Engage in Self-Management Medium Activities: Electronic Signature(s) Signed: 05/02/2023 3:15:01 PM By: Samuella Bruin Entered By: Samuella Bruin on 05/02/2023 12:39:57 -------------------------------------------------------------------------------- Fall Risk Assessment Details Patient Name: Date of Service: [name], Key 05/02/2023 12:45  PM Medical Record Number: [id number] Patient Account Number: [id number] Date of Birth/Sex: Treating RN: August 17, 1955 (67 y.o. [name] Primary Care Jes Costales: [place] Mon Other Clinician: Referring Kennede Lusk: Treating Emanie Behan/Extender: Cassie Freer in Treatment: 0 Fall Risk Assessment Items Have you had 2 or more falls in the last 12 monthso 0 No McCleary, Isaly ([id number]) [id number] Page 3 of 4 Have you had any fall that resulted in injury in the last 12 monthso 0 No FALLS RISK SCREEN History of falling - immediate or within 3 months 0 No Secondary diagnosis (Do you have 2 or more medical diagnoseso) 15 Yes Ambulatory aid None/bed rest/wheelchair/nurse 0 Yes Crutches/cane/walker 0 No Furniture 0  No Intravenous therapy Access/Saline/Heparin Lock 0 No Gait/Transferring Normal/ bed rest/ wheelchair 0 Yes Weak (short steps with or without shuffle, stooped but able to lift head while walking, may seek 0 No support from furniture) Impaired (short steps with shuffle, may have difficulty arising from chair, head down, impaired 0 No balance) Mental Status Oriented to own ability 0 Yes Electronic Signature(s) Signed: 05/02/2023 3:15:01 PM By: Samuella Bruin Entered By: Samuella Bruin on 05/02/2023 12:40:07 -------------------------------------------------------------------------------- Foot Assessment Details Patient Name: Date of Service: [name], [name] 05/02/2023 12:45 PM Medical Record Number: [id number] Patient Account Number: [id number] Date of Birth/Sex: Treating RN: [birth year] (67 y.o. [name] Primary Care Langley Flatley: [place] Other Clinician: Referring Bassel Gaskill: Treating Jeri Jeanbaptiste/Extender: Cassie Freer in Treatment: 0 Foot Assessment Items Site Locations + = Sensation present, - = Sensation absent, C = Callus, U = Ulcer R = Redness, W = Warmth, M =  Maceration, PU = Pre-ulcerative lesion F = Fissure, S = Swelling, D = Dryness Assessment Right: Left: Other Deformity: No No Prior Foot Ulcer: No No Prior Amputation: No No Charcot Joint: No No Ambulatory Status: Ambulatory Without Help GaitDeani [place], [name] ([id number]) [id number] Page 4 of 4 Electronic Signature(s) Signed: 05/02/2023 3:15:01 PM By: Gelene Mink By: Samuella Bruin on 05/02/2023 12:43:01 -------------------------------------------------------------------------------- Nutrition Risk Screening Details Patient Name: Date of Service: [name], [name] 05/02/2023 12:45 PM Medical Record Number: [id number] Patient Account Number: [id number] Date of Birth/Sex: Treating RN: December 31, 1955 (67 y.o. [name] Primary Care Usiel Astarita: [place] Other Clinician: Referring Robertlee Rogacki: Treating Carols Clemence/Extender: Eloy End Weeks in Treatment: 0 Height (in): 66 Weight (lbs): 158 Body Mass Index (BMI): 25.5 Nutrition Risk Screening Items Score Screening NUTRITION RISK SCREEN: I have an illness or condition that made me change the kind and/or amount of food I eat 0 No I eat fewer than two meals per day 0 No I eat few fruits and vegetables, or milk products 0 No I have three or more drinks of beer, liquor or wine almost every day 0 No I have tooth or mouth problems that make it hard for me to eat 0 No I don't always have enough money to buy the food I need 0 No I eat alone most of the time 0 No I take three or more different prescribed or over-the-counter drugs a day 1 Yes Without wanting to, I have lost or gained 10 pounds in the last six months 0 No I am not always physically able to shop, cook and/or feed myself 0 No Nutrition Protocols Good Risk Protocol 0 No interventions needed Moderate Risk Protocol High Risk Proctocol Risk Level: Good Risk Score: 1 Electronic  Signature(s) Signed: 05/02/2023 3:15:01 PM By: Samuella Bruin Entered By: Samuella Bruin on 05/02/2023 12:40:32

## 2023-05-02 NOTE — Progress Notes (Signed)
[name], [name] 10/8/2024andnbsp12:45 PM Medical Record Number: [id number] Patient Account Number: [id number] Date of Birth/Gender: Treating RN: 1955-12-23 (67 y.o. [name] Primary Care Physician: Cranford Mon Other Clinician: Referring Physician: Treating Physician/Extender: Cassie Freer in Treatment: 0 Education Assessment Education Provided To: Patient Education Topics Provided Smoking and Wound Healing: Methods: Explain/Verbal Responses: Reinforcements needed, State content correctly Wound Debridement: Methods: Explain/Verbal Responses: Reinforcements needed, State content correctly Wound/Skin Impairment: Methods: Explain/Verbal Responses: Reinforcements needed, State content correctly Electronic Signature(s) Signed: 05/02/2023 3:15:01 PM By: Samuella Bruin Entered By: Samuella Bruin on 05/02/2023 10:01:51 [name] ([id number]) [id number] Page 8 of 9 -------------------------------------------------------------------------------- Wound Assessment Details Patient Name: Date of Service: [name], [name] 05/02/2023 12:45 PM Medical Record Number: [id number] Patient Account Number: [id number] Date of Birth/Sex: Treating RN: 1955-11-12 (67 y.o. 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 Great oe Wound Status: Open Wounding Event: Pressure Injury Comorbid History:  Hypertension, Rheumatoid Arthritis, Neuropathy Date Acquired: 12/27/2022 Weeks Of Treatment: 0 Clustered Wound: No Photos Wound Measurements Length: (cm) 0.3 Width: (cm) 0.3 Depth: (cm) 0.2 Area: (cm) 0.071 Volume: (cm) 0.014 % Reduction in Area: % Reduction in Volume: Epithelialization: Small (1-33%) Tunneling: No Undermining: Yes Starting Position (o'clock): 1 Ending Position (o'clock): 6 Maximum Distance: (cm) 0.3 Wound Description Classification: Full Thickness Without Exposed Suppor Wound Margin: Distinct, outline attached Exudate Amount: Medium Exudate Type: Serosanguineous Exudate Color: red, brown t Structures Foul Odor After Cleansing: No Slough/Fibrino Yes Wound Bed Granulation Amount: Large (67-100%) Exposed Structure Granulation Quality: Red, Pink Fascia Exposed: No Necrotic Amount: Small (1-33%) Fat Layer (Subcutaneous Tissue) Exposed: Yes Necrotic Quality: Adherent Slough Tendon Exposed: No Muscle Exposed: No Joint Exposed: No Bone Exposed: No Periwound Skin Texture Texture Color No Abnormalities Noted: No No Abnormalities Noted: Yes Callus: Yes Temperature / Pain Temperature: No Abnormality Moisture No Abnormalities Noted: Yes Electronic Signature(s) Signed: 05/02/2023 3:15:01 PM By: Renaee Munda, [name] ([id number]) ByAlecia Lemming.pdf Page 9 of 9 Signed: 05/02/2023 3:15:01 PM aylor Entered By: Samuella Bruin on 05/02/2023 09:52:53 -------------------------------------------------------------------------------- Vitals Details Patient Name: Date of Service: [name], [name] 05/02/2023 12:45 PM Medical Record Number: [id number] Patient Account Number: [id number] Date of Birth/Sex: Treating RN: Dec 23, 1955 (67 y.o. [name] Primary Care Twanna Resh: Cranford Mon Other Clinician: Referring Jennafer Gladue: Treating Tallis Soledad/Extender: Cassie Freer in Treatment: 0 Vital  Signs Time Taken: 12:35 Temperature (F): 97.8 Height (in): 66 Pulse (bpm): 90 Source: Stated Respiratory Rate (breaths/min): 16 Weight (lbs): 158 Blood Pressure (mmHg): 125/90 Source: Stated Reference Range: 80 - 120 mg / dl Body Mass Index (BMI): 25.5 Electronic Signature(s) Signed: 05/02/2023 3:15:01 PM By: Samuella Bruin Entered By: Samuella Bruin on 05/02/2023 09:35:48  3 Electronic Signature(s) Signed: 05/02/2023 3:15:01 PM By: Samuella Bruin Entered By: Samuella Bruin on 05/02/2023 10:02:12 [name] ([id number]) [id number] Page 3 of 9 -------------------------------------------------------------------------------- Encounter Discharge Information Details Patient Name: Date of Service: [name], [name] 05/02/2023 12:45 PM Medical Record Number: [id number] Patient Account Number: [id number] Date of Birth/Sex: Treating RN: [birth year] (67 y.o. [name] Primary Care Tanicka Bisaillon: Cranford Mon Other Clinician: Referring Xianna Siverling: Treating Mahima Hottle/Extender: Cassie Freer in Treatment: 0 Encounter Discharge Information Items Post Procedure Vitals Discharge Condition: Stable Temperature (F): 97.8 Ambulatory Status: Ambulatory Pulse (bpm): 90 Discharge Destination: Home Respiratory Rate (breaths/min): 16 Transportation: Private Auto Blood Pressure (mmHg): 125/90 Accompanied By: husband Schedule Follow-up Appointment: Yes Clinical Summary of Care: Patient Declined Electronic Signature(s) Signed: 05/02/2023 3:15:01 PM By: Samuella Bruin Entered By: Samuella Bruin on 05/02/2023 10:18:45 -------------------------------------------------------------------------------- Lower Extremity Assessment Details Patient Name: Date of Service: [name], [name] 05/02/2023 12:45 PM Medical Record Number: [id number] Patient Account Number: [id number] Date of Birth/Sex: Treating RN: [birth year] (67 y.o. [name] Primary Care Ulonda Klosowski: Cranford Mon Other Clinician: Referring Curt Oatis: Treating Breelynn Bankert/Extender: Cassie Freer in Treatment: 0 Edema Assessment Assessed: [Left: No] [Right: No] [Left: Edema] [Right: :] Calf Left: Right: Point of Measurement: From Medial Instep 36.4 cm Ankle Left: Right: Point of Measurement: From Medial Instep 22.1 cm Vascular Assessment Pulses: Dorsalis Pedis Palpable: [Right:Yes] Doppler Audible: [Right:Yes] Extremity colors, hair growth, and conditions: Extremity Color: [Right:Normal] Hair Growth on Extremity: [Right:Yes] Temperature of Extremity: [Right:Warm] Capillary Refill: [Right:< 3 seconds] Dependent Rubor: [Right:No] Blanched when Elevated: [Right:No] Lipodermatosclerosis: [Right:No] Blood Pressure: Corp, Antonetta ([id number]) [Right:[id number] Page 4 of 9] Brachial: [Right:125] Ankle: [Right:Dorsalis Pedis: 144 1.15] Toe Nail Assessment Left: Right: Thick: Yes Discolored: No Deformed: No Improper Length and Hygiene: No Electronic Signature(s) Signed: 05/02/2023 3:15:01 PM By: Samuella Bruin Entered By: Samuella Bruin on 05/02/2023 09:47:44 -------------------------------------------------------------------------------- Multi Wound Chart Details Patient Name: Date of Service: [name], [name] 05/02/2023 12:45 PM Medical Record Number: [id number] Patient Account Number: [id number] Date of Birth/Sex: Treating RN: May 24, 1956 (66 y.o. F) Primary Care Lakeisha Waldrop: Cranford Mon Other Clinician: Referring Taiylor Virden: Treating Leilan Bochenek/Extender: Cassie Freer in Treatment: 0 Vital Signs Height(in): 66 Pulse(bpm): 90 Weight(lbs): 158 Blood Pressure(mmHg): 125/90 Body Mass Index(BMI): 25.5 Temperature(F): 97.8 Respiratory Rate(breaths/min): 16 [1:Photos:] [N/A:N/A] Right T Great oe N/A N/A Wound Location: Pressure Injury N/A N/A Wounding  Event: Neuropathic Ulcer-Non Diabetic N/A N/A Primary Etiology: Hypertension, Rheumatoid Arthritis, N/A N/A Comorbid History: Neuropathy 12/27/2022 N/A N/A Date Acquired: 0 N/A N/A Weeks of Treatment: Open N/A N/A Wound Status: No N/A N/A Wound Recurrence: 0.3x0.3x0.2 N/A N/A Measurements L x W x D (cm) 0.071 N/A N/A A (cm) : rea 0.014 N/A N/A Volume (cm) : 1 Starting Position 1 (o'clock): 6 Ending Position 1 (o'clock): 0.3 Maximum Distance 1 (cm): Yes N/A N/A Undermining: Full Thickness Without Exposed N/A N/A Classification: Support Structures Medium N/A N/A Exudate Amount: Serosanguineous N/A N/A Exudate Type: red, brown N/A N/A Exudate Color: Distinct, outline attached N/A N/A Wound Margin: Large (67-100%) N/A N/A Granulation Amount: Red, Pink N/A N/A Granulation Quality: Small (1-33%) N/A N/A Necrotic Amount: Fat Layer (Subcutaneous Tissue): Yes N/A N/A Exposed Structures: [name] ([id number]) [id number] Page 5 of 9 Fascia: No Tendon: No Muscle: No Joint: No Bone: No Small (1-33%) N/A N/A Epithelialization: Debridement - Excisional N/A N/A Debridement: Pre-procedure Verification/Time Out 12:57 N/A N/A Taken: Lidocaine 4% Topical Solution N/A N/A Pain Control: Callus, Subcutaneous, Slough N/A N/A Tissue Debrided:  3 Electronic Signature(s) Signed: 05/02/2023 3:15:01 PM By: Samuella Bruin Entered By: Samuella Bruin on 05/02/2023 10:02:12 [name] ([id number]) [id number] Page 3 of 9 -------------------------------------------------------------------------------- Encounter Discharge Information Details Patient Name: Date of Service: [name], [name] 05/02/2023 12:45 PM Medical Record Number: [id number] Patient Account Number: [id number] Date of Birth/Sex: Treating RN: [birth year] (67 y.o. [name] Primary Care Tanicka Bisaillon: Cranford Mon Other Clinician: Referring Xianna Siverling: Treating Mahima Hottle/Extender: Cassie Freer in Treatment: 0 Encounter Discharge Information Items Post Procedure Vitals Discharge Condition: Stable Temperature (F): 97.8 Ambulatory Status: Ambulatory Pulse (bpm): 90 Discharge Destination: Home Respiratory Rate (breaths/min): 16 Transportation: Private Auto Blood Pressure (mmHg): 125/90 Accompanied By: husband Schedule Follow-up Appointment: Yes Clinical Summary of Care: Patient Declined Electronic Signature(s) Signed: 05/02/2023 3:15:01 PM By: Samuella Bruin Entered By: Samuella Bruin on 05/02/2023 10:18:45 -------------------------------------------------------------------------------- Lower Extremity Assessment Details Patient Name: Date of Service: [name], [name] 05/02/2023 12:45 PM Medical Record Number: [id number] Patient Account Number: [id number] Date of Birth/Sex: Treating RN: [birth year] (67 y.o. [name] Primary Care Ulonda Klosowski: Cranford Mon Other Clinician: Referring Curt Oatis: Treating Breelynn Bankert/Extender: Cassie Freer in Treatment: 0 Edema Assessment Assessed: [Left: No] [Right: No] [Left: Edema] [Right: :] Calf Left: Right: Point of Measurement: From Medial Instep 36.4 cm Ankle Left: Right: Point of Measurement: From Medial Instep 22.1 cm Vascular Assessment Pulses: Dorsalis Pedis Palpable: [Right:Yes] Doppler Audible: [Right:Yes] Extremity colors, hair growth, and conditions: Extremity Color: [Right:Normal] Hair Growth on Extremity: [Right:Yes] Temperature of Extremity: [Right:Warm] Capillary Refill: [Right:< 3 seconds] Dependent Rubor: [Right:No] Blanched when Elevated: [Right:No] Lipodermatosclerosis: [Right:No] Blood Pressure: Corp, Antonetta ([id number]) [Right:[id number].pdf Page 4 of 9] Brachial: [Right:125] Ankle: [Right:Dorsalis Pedis: 144 1.15] Toe Nail Assessment Left: Right: Thick: Yes Discolored: No Deformed: No Improper Length and Hygiene: No Electronic Signature(s) Signed: 05/02/2023 3:15:01 PM By: Samuella Bruin Entered By: Samuella Bruin on 05/02/2023 09:47:44 -------------------------------------------------------------------------------- Multi Wound Chart Details Patient Name: Date of Service: [name], [name] 05/02/2023 12:45 PM Medical Record Number: [id number] Patient Account Number: [id number] Date of Birth/Sex: Treating RN: May 24, 1956 (66 y.o. F) Primary Care Lakeisha Waldrop: Cranford Mon Other Clinician: Referring Taiylor Virden: Treating Leilan Bochenek/Extender: Cassie Freer in Treatment: 0 Vital Signs Height(in): 66 Pulse(bpm): 90 Weight(lbs): 158 Blood Pressure(mmHg): 125/90 Body Mass Index(BMI): 25.5 Temperature(F): 97.8 Respiratory Rate(breaths/min): 16 [1:Photos:] [N/A:N/A] Right T Great oe N/A N/A Wound Location: Pressure Injury N/A N/A Wounding  Event: Neuropathic Ulcer-Non Diabetic N/A N/A Primary Etiology: Hypertension, Rheumatoid Arthritis, N/A N/A Comorbid History: Neuropathy 12/27/2022 N/A N/A Date Acquired: 0 N/A N/A Weeks of Treatment: Open N/A N/A Wound Status: No N/A N/A Wound Recurrence: 0.3x0.3x0.2 N/A N/A Measurements L x W x D (cm) 0.071 N/A N/A A (cm) : rea 0.014 N/A N/A Volume (cm) : 1 Starting Position 1 (o'clock): 6 Ending Position 1 (o'clock): 0.3 Maximum Distance 1 (cm): Yes N/A N/A Undermining: Full Thickness Without Exposed N/A N/A Classification: Support Structures Medium N/A N/A Exudate Amount: Serosanguineous N/A N/A Exudate Type: red, brown N/A N/A Exudate Color: Distinct, outline attached N/A N/A Wound Margin: Large (67-100%) N/A N/A Granulation Amount: Red, Pink N/A N/A Granulation Quality: Small (1-33%) N/A N/A Necrotic Amount: Fat Layer (Subcutaneous Tissue): Yes N/A N/A Exposed Structures: [name] ([id number]) [id number] Page 5 of 9 Fascia: No Tendon: No Muscle: No Joint: No Bone: No Small (1-33%) N/A N/A Epithelialization: Debridement - Excisional N/A N/A Debridement: Pre-procedure Verification/Time Out 12:57 N/A N/A Taken: Lidocaine 4% Topical Solution N/A N/A Pain Control: Callus, Subcutaneous, Slough N/A N/A Tissue Debrided:  [name], [name] ([id number]) [id number] Page 1 of 9 Visit Report for 05/02/2023 Allergy List Details Patient Name: Date of Service: [name], [name] 05/02/2023 12:45 PM Medical Record Number: [id number] Patient Account Number: [id number] Date of Birth/Sex: Treating RN: 1956/05/29 (67 y.o. [name] Primary Care Brittnay Pigman: Cranford Mon Other Clinician: Referring Johara Lodwick: Treating Kvon Mcilhenny/Extender: Eloy End Weeks in Treatment: 0 Allergies Active Allergies abatacept upadacitinib etanercept tocilizumab Allergy Notes Electronic Signature(s) Signed: 05/02/2023 3:15:01 PM By: Samuella Bruin Entered By: Samuella Bruin on 05/01/2023 06:03:59 -------------------------------------------------------------------------------- Arrival Information Details Patient Name: Date of Service: [name], [name] 05/02/2023 12:45 PM Medical Record Number: [id number] Patient Account Number: [id number] Date of Birth/Sex: Treating RN: June 18, 1956 (68 y.o. [name] Primary Care Danialle Dement: Cranford Mon Other Clinician: Referring Cheyan Frees: Treating Marijke Guadiana/Extender: Cassie Freer in Treatment: 0 Visit Information Patient Arrived: Ambulatory Arrival Time: 12:32 Accompanied By: husband Transfer Assistance: None Patient Identification Verified: Yes Secondary Verification Process Completed: Yes Electronic Signature(s) Signed: 05/02/2023 3:15:01 PM By: Samuella Bruin Entered By: Samuella Bruin on 05/02/2023 09:35:26 [name] ([id number]) [id number] Page 2 of 9 -------------------------------------------------------------------------------- Clinic Level of Care Assessment Details Patient Name: Date of Service: [name], [name] 05/02/2023 12:45 PM Medical Record Number: [id number] Patient Account Number: [id number] Date of Birth/Sex: Treating RN: [birth year] (66  y.o. [name] Primary Care Ronan Duecker: Cranford Mon Other Clinician: Referring Adriel Kessen: Treating Meyer Arora/Extender: Cassie Freer in Treatment: 0 Clinic Level of Care Assessment Items TOOL 1 Quantity Score X- 1 0 Use when EandM and Procedure is performed on INITIAL visit ASSESSMENTS - Nursing Assessment / Reassessment X- 1 20 General Physical Exam (combine w/ comprehensive assessment (listed just below) when performed on new pt. evals) X- 1 25 Comprehensive Assessment (HX, ROS, Risk Assessments, Wounds Hx, etc.) ASSESSMENTS - Wound and Skin Assessment / Reassessment []  - 0 Dermatologic / Skin Assessment (not related to wound area) ASSESSMENTS - Ostomy and/or Continence Assessment and Care []  - 0 Incontinence Assessment and Management []  - 0 Ostomy Care Assessment and Management (repouching, etc.) PROCESS - Coordination of Care X - Simple Patient / Family Education for ongoing care 1 15 []  - 0 Complex (extensive) Patient / Family Education for ongoing care X- 1 10 Staff obtains Chiropractor, Records, T Results / Process Orders est []  - 0 Staff telephones HHA, Nursing Homes / Clarify orders / etc []  - 0 Routine Transfer to another Facility (non-emergent condition) []  - 0 Routine Hospital Admission (non-emergent condition) X- 1 15 New Admissions / Manufacturing engineer / Ordering NPWT Apligraf, etc. , []  - 0 Emergency Hospital Admission (emergent condition) PROCESS - Special Needs []  - 0 Pediatric / Minor Patient Management []  - 0 Isolation Patient Management []  - 0 Hearing / Language / Visual special needs []  - 0 Assessment of Community assistance (transportation, D/C planning, etc.) []  - 0 Additional assistance / Altered mentation []  - 0 Support Surface(s) Assessment (bed, cushion, seat, etc.) INTERVENTIONS - Miscellaneous []  - 0 External ear exam []  - 0 Patient Transfer (multiple staff / Nurse, adult / Similar devices) []  -  0 Simple Staple / Suture removal (25 or less) []  - 0 Complex Staple / Suture removal (26 or more) []  - 0 Hypo/Hyperglycemic Management (do not check if billed separately) X- 1 15 Ankle / Brachial Index (ABI) - do not check if billed separately Has the patient been seen at the hospital within the last three years: Yes Total Score: 100 Level Of Care: New/Established - Level

## 2023-05-02 NOTE — Progress Notes (Signed)
Number: [id number] Patient Account Number: [id number] Date of Birth/Sex: Treating RN: [birth year] (67 y.o. F) Primary Care Provider: Cranford Mon Other Clinician: Referring Provider: Treating Provider/Extender: Cassie Freer in Treatment: 0 Subjective Chief Complaint Information obtained from Patient Patient seen for complaints of Non-Healing Wound. History of Present Illness (HPI) ADMISSION ***ABI R: 1.15*** This is a 67 year old woman with a longstanding history of tobacco abuse, smoking a pack per day since the age of 29. She also has rheumatoid arthritis for which she receives immunomodulators. She was referred by podiatry  for further evaluation and management of a right great toe ulcer. In May of this year, she had a callus debridement that revealed an open wound underlying the callus. It has apparently been managed with Betadine dressing changes and a [name], [name] ([id number]) [id number] Page 5 of 9 surgical shoe for offloading. There was some question that she had peripheral neuropathy, but she underwent EMG testing that was negative for neuropathy and radiculopathy. The neurologist suggested that she undergo vascular studies, but I do not see that these have been performed. Her most recent visit with podiatry was on September 30. The wound still had not healed as a result, she was referred to the wound care center for further evaluation and treatment. Patient History Information obtained from Patient, Chart. Allergies abatacept, upadacitinib, etanercept, tocilizumab Family History Diabetes - Mother, Heart Disease - Father, Hypertension - Father, Kidney Disease - Father, Thyroid Problems - Father, No family history of Cancer, Hereditary Spherocytosis, Lung Disease, Seizures, Stroke, Tuberculosis. Social History Current every day smoker - 1 ppd, Marital Status - Married, Alcohol Use - Never, Drug Use - No History, Caffeine Use - Daily. Medical History Cardiovascular Patient has history of Hypertension Endocrine Denies history of Type II Diabetes Musculoskeletal Patient has history of Rheumatoid Arthritis Hospitalization/Surgery History - Colonoscopy. - Cesarean section. - Tubal ligation. Medical A Surgical History Notes nd Gastrointestinal colon polyps Integumentary (Skin) viral warts Review of Systems (ROS) Constitutional Symptoms (General Health) Denies complaints or symptoms of Fatigue, Fever, Chills, Marked Weight Change. Eyes Denies complaints or symptoms of Dry Eyes, Vision Changes, Glasses / Contacts. Ear/Nose/Mouth/Throat Denies complaints or symptoms of  Chronic sinus problems or rhinitis. Respiratory Denies complaints or symptoms of Chronic or frequent coughs, Shortness of Breath. Endocrine Denies complaints or symptoms of Heat/cold intolerance. Genitourinary Denies complaints or symptoms of Frequent urination. Integumentary (Skin) Complains or has symptoms of Wounds. Neurologic Denies complaints or symptoms of Numbness/parasthesias. Psychiatric Denies complaints or symptoms of Claustrophobia. Objective Constitutional No acute distress. Vitals Time Taken: 12:35 PM, Height: 66 in, Source: Stated, Weight: 158 lbs, Source: Stated, BMI: 25.5, Temperature: 97.8 F, Pulse: 90 bpm, Respiratory Rate: 16 breaths/min, Blood Pressure: 125/90 mmHg. Respiratory Normal work of breathing on room air. General Notes: 05/02/2023: On the plantar aspect of her right great toe, she has a small circular ulcer surrounded with callus. There is some slough accumulation on the surface. Integumentary (Hair, Skin) Wound #1 status is Open. Original cause of wound was Pressure Injury. The date acquired was: 12/27/2022. The wound is located on the Right T Great. The oe wound measures 0.3cm length x 0.3cm width x 0.2cm depth; 0.071cm^2 area and 0.014cm^3 volume. There is Fat Layer (Subcutaneous Tissue) exposed. There is no tunneling noted, however, there is undermining starting at 1:00 and ending at 6:00 with a maximum distance of 0.3cm. There is a medium amount of serosanguineous drainage noted. The wound margin is distinct with the outline attached to the wound base.  [name] ([id number]) [id number] Page 1 of 9 Visit Report for 05/02/2023 Chief Complaint Document Details Patient Name: Date of Service: [name], [name] 05/02/2023 12:45 PM Medical Record Number: [id number] Patient Account Number: [id number] Date of Birth/Sex: Treating RN: 05/02/1956 (67 y.o. F) Primary Care Provider: Cranford Mon Other Clinician: Referring Provider: Treating Provider/Extender: Cassie Freer in Treatment: 0 Information Obtained from: Patient Chief Complaint Patient seen for complaints of Non-Healing Wound. Electronic Signature(s) Signed: 05/02/2023 12:44:24 PM By: Duanne Guess MD FACS Entered By: Duanne Guess on 05/02/2023 12:44:23 -------------------------------------------------------------------------------- Debridement Details Patient Name: Date of Service: [name], [name] 05/02/2023 12:45 PM Medical Record Number: [id number] Patient Account Number: [id number] Date of Birth/Sex: Treating RN: November 13, 1955 (67 y.o. [name] Primary Care Provider: Cranford Mon Other Clinician: Referring Provider: Treating Provider/Extender: Cassie Freer in Treatment: 0 Debridement Performed for Assessment: Wound #1 Right T Great oe Performed By: Physician Duanne Guess, MD The following information was scribed by: Samuella Bruin The information was scribed for: Duanne Guess Debridement Type: Debridement Level of Consciousness (Pre-procedure): Awake and Alert Pre-procedure Verification/Time Out Yes - 12:57 Taken: Start Time: 12:57 Pain Control: Lidocaine 4% Topical Solution Percent of Wound Bed Debrided: 100% T Area Debrided (cm): otal 0.07 Tissue and other material debrided: Non-Viable, Callus, Slough, Subcutaneous, Slough Level: Skin/Subcutaneous Tissue Debridement Description: Excisional Instrument: Curette Bleeding: Minimum Hemostasis Achieved:  Pressure Response to Treatment: Procedure was tolerated well Level of Consciousness (Post- Awake and Alert procedure): Post Debridement Measurements of Total Wound Length: (cm) 0.3 Width: (cm) 0.3 Depth: (cm) 0.2 Volume: (cm) 0.014 Character of Wound/Ulcer Post Debridement: Improved Post Procedure Diagnosis [name] ([id number]) [id number] Page 2 of 9 Same as Pre-procedure Electronic Signature(s) Signed: 05/02/2023 1:54:21 PM By: Duanne Guess MD FACS Signed: 05/02/2023 3:15:01 PM By: Gelene Mink By: Samuella Bruin on 05/02/2023 13:01:07 -------------------------------------------------------------------------------- HPI Details Patient Name: Date of Service: [name], [name] 05/02/2023 12:45 PM Medical Record Number: [id number] Patient Account Number: [id number] Date of Birth/Sex: Treating RN: July 11, 1956 (67 y.o. F) Primary Care Provider: Cranford Mon Other Clinician: Referring Provider: Treating Provider/Extender: Cassie Freer in Treatment: 0 History of Present Illness HPI Description: ADMISSION ***ABI R: 1.15*** This is a 67 year old woman with a longstanding history of tobacco abuse, smoking a pack per day since the age of 51. She also has rheumatoid arthritis for which she receives immunomodulators. She was referred by podiatry for further evaluation and management of a right great toe ulcer. In May of this year, she had a callus debridement that revealed an open wound underlying the callus. It has apparently been managed with Betadine dressing changes and a surgical shoe for offloading. There was some question that she had peripheral neuropathy, but she underwent EMG testing that was negative for neuropathy and radiculopathy. The neurologist suggested that she undergo vascular studies, but I do not see that these have been performed. Her most recent visit with podiatry was on September 30. The  wound still had not healed as a result, she was referred to the wound care center for further evaluation and treatment. Electronic Signature(s) Signed: 05/02/2023 1:05:08 PM By: Duanne Guess MD FACS Previous Signature: 05/02/2023 12:47:59 PM Version By: Duanne Guess MD FACS Entered By: Duanne Guess on 05/02/2023 13:05:07 -------------------------------------------------------------------------------- Physical Exam Details Patient Name: Date of Service: [name], [name] 05/02/2023 12:45 PM Medical Record Number: [id number] Patient Account Number: [id number] Date of Birth/Sex: Treating RN: 1956-07-06 (68 y.o. F) Primary Care Provider: Cranford Mon Other  Clinician: Referring Provider: Treating Provider/Extender: Eloy End Weeks in Treatment: 0 Constitutional . . . . No acute distress. Respiratory Normal work of breathing on room air. Notes 05/02/2023: On the plantar aspect of her right great toe, she has a small circular ulcer surrounded with callus. There is some slough accumulation on the surface. Electronic Signature(s) Signed: 05/02/2023 1:09:05 PM By: Duanne Guess MD FACS Entered By: Duanne Guess on 05/02/2023 13:09:05 [name] ([id number]) [id number] Page 3 of 9 -------------------------------------------------------------------------------- Physician Orders Details Patient Name: Date of Service: [name], [name] 05/02/2023 12:45 PM Medical Record Number: [id number] Patient Account Number: [id number] Date of Birth/Sex: Treating RN: 1956-03-13 (67 y.o. [name] Primary Care Provider: Cranford Mon Other Clinician: Referring Provider: Treating Provider/Extender: Cassie Freer in Treatment: 0 The following information was scribed by: Samuella Bruin The information was scribed for: Duanne Guess Verbal / Phone Orders: No Diagnosis Coding ICD-10 Coding Code  Description [phone] Non-pressure chronic ulcer of other part of right foot with fat layer exposed Z79.69 Long term (current) use of other immunomodulators and immunosuppressants Z72.0 Tobacco use Follow-up Appointments ppointment in 1 week. - Dr. Lady Gary - room 2 Return A Anesthetic (In clinic) Topical Lidocaine 5% applied to wound bed Bathing/ Shower/ Hygiene May shower and wash wound with soap and water. Off-Loading Other: - forefoot offloading shoe to right foot Additional Orders / Instructions Stop/Decrease Smoking Follow Nutritious Diet - 70-100 grams of protein a day Wound Treatment Wound #1 - T Great oe Wound Laterality: Right Cleanser: Soap and Water 1 x Per Day/30 Days Discharge Instructions: May shower and wash wound with dial antibacterial soap and water prior to dressing change. Cleanser: Wound Cleanser 1 x Per Day/30 Days Discharge Instructions: Cleanse the wound with wound cleanser prior to applying a clean dressing using gauze sponges, not tissue or cotton balls. Prim Dressing: Promogran Prisma Matrix, 4.34 (sq in) (silver collagen) 1 x Per Day/30 Days ary Discharge Instructions: Moisten collagen with saline or hydrogel Secondary Dressing: Woven Gauze Sponges 2x2 in 1 x Per Day/30 Days Discharge Instructions: Apply over primary dressing as directed. Secured With: Insurance underwriter, Sterile 2x75 (in/in) 1 x Per Day/30 Days Discharge Instructions: Secure with stretch gauze as directed. Secured With: Paper Tape, 2x10 (in/yd) 1 x Per Day/30 Days Discharge Instructions: Secure dressing with tape as directed. Patient Medications llergies: abatacept, upadacitinib, etanercept, tocilizumab A Notifications Medication Indication Start End 05/02/2023 lidocaine DOSE topical 5 % ointment - ointment topical Electronic Signature(s) Signed: 05/02/2023 1:54:21 PM By: Duanne Guess MD FACS [place], [name] ([id number]) By: Duanne Guess MD FACS  [id number].pdf Page 4 of 9 Signed: 05/02/2023 1:54:21 PM Entered By: Duanne Guess on 05/02/2023 13:09:37 -------------------------------------------------------------------------------- Problem List Details Patient Name: Date of Service: Tammy, Key 05/02/2023 12:45 PM Medical Record Number: [id number] Patient Account Number: [id number] Date of Birth/Sex: Treating RN: 1956-03-08 (67 y.o. F) Primary Care Provider: Cranford Mon Other Clinician: Referring Provider: Treating Provider/Extender: Cassie Freer in Treatment: 0 Active Problems ICD-10 Encounter Code Description Active Date MDM Diagnosis L97.512 Non-pressure chronic ulcer of other part of right foot with fat layer exposed 05/02/2023 No Yes Z79.69 Long term (current) use of other immunomodulators and immunosuppressants 05/02/2023 No Yes Z72.0 Tobacco use 05/02/2023 No Yes Inactive Problems Resolved Problems Electronic Signature(s) Signed: 05/02/2023 12:43:29 PM By: Duanne Guess MD FACS Entered By: Duanne Guess on 05/02/2023 12:43:29 -------------------------------------------------------------------------------- Progress Note Details Patient Name: Date of Service: [name] 05/02/2023 12:45 PM Medical Record  Number: [id number] Patient Account Number: [id number] Date of Birth/Sex: Treating RN: [birth year] (67 y.o. F) Primary Care Provider: Cranford Mon Other Clinician: Referring Provider: Treating Provider/Extender: Cassie Freer in Treatment: 0 Subjective Chief Complaint Information obtained from Patient Patient seen for complaints of Non-Healing Wound. History of Present Illness (HPI) ADMISSION ***ABI R: 1.15*** This is a 67 year old woman with a longstanding history of tobacco abuse, smoking a pack per day since the age of 29. She also has rheumatoid arthritis for which she receives immunomodulators. She was referred by podiatry  for further evaluation and management of a right great toe ulcer. In May of this year, she had a callus debridement that revealed an open wound underlying the callus. It has apparently been managed with Betadine dressing changes and a [name], [name] ([id number]) [id number] Page 5 of 9 surgical shoe for offloading. There was some question that she had peripheral neuropathy, but she underwent EMG testing that was negative for neuropathy and radiculopathy. The neurologist suggested that she undergo vascular studies, but I do not see that these have been performed. Her most recent visit with podiatry was on September 30. The wound still had not healed as a result, she was referred to the wound care center for further evaluation and treatment. Patient History Information obtained from Patient, Chart. Allergies abatacept, upadacitinib, etanercept, tocilizumab Family History Diabetes - Mother, Heart Disease - Father, Hypertension - Father, Kidney Disease - Father, Thyroid Problems - Father, No family history of Cancer, Hereditary Spherocytosis, Lung Disease, Seizures, Stroke, Tuberculosis. Social History Current every day smoker - 1 ppd, Marital Status - Married, Alcohol Use - Never, Drug Use - No History, Caffeine Use - Daily. Medical History Cardiovascular Patient has history of Hypertension Endocrine Denies history of Type II Diabetes Musculoskeletal Patient has history of Rheumatoid Arthritis Hospitalization/Surgery History - Colonoscopy. - Cesarean section. - Tubal ligation. Medical A Surgical History Notes nd Gastrointestinal colon polyps Integumentary (Skin) viral warts Review of Systems (ROS) Constitutional Symptoms (General Health) Denies complaints or symptoms of Fatigue, Fever, Chills, Marked Weight Change. Eyes Denies complaints or symptoms of Dry Eyes, Vision Changes, Glasses / Contacts. Ear/Nose/Mouth/Throat Denies complaints or symptoms of  Chronic sinus problems or rhinitis. Respiratory Denies complaints or symptoms of Chronic or frequent coughs, Shortness of Breath. Endocrine Denies complaints or symptoms of Heat/cold intolerance. Genitourinary Denies complaints or symptoms of Frequent urination. Integumentary (Skin) Complains or has symptoms of Wounds. Neurologic Denies complaints or symptoms of Numbness/parasthesias. Psychiatric Denies complaints or symptoms of Claustrophobia. Objective Constitutional No acute distress. Vitals Time Taken: 12:35 PM, Height: 66 in, Source: Stated, Weight: 158 lbs, Source: Stated, BMI: 25.5, Temperature: 97.8 F, Pulse: 90 bpm, Respiratory Rate: 16 breaths/min, Blood Pressure: 125/90 mmHg. Respiratory Normal work of breathing on room air. General Notes: 05/02/2023: On the plantar aspect of her right great toe, she has a small circular ulcer surrounded with callus. There is some slough accumulation on the surface. Integumentary (Hair, Skin) Wound #1 status is Open. Original cause of wound was Pressure Injury. The date acquired was: 12/27/2022. The wound is located on the Right T Great. The oe wound measures 0.3cm length x 0.3cm width x 0.2cm depth; 0.071cm^2 area and 0.014cm^3 volume. There is Fat Layer (Subcutaneous Tissue) exposed. There is no tunneling noted, however, there is undermining starting at 1:00 and ending at 6:00 with a maximum distance of 0.3cm. There is a medium amount of serosanguineous drainage noted. The wound margin is distinct with the outline attached to the wound base.  05/02/2023 1:16:59 PM By: Duanne Guess MD FACS Previous Signature: 05/02/2023 1:09:49 PM Version By: Duanne Guess MD FACS Entered By: Duanne Guess on 05/02/2023 13:16:59 [name] ([id number]) [id number] Page 7 of 9 -------------------------------------------------------------------------------- HxROS  Details Patient Name: Date of Service: [name], [name] 05/02/2023 12:45 PM Medical Record Number: [id number] Patient Account Number: [id number] Date of Birth/Sex: Treating RN: 09/14/1955 (67 y.o. [name] Primary Care Provider: Cranford Mon Other Clinician: Referring Provider: Treating Provider/Extender: Cassie Freer in Treatment: 0 Information Obtained From Patient Chart Constitutional Symptoms (General Health) Complaints and Symptoms: Negative for: Fatigue; Fever; Chills; Marked Weight Change Eyes Complaints and Symptoms: Negative for: Dry Eyes; Vision Changes; Glasses / Contacts Ear/Nose/Mouth/Throat Complaints and Symptoms: Negative for: Chronic sinus problems or rhinitis Respiratory Complaints and Symptoms: Negative for: Chronic or frequent coughs; Shortness of Breath Endocrine Complaints and Symptoms: Negative for: Heat/cold intolerance Medical History: Negative for: Type II Diabetes Genitourinary Complaints and Symptoms: Negative for: Frequent urination Integumentary (Skin) Complaints and Symptoms: Positive for: Wounds Medical History: Past Medical History Notes: viral warts Neurologic Complaints and Symptoms: Negative for: Numbness/parasthesias Psychiatric Complaints and Symptoms: Negative for: Claustrophobia Hematologic/Lymphatic Cardiovascular Medical History: Positive for: Hypertension Gastrointestinal Medical History: Past Medical History Notes: colon polyps [name] ([id number]) [id number] Page 8 of 9 Immunological Musculoskeletal Medical History: Positive for: Rheumatoid Arthritis Oncologic Immunizations Pneumococcal Vaccine: Received Pneumococcal Vaccination: Yes Received Pneumococcal Vaccination On or After 60th Birthday: Yes Implantable Devices None Hospitalization / Surgery History Type of Hospitalization/Surgery Colonoscopy Cesarean section Tubal  ligation Family and Social History Cancer: No; Diabetes: Yes - Mother; Heart Disease: Yes - Father; Hereditary Spherocytosis: No; Hypertension: Yes - Father; Kidney Disease: Yes - Father; Lung Disease: No; Seizures: No; Stroke: No; Thyroid Problems: Yes - Father; Tuberculosis: No; Current every day smoker - 1 ppd; Marital Status - Married; Alcohol Use: Never; Drug Use: No History; Caffeine Use: Daily; Financial Concerns: No; Food, Clothing or Shelter Needs: No; Support System Lacking: No; Transportation Concerns: No Electronic Signature(s) Signed: 05/02/2023 1:54:21 PM By: Duanne Guess MD FACS Signed: 05/02/2023 3:15:01 PM By: Samuella Bruin Entered By: Samuella Bruin on 05/02/2023 12:58:01 -------------------------------------------------------------------------------- SuperBill Details Patient Name: Date of Service: [name], [name] 05/02/2023 Medical Record Number: [id number] Patient Account Number: [id number] Date of Birth/Sex: Treating RN: 27-Jan-1956 (67 y.o. F) Primary Care Provider: Cranford Mon Other Clinician: Referring Provider: Treating Provider/Extender: Cassie Freer in Treatment: 0 Diagnosis Coding ICD-10 Codes Code Description [phone] Non-pressure chronic ulcer of other part of right foot with fat layer exposed Z79.69 Long term (current) use of other immunomodulators and immunosuppressants Z72.0 Tobacco use Facility Procedures : CPT4 Code: [id number] Description: 11042 - DEB SUBQ TISSUE 20 SQ CM/< ICD-10 Diagnosis Description L97.512 Non-pressure chronic ulcer of other part of right foot with fat layer exposed Modifier: Quantity: 1 Physician Procedures : CPT4 Code Description Modifier [id number] 99204 - WC PHYS LEVEL 4 - NEW PT 25 ICD-10 Diagnosis Description L97.512 Non-pressure chronic ulcer of other part of right foot with fat layer exposed Monds, Velicia ([id number]) [id number]  L97.512 Non-pressure chronic ulcer  of other part of right foot with fat layer exposed Z79.69 Long term (current) use of other immunomodulators and immunosuppressants Z72.0 Tobacco use Quantity: 1 [id number].pdf Page 9 of 9 : [id number] 11042 - WC PHYS SUBQ TISS 20 SQ CM ICD-10 Diagnosis Description L97.512 Non-pressure chronic ulcer of other part of right foot with fat layer exposed Quantity: 1 Electronic Signature(s) Signed: 05/02/2023 1:18:39 PM By: Duanne Guess MD FACS Entered  05/02/2023 1:16:59 PM By: Duanne Guess MD FACS Previous Signature: 05/02/2023 1:09:49 PM Version By: Duanne Guess MD FACS Entered By: Duanne Guess on 05/02/2023 13:16:59 [name] ([id number]) [id number] Page 7 of 9 -------------------------------------------------------------------------------- HxROS  Details Patient Name: Date of Service: [name], [name] 05/02/2023 12:45 PM Medical Record Number: [id number] Patient Account Number: [id number] Date of Birth/Sex: Treating RN: 09/14/1955 (67 y.o. [name] Primary Care Provider: Cranford Mon Other Clinician: Referring Provider: Treating Provider/Extender: Cassie Freer in Treatment: 0 Information Obtained From Patient Chart Constitutional Symptoms (General Health) Complaints and Symptoms: Negative for: Fatigue; Fever; Chills; Marked Weight Change Eyes Complaints and Symptoms: Negative for: Dry Eyes; Vision Changes; Glasses / Contacts Ear/Nose/Mouth/Throat Complaints and Symptoms: Negative for: Chronic sinus problems or rhinitis Respiratory Complaints and Symptoms: Negative for: Chronic or frequent coughs; Shortness of Breath Endocrine Complaints and Symptoms: Negative for: Heat/cold intolerance Medical History: Negative for: Type II Diabetes Genitourinary Complaints and Symptoms: Negative for: Frequent urination Integumentary (Skin) Complaints and Symptoms: Positive for: Wounds Medical History: Past Medical History Notes: viral warts Neurologic Complaints and Symptoms: Negative for: Numbness/parasthesias Psychiatric Complaints and Symptoms: Negative for: Claustrophobia Hematologic/Lymphatic Cardiovascular Medical History: Positive for: Hypertension Gastrointestinal Medical History: Past Medical History Notes: colon polyps [name] ([id number]) [id number] Page 8 of 9 Immunological Musculoskeletal Medical History: Positive for: Rheumatoid Arthritis Oncologic Immunizations Pneumococcal Vaccine: Received Pneumococcal Vaccination: Yes Received Pneumococcal Vaccination On or After 60th Birthday: Yes Implantable Devices None Hospitalization / Surgery History Type of Hospitalization/Surgery Colonoscopy Cesarean section Tubal  ligation Family and Social History Cancer: No; Diabetes: Yes - Mother; Heart Disease: Yes - Father; Hereditary Spherocytosis: No; Hypertension: Yes - Father; Kidney Disease: Yes - Father; Lung Disease: No; Seizures: No; Stroke: No; Thyroid Problems: Yes - Father; Tuberculosis: No; Current every day smoker - 1 ppd; Marital Status - Married; Alcohol Use: Never; Drug Use: No History; Caffeine Use: Daily; Financial Concerns: No; Food, Clothing or Shelter Needs: No; Support System Lacking: No; Transportation Concerns: No Electronic Signature(s) Signed: 05/02/2023 1:54:21 PM By: Duanne Guess MD FACS Signed: 05/02/2023 3:15:01 PM By: Samuella Bruin Entered By: Samuella Bruin on 05/02/2023 12:58:01 -------------------------------------------------------------------------------- SuperBill Details Patient Name: Date of Service: [name], [name] 05/02/2023 Medical Record Number: [id number] Patient Account Number: [id number] Date of Birth/Sex: Treating RN: 27-Jan-1956 (67 y.o. F) Primary Care Provider: Cranford Mon Other Clinician: Referring Provider: Treating Provider/Extender: Cassie Freer in Treatment: 0 Diagnosis Coding ICD-10 Codes Code Description [phone] Non-pressure chronic ulcer of other part of right foot with fat layer exposed Z79.69 Long term (current) use of other immunomodulators and immunosuppressants Z72.0 Tobacco use Facility Procedures : CPT4 Code: [id number] Description: 11042 - DEB SUBQ TISSUE 20 SQ CM/< ICD-10 Diagnosis Description L97.512 Non-pressure chronic ulcer of other part of right foot with fat layer exposed Modifier: Quantity: 1 Physician Procedures : CPT4 Code Description Modifier [id number] 99204 - WC PHYS LEVEL 4 - NEW PT 25 ICD-10 Diagnosis Description L97.512 Non-pressure chronic ulcer of other part of right foot with fat layer exposed Monds, Velicia ([id number]) [id number]  L97.512 Non-pressure chronic ulcer  of other part of right foot with fat layer exposed Z79.69 Long term (current) use of other immunomodulators and immunosuppressants Z72.0 Tobacco use Quantity: 1 51227.pdf Page 9 of 9 : [id number] 11042 - WC PHYS SUBQ TISS 20 SQ CM ICD-10 Diagnosis Description L97.512 Non-pressure chronic ulcer of other part of right foot with fat layer exposed Quantity: 1 Electronic Signature(s) Signed: 05/02/2023 1:18:39 PM By: Duanne Guess MD FACS Entered

## 2023-05-09 DIAGNOSIS — M0579 Rheumatoid arthritis with rheumatoid factor of multiple sites without organ or systems involvement: Secondary | ICD-10-CM | POA: Diagnosis not present

## 2023-05-12 ENCOUNTER — Encounter (HOSPITAL_BASED_OUTPATIENT_CLINIC_OR_DEPARTMENT_OTHER): Payer: Medicare Other | Admitting: General Surgery

## 2023-05-12 DIAGNOSIS — I10 Essential (primary) hypertension: Secondary | ICD-10-CM | POA: Diagnosis not present

## 2023-05-12 DIAGNOSIS — F17210 Nicotine dependence, cigarettes, uncomplicated: Secondary | ICD-10-CM | POA: Diagnosis not present

## 2023-05-12 DIAGNOSIS — Z7969 Long term (current) use of other immunomodulators and immunosuppressants: Secondary | ICD-10-CM | POA: Diagnosis not present

## 2023-05-12 DIAGNOSIS — L97512 Non-pressure chronic ulcer of other part of right foot with fat layer exposed: Secondary | ICD-10-CM | POA: Diagnosis not present

## 2023-05-12 DIAGNOSIS — Z860100 Personal history of colon polyps, unspecified: Secondary | ICD-10-CM | POA: Diagnosis not present

## 2023-05-12 DIAGNOSIS — M069 Rheumatoid arthritis, unspecified: Secondary | ICD-10-CM | POA: Diagnosis not present

## 2023-05-12 NOTE — Progress Notes (Signed)
Medical Record Number: [id number] Patient Account Number: [id number] Date of Birth/Gender: Treating RN: 1955-11-11 (67 y.o. [name] Primary Care Physician: Cranford Mon Other Clinician: Referring Physician: Treating Physician/Extender: Cassie Freer in Treatment: 1 Education Assessment Education Provided  To: Patient Education Topics Provided Wound/Skin Impairment: Methods: Explain/Verbal Responses: State content correctly Electronic Signature(s) Signed: 05/12/2023 12:21:04 PM By: Karie Schwalbe RN Entered By: Karie Schwalbe on 05/12/2023 08:43:32 -------------------------------------------------------------------------------- Wound Assessment Details Patient Name: Date of Service: [name] 05/12/2023 10:30 A M Medical Record Number: [id number] Patient Account Number: [id number] Date of Birth/Sex: Treating RN: 08-08-1955 (67 y.o. F) Primary Care Braddock Servellon: Cranford Mon Other Clinician: Referring Han Lysne: Treating Aliece Honold/Extender: Roberta, Goldman, [name] ([id number]) [id number] Page 6 of 7 Weeks in Treatment: 1 Wound Status Wound Number: 1 Primary Etiology: Atypical Wound Location: Right T Great oe Wound Status: Open Wounding Event: Gradually Appeared Comorbid History: Hypertension, Rheumatoid Arthritis Date Acquired: 12/07/2022 Weeks Of Treatment: 1 Clustered Wound: No Photos Wound Measurements Length: (cm) 0.3 Width: (cm) 0.3 Depth: (cm) 0.2 Area: (cm) 0.071 Volume: (cm) 0.014 % Reduction in Area: 0% % Reduction in Volume: 0% Epithelialization: Small (1-33%) Tunneling: No Undermining: No Wound Description Classification: Full Thickness Without Exposed Support Structures Wound Margin: Distinct, outline attached Exudate Amount: Medium Exudate Type: Serosanguineous Exudate Color: red, brown Foul Odor After Cleansing: No Slough/Fibrino Yes Wound Bed Granulation Amount: Large (67-100%) Exposed Structure Granulation Quality: Red, Pink Fascia Exposed: No Necrotic Amount: Small (1-33%) Fat Layer (Subcutaneous Tissue) Exposed: Yes Necrotic Quality: Eschar, Adherent Slough Tendon Exposed: No Muscle Exposed: No Joint Exposed: No Bone Exposed: No Periwound Skin Texture Texture Color No Abnormalities  Noted: No No Abnormalities Noted: Yes Callus: Yes Temperature / Pain Temperature: No Abnormality Moisture No Abnormalities Noted: Yes Treatment Notes Wound #1 (Toe Great) Wound Laterality: Right Cleanser Soap and Water Discharge Instruction: May shower and wash wound with dial antibacterial soap and water prior to dressing change. Wound Cleanser Discharge Instruction: Cleanse the wound with wound cleanser prior to applying a clean dressing using gauze sponges, not tissue or cotton balls. Peri-Wound Care Topical Primary Dressing Promogran Prisma Matrix, 4.34 (sq in) (silver collagen) Discharge Instruction: Moisten collagen with saline or hydrogel Optifoam Non-Adhesive Dressing, 4x4 in Discharge Instruction: Cut out a "donut " shape to cushion [place], [name] ([id number]) [id number] Page 7 of 7 Secondary Dressing Woven Gauze Sponges 2x2 in Discharge Instruction: Apply over primary dressing as directed. Secured With Conforming Stretch Gauze Bandage, Sterile 2x75 (in/in) Discharge Instruction: Secure with stretch gauze as directed. Paper Tape, 2x10 (in/yd) Discharge Instruction: Secure dressing with tape as directed. Compression Wrap Compression Stockings Add-Ons Electronic Signature(s) Signed: 05/12/2023 12:21:04 PM By: Karie Schwalbe RN Entered By: Karie Schwalbe on 05/12/2023 08:00:58 -------------------------------------------------------------------------------- Vitals Details Patient Name: Date of Service: [name] 05/12/2023 10:30 A M Medical Record Number: [id number] Patient Account Number: [id number] Date of Birth/Sex: Treating RN: 1955/12/22 (67 y.o. F) Primary Care Lynna Zamorano: Cranford Mon Other Clinician: Referring Kayla Deshaies: Treating Rhayne Chatwin/Extender: Cassie Freer in Treatment: 1 Vital Signs Time Taken: 10:41 Temperature (F): 97.5 Height (in): 66 Pulse (bpm): 92 Weight (lbs): 158 Respiratory Rate  (breaths/min): 18 Body Mass Index (BMI): 25.5 Blood Pressure (mmHg): 125/82 Reference Range: 80 - 120 mg / dl Electronic Signature(s) Signed: 05/12/2023 11:24:29 AM By: Dayton Scrape Entered By: Dayton Scrape on 05/12/2023 07:42:04  Simla, [name] ([id number]) [id number] Page 1 of 7 Visit Report for 05/12/2023 Arrival Information Details Patient Name: Date of Service: [name] 05/12/2023 10:30 A M Medical Record Number: [id number] Patient Account Number: [id number] Date of Birth/Sex: Treating RN: September 22, 1955 (67 y.o. F) Primary Care Gracilyn Gunia: Cranford Mon Other Clinician: Referring Nayvie Lips: Treating Katora Fini/Extender: Cassie Freer in Treatment: 1 Visit Information History Since Last Visit Added or deleted any medications: No Patient Arrived: Ambulatory Any new allergies or adverse reactions: No Arrival Time: 10:41 Had a fall or experienced change in No Accompanied By: husband activities of daily living that may affect Transfer Assistance: None risk of falls: Patient Identification Verified: Yes Signs or symptoms of abuse/neglect since last visito No Secondary Verification Process Completed: Yes Hospitalized since last visit: No Implantable device outside of the clinic excluding No cellular tissue based products placed in the center since last visit: Pain Present Now: No Electronic Signature(s) Signed: 05/12/2023 11:24:29 AM By: Dayton Scrape Entered By: Dayton Scrape on 05/12/2023 07:41:44 -------------------------------------------------------------------------------- Encounter Discharge Information Details Patient Name: Date of Service: [name] 05/12/2023 10:30 A M Medical Record Number: [id number] Patient Account Number: [id number] Date of Birth/Sex: Treating RN: March 09, 1956 (67 y.o. [name] Primary Care Edrie Ehrich: Cranford Mon Other Clinician: Referring Patrice Moates: Treating Laverta Harnisch/Extender: Cassie Freer in Treatment: 1 Encounter Discharge Information Items Post Procedure Vitals Discharge Condition: Stable Temperature (F): 97.5 Ambulatory Status: Ambulatory Pulse (bpm): 92 Discharge Destination:  Home Respiratory Rate (breaths/min): 18 Transportation: Private Auto Blood Pressure (mmHg): 125/82 Accompanied By: spouse Schedule Follow-up Appointment: No Clinical Summary of Care: Patient Declined Electronic Signature(s) Signed: 05/12/2023 12:21:04 PM By: Karie Schwalbe RN Entered By: Karie Schwalbe on 05/12/2023 08:45:59 [name] ([id number]) [id number] Page 2 of 7 -------------------------------------------------------------------------------- Lower Extremity Assessment Details Patient Name: Date of Service: KRISHAUNA, RAMON 05/12/2023 10:30 A M Medical Record Number: [id number] Patient Account Number: [id number] Date of Birth/Sex: Treating RN: 1956/05/31 (67 y.o. [name] Primary Care Morgyn Marut: Cranford Mon Other Clinician: Referring Khai Arrona: Treating Drenda Sobecki/Extender: Eloy End Weeks in Treatment: 1 Edema Assessment Assessed: [Left: No] [Right: No] [Left: Edema] [Right: :] Calf Left: Right: Point of Measurement: From Medial Instep 36.4 cm Ankle Left: Right: Point of Measurement: From Medial Instep 22.1 cm Vascular Assessment Pulses: Dorsalis Pedis Palpable: [Right:Yes] Extremity colors, hair growth, and conditions: Extremity Color: [Right:Normal] Hair Growth on Extremity: [Right:Yes] Temperature of Extremity: [Right:Warm] Capillary Refill: [Right:< 3 seconds] Dependent Rubor: [Right:No No] Electronic Signature(s) Signed: 05/12/2023 12:21:04 PM By: Karie Schwalbe RN Entered By: Karie Schwalbe on 05/12/2023 07:54:09 -------------------------------------------------------------------------------- Multi Wound Chart Details Patient Name: Date of Service: [name] 05/12/2023 10:30 A M Medical Record Number: [id number] Patient Account Number: [id number] Date of Birth/Sex: Treating RN: 1955/08/09 (67 y.o. F) Primary Care Chamya Hunton: Cranford Mon Other Clinician: Referring  Mohamud Mrozek: Treating Hyatt Capobianco/Extender: Cassie Freer in Treatment: 1 Vital Signs Height(in): 66 Pulse(bpm): 92 Weight(lbs): 158 Blood Pressure(mmHg): 125/82 Body Mass Index(BMI): 25.5 Temperature(F): 97.5 Respiratory Rate(breaths/min): 18 [1:Photos:] [N/A:N/A] Right T Great oe N/A N/A Wound Location: Gradually Appeared N/A N/A Wounding Event: Atypical N/A N/A Primary Etiology: Hypertension, Rheumatoid Arthritis N/A N/A Comorbid History: 12/07/2022 N/A N/A Date Acquired: 1 N/A N/A Weeks of Treatment: Open N/A N/A Wound Status: No N/A N/A Wound Recurrence: 0.3x0.3x0.2 N/A N/A Measurements L x W x D (cm) 0.071 N/A N/A A (cm) : rea 0.014 N/A N/A Volume (cm) : 0.00% N/A N/A % Reduction in A rea: 0.00% N/A  Simla, [name] ([id number]) [id number] Page 1 of 7 Visit Report for 05/12/2023 Arrival Information Details Patient Name: Date of Service: [name] 05/12/2023 10:30 A M Medical Record Number: [id number] Patient Account Number: [id number] Date of Birth/Sex: Treating RN: September 22, 1955 (67 y.o. F) Primary Care Gracilyn Gunia: Cranford Mon Other Clinician: Referring Nayvie Lips: Treating Katora Fini/Extender: Cassie Freer in Treatment: 1 Visit Information History Since Last Visit Added or deleted any medications: No Patient Arrived: Ambulatory Any new allergies or adverse reactions: No Arrival Time: 10:41 Had a fall or experienced change in No Accompanied By: husband activities of daily living that may affect Transfer Assistance: None risk of falls: Patient Identification Verified: Yes Signs or symptoms of abuse/neglect since last visito No Secondary Verification Process Completed: Yes Hospitalized since last visit: No Implantable device outside of the clinic excluding No cellular tissue based products placed in the center since last visit: Pain Present Now: No Electronic Signature(s) Signed: 05/12/2023 11:24:29 AM By: Dayton Scrape Entered By: Dayton Scrape on 05/12/2023 07:41:44 -------------------------------------------------------------------------------- Encounter Discharge Information Details Patient Name: Date of Service: [name] 05/12/2023 10:30 A M Medical Record Number: [id number] Patient Account Number: [id number] Date of Birth/Sex: Treating RN: March 09, 1956 (67 y.o. [name] Primary Care Edrie Ehrich: Cranford Mon Other Clinician: Referring Patrice Moates: Treating Laverta Harnisch/Extender: Cassie Freer in Treatment: 1 Encounter Discharge Information Items Post Procedure Vitals Discharge Condition: Stable Temperature (F): 97.5 Ambulatory Status: Ambulatory Pulse (bpm): 92 Discharge Destination:  Home Respiratory Rate (breaths/min): 18 Transportation: Private Auto Blood Pressure (mmHg): 125/82 Accompanied By: spouse Schedule Follow-up Appointment: No Clinical Summary of Care: Patient Declined Electronic Signature(s) Signed: 05/12/2023 12:21:04 PM By: Karie Schwalbe RN Entered By: Karie Schwalbe on 05/12/2023 08:45:59 [name] ([id number]) [id number] Page 2 of 7 -------------------------------------------------------------------------------- Lower Extremity Assessment Details Patient Name: Date of Service: KRISHAUNA, RAMON 05/12/2023 10:30 A M Medical Record Number: [id number] Patient Account Number: [id number] Date of Birth/Sex: Treating RN: 1956/05/31 (67 y.o. [name] Primary Care Morgyn Marut: Cranford Mon Other Clinician: Referring Khai Arrona: Treating Drenda Sobecki/Extender: Eloy End Weeks in Treatment: 1 Edema Assessment Assessed: [Left: No] [Right: No] [Left: Edema] [Right: :] Calf Left: Right: Point of Measurement: From Medial Instep 36.4 cm Ankle Left: Right: Point of Measurement: From Medial Instep 22.1 cm Vascular Assessment Pulses: Dorsalis Pedis Palpable: [Right:Yes] Extremity colors, hair growth, and conditions: Extremity Color: [Right:Normal] Hair Growth on Extremity: [Right:Yes] Temperature of Extremity: [Right:Warm] Capillary Refill: [Right:< 3 seconds] Dependent Rubor: [Right:No No] Electronic Signature(s) Signed: 05/12/2023 12:21:04 PM By: Karie Schwalbe RN Entered By: Karie Schwalbe on 05/12/2023 07:54:09 -------------------------------------------------------------------------------- Multi Wound Chart Details Patient Name: Date of Service: [name] 05/12/2023 10:30 A M Medical Record Number: [id number] Patient Account Number: [id number] Date of Birth/Sex: Treating RN: 1955/08/09 (67 y.o. F) Primary Care Chamya Hunton: Cranford Mon Other Clinician: Referring  Mohamud Mrozek: Treating Hyatt Capobianco/Extender: Cassie Freer in Treatment: 1 Vital Signs Height(in): 66 Pulse(bpm): 92 Weight(lbs): 158 Blood Pressure(mmHg): 125/82 Body Mass Index(BMI): 25.5 Temperature(F): 97.5 Respiratory Rate(breaths/min): 18 [1:Photos:] [N/A:N/A] Right T Great oe N/A N/A Wound Location: Gradually Appeared N/A N/A Wounding Event: Atypical N/A N/A Primary Etiology: Hypertension, Rheumatoid Arthritis N/A N/A Comorbid History: 12/07/2022 N/A N/A Date Acquired: 1 N/A N/A Weeks of Treatment: Open N/A N/A Wound Status: No N/A N/A Wound Recurrence: 0.3x0.3x0.2 N/A N/A Measurements L x W x D (cm) 0.071 N/A N/A A (cm) : rea 0.014 N/A N/A Volume (cm) : 0.00% N/A N/A % Reduction in A rea: 0.00% N/A

## 2023-05-12 NOTE — Progress Notes (Signed)
10:30 A M Medical Record Number: [id number] Patient Account Number: [id number] Date of Birth/Sex: Treating RN: 23-May-1956 (67 y.o. F) Primary Care Provider: Cranford Mon Other Clinician: Referring Provider: Treating Provider/Extender: Cassie Freer in Treatment: 1 Information Obtained From Patient Chart Cardiovascular Medical History: Positive for: Hypertension [name], [name] ([id number]) [id number] Page 7 of 8 Gastrointestinal Medical History: Past Medical History Notes: colon polyps Endocrine Medical History: Negative for: Type II Diabetes Integumentary (Skin) Medical  History: Past Medical History Notes: viral warts Musculoskeletal Medical History: Positive for: Rheumatoid Arthritis Immunizations Pneumococcal Vaccine: Received Pneumococcal Vaccination: Yes Received Pneumococcal Vaccination On or After 60th Birthday: Yes Implantable Devices None Hospitalization / Surgery History Type of Hospitalization/Surgery Colonoscopy Cesarean section Tubal ligation Family and Social History Cancer: No; Diabetes: Yes - Mother; Heart Disease: Yes - Father; Hereditary Spherocytosis: No; Hypertension: Yes - Father; Kidney Disease: Yes - Father; Lung Disease: No; Seizures: No; Stroke: No; Thyroid Problems: Yes - Father; Tuberculosis: No; Current every day smoker - 1 ppd; Marital Status - Married; Alcohol Use: Never; Drug Use: No History; Caffeine Use: Daily; Financial Concerns: No; Food, Clothing or Shelter Needs: No; Support System Lacking: No; Transportation Concerns: No Electronic Signature(s) Signed: 05/12/2023 12:31:34 PM By: Duanne Guess MD FACS Entered By: Duanne Guess on 05/12/2023 08:26:44 -------------------------------------------------------------------------------- SuperBill Details Patient Name: Date of Service: [name], [name] 05/12/2023 Medical Record Number: [id number] Patient Account Number: [id number] Date of Birth/Sex: Treating RN: November 03, 1955 (67 y.o. F) Primary Care Provider: Cranford Mon Other Clinician: Referring Provider: Treating Provider/Extender: Cassie Freer in Treatment: 1 Diagnosis Coding ICD-10 Codes Code Description [phone] Non-pressure chronic ulcer of other part of right foot with fat layer exposed Z79.69 Long term (current) use of other immunomodulators and immunosuppressants Z72.0 Tobacco use Facility Procedures [name], [name] ([id number]): CPT4 Code Description [id number] [phone] - DEBRIDE WOUND 1ST 20 SQ CM OR < ICD-10 Diagnosis Description L97.512 Non-pressure chronic ulcer of other  part of right foot with fat la [phone].pdf Page 8 of 8: Modifier Quantity 1 yer exposed Physician Procedures : CPT4 Code Description Modifier [id number] - WC PHYS LEVEL 3 - EST PT 25 ICD-10 Diagnosis Description L97.512 Non-pressure chronic ulcer of other part of right foot with fat layer exposed Z79.69 Long term (current) use of other immunomodulators and  immunosuppressants Z72.0 Tobacco use Quantity: 1 : [phone] [id number] - WC PHYS DEBR WO ANESTH 20 SQ CM ICD-10 Diagnosis Description L97.512 Non-pressure chronic ulcer of other part of right foot with fat layer exposed Quantity: 1 Electronic Signature(s) Signed: 05/12/2023 11:29:24 AM By: Duanne Guess MD FACS Entered By: Duanne Guess on 05/12/2023 69:48:54  10:30 A M Medical Record Number: [id number] Patient Account Number: [id number] Date of Birth/Sex: Treating RN: 23-May-1956 (67 y.o. F) Primary Care Provider: Cranford Mon Other Clinician: Referring Provider: Treating Provider/Extender: Cassie Freer in Treatment: 1 Information Obtained From Patient Chart Cardiovascular Medical History: Positive for: Hypertension [name], [name] ([id number]) [id number] Page 7 of 8 Gastrointestinal Medical History: Past Medical History Notes: colon polyps Endocrine Medical History: Negative for: Type II Diabetes Integumentary (Skin) Medical  History: Past Medical History Notes: viral warts Musculoskeletal Medical History: Positive for: Rheumatoid Arthritis Immunizations Pneumococcal Vaccine: Received Pneumococcal Vaccination: Yes Received Pneumococcal Vaccination On or After 60th Birthday: Yes Implantable Devices None Hospitalization / Surgery History Type of Hospitalization/Surgery Colonoscopy Cesarean section Tubal ligation Family and Social History Cancer: No; Diabetes: Yes - Mother; Heart Disease: Yes - Father; Hereditary Spherocytosis: No; Hypertension: Yes - Father; Kidney Disease: Yes - Father; Lung Disease: No; Seizures: No; Stroke: No; Thyroid Problems: Yes - Father; Tuberculosis: No; Current every day smoker - 1 ppd; Marital Status - Married; Alcohol Use: Never; Drug Use: No History; Caffeine Use: Daily; Financial Concerns: No; Food, Clothing or Shelter Needs: No; Support System Lacking: No; Transportation Concerns: No Electronic Signature(s) Signed: 05/12/2023 12:31:34 PM By: Duanne Guess MD FACS Entered By: Duanne Guess on 05/12/2023 08:26:44 -------------------------------------------------------------------------------- SuperBill Details Patient Name: Date of Service: [name], [name] 05/12/2023 Medical Record Number: [id number] Patient Account Number: [id number] Date of Birth/Sex: Treating RN: November 03, 1955 (67 y.o. F) Primary Care Provider: Cranford Mon Other Clinician: Referring Provider: Treating Provider/Extender: Cassie Freer in Treatment: 1 Diagnosis Coding ICD-10 Codes Code Description [phone] Non-pressure chronic ulcer of other part of right foot with fat layer exposed Z79.69 Long term (current) use of other immunomodulators and immunosuppressants Z72.0 Tobacco use Facility Procedures [name], [name] ([id number]): CPT4 Code Description [id number] [phone] - DEBRIDE WOUND 1ST 20 SQ CM OR < ICD-10 Diagnosis Description L97.512 Non-pressure chronic ulcer of other  part of right foot with fat la [phone].pdf Page 8 of 8: Modifier Quantity 1 yer exposed Physician Procedures : CPT4 Code Description Modifier [id number] - WC PHYS LEVEL 3 - EST PT 25 ICD-10 Diagnosis Description L97.512 Non-pressure chronic ulcer of other part of right foot with fat layer exposed Z79.69 Long term (current) use of other immunomodulators and  immunosuppressants Z72.0 Tobacco use Quantity: 1 : [id number] - WC PHYS DEBR WO ANESTH 20 SQ CM ICD-10 Diagnosis Description L97.512 Non-pressure chronic ulcer of other part of right foot with fat layer exposed Quantity: 1 Electronic Signature(s) Signed: 05/12/2023 11:29:24 AM By: Duanne Guess MD FACS Entered By: Duanne Guess on 05/12/2023 69:48:54  Duanne Guess MD FACS Entered By: Duanne Guess on 05/12/2023 16:10:96 -------------------------------------------------------------------------------- Physical Exam Details Patient Name: Date of Service: [name], [name] 05/12/2023 10:30 A M Medical Record Number: [id number] Patient Account Number: [id number] Date of Birth/Sex: Treating RN: 1955/09/06 (67 y.o. F) Primary Care Provider: Cranford Mon Other Clinician: Referring Provider: Treating Provider/Extender: Eloy End Weeks in Treatment: 1 Constitutional . . . . no acute distress. Respiratory Normal work of breathing on room air. Notes 05/12/2023: No significant change to the wound. There is a fair amount of callus accumulation around it and undermining that is created by her skin. There is slough on the surface. [place], [name] ([id number]) [id number] Page 3 of 8 Electronic Signature(s) Signed: 05/12/2023 11:27:54 AM By: Duanne Guess MD FACS Entered By: Duanne Guess on 05/12/2023 08:27:54 -------------------------------------------------------------------------------- Physician Orders Details Patient Name: Date of Service: [name], [name] 05/12/2023 10:30 A M Medical Record Number: [id number] Patient Account Number: [id number] Date of Birth/Sex: Treating RN: [birth year] (67 y.o. Katrinka Blazing Primary Care Provider: Cranford Mon Other  Clinician: Referring Provider: Treating Provider/Extender: Cassie Freer in Treatment: 1 Verbal / Phone Orders: No Diagnosis Coding ICD-10 Coding Code Description [phone] Non-pressure chronic ulcer of other part of right foot with fat layer exposed Z79.69 Long term (current) use of other immunomodulators and immunosuppressants Z72.0 Tobacco use Follow-up Appointments ppointment in 1 week. - Dr. Lady Gary - room 2 05/19/23 at 10:30am Return A ppointment in 2 weeks. - Dr. Lady Gary 05/24/23 at 1:45pm (room 3) Return A Anesthetic (In clinic) Topical Lidocaine 5% applied to wound bed Bathing/ Shower/ Hygiene May shower and wash wound with soap and water. Off-Loading Other: - forefoot offloading shoe to right foot Additional Orders / Instructions Stop/Decrease Smoking Follow Nutritious Diet - 70-100 grams of protein a day Wound Treatment Wound #1 - T Great oe Wound Laterality: Right Cleanser: Soap and Water 1 x Per Day/30 Days Discharge Instructions: May shower and wash wound with dial antibacterial soap and water prior to dressing change. Cleanser: Wound Cleanser 1 x Per Day/30 Days Discharge Instructions: Cleanse the wound with wound cleanser prior to applying a clean dressing using gauze sponges, not tissue or cotton balls. Prim Dressing: Promogran Prisma Matrix, 4.34 (sq in) (silver collagen) 1 x Per Day/30 Days ary Discharge Instructions: Moisten collagen with saline or hydrogel Prim Dressing: Optifoam Non-Adhesive Dressing, 4x4 in 1 x Per Day/30 Days ary Discharge Instructions: Cut out a "donut " shape to cushion Secondary Dressing: Woven Gauze Sponges 2x2 in 1 x Per Day/30 Days Discharge Instructions: Apply over primary dressing as directed. Secured With: Insurance underwriter, Sterile 2x75 (in/in) 1 x Per Day/30 Days Discharge Instructions: Secure with stretch gauze as directed. Secured With: Paper Tape, 2x10 (in/yd) 1 x Per Day/30  Days Discharge Instructions: Secure dressing with tape as directed. Electronic Signature(s) Signed: 05/12/2023 12:31:34 PM By: Duanne Guess MD FACS Entered By: Duanne Guess on 05/12/2023 08:28:13 [name] ([id number]) [id number] Page 4 of 8 -------------------------------------------------------------------------------- Problem List Details Patient Name: Date of Service: [name], [name] 05/12/2023 10:30 A M Medical Record Number: [id number] Patient Account Number: [id number] Date of Birth/Sex: Treating RN: 02-15-1956 (67 y.o. F) Primary Care Provider: Cranford Mon Other Clinician: Referring Provider: Treating Provider/Extender: Cassie Freer in Treatment: 1 Active Problems ICD-10 Encounter Code Description Active Date MDM Diagnosis L97.512 Non-pressure chronic ulcer of other part of right foot with fat layer exposed 05/02/2023 No Yes Z79.69 Long term (current) use of other immunomodulators  10:30 A M Medical Record Number: [id number] Patient Account Number: [id number] Date of Birth/Sex: Treating RN: 23-May-1956 (67 y.o. F) Primary Care Provider: Cranford Mon Other Clinician: Referring Provider: Treating Provider/Extender: Cassie Freer in Treatment: 1 Information Obtained From Patient Chart Cardiovascular Medical History: Positive for: Hypertension [name], [name] ([id number]) [id number] Page 7 of 8 Gastrointestinal Medical History: Past Medical History Notes: colon polyps Endocrine Medical History: Negative for: Type II Diabetes Integumentary (Skin) Medical  History: Past Medical History Notes: viral warts Musculoskeletal Medical History: Positive for: Rheumatoid Arthritis Immunizations Pneumococcal Vaccine: Received Pneumococcal Vaccination: Yes Received Pneumococcal Vaccination On or After 60th Birthday: Yes Implantable Devices None Hospitalization / Surgery History Type of Hospitalization/Surgery Colonoscopy Cesarean section Tubal ligation Family and Social History Cancer: No; Diabetes: Yes - Mother; Heart Disease: Yes - Father; Hereditary Spherocytosis: No; Hypertension: Yes - Father; Kidney Disease: Yes - Father; Lung Disease: No; Seizures: No; Stroke: No; Thyroid Problems: Yes - Father; Tuberculosis: No; Current every day smoker - 1 ppd; Marital Status - Married; Alcohol Use: Never; Drug Use: No History; Caffeine Use: Daily; Financial Concerns: No; Food, Clothing or Shelter Needs: No; Support System Lacking: No; Transportation Concerns: No Electronic Signature(s) Signed: 05/12/2023 12:31:34 PM By: Duanne Guess MD FACS Entered By: Duanne Guess on 05/12/2023 08:26:44 -------------------------------------------------------------------------------- SuperBill Details Patient Name: Date of Service: [name], [name] 05/12/2023 Medical Record Number: [id number] Patient Account Number: [id number] Date of Birth/Sex: Treating RN: November 03, 1955 (67 y.o. F) Primary Care Provider: Cranford Mon Other Clinician: Referring Provider: Treating Provider/Extender: Cassie Freer in Treatment: 1 Diagnosis Coding ICD-10 Codes Code Description [phone] Non-pressure chronic ulcer of other part of right foot with fat layer exposed Z79.69 Long term (current) use of other immunomodulators and immunosuppressants Z72.0 Tobacco use Facility Procedures [name], [name] ([id number]): CPT4 Code Description [id number] [phone] - DEBRIDE WOUND 1ST 20 SQ CM OR < ICD-10 Diagnosis Description L97.512 Non-pressure chronic ulcer of other  part of right foot with fat la [phone].pdf Page 8 of 8: Modifier Quantity 1 yer exposed Physician Procedures : CPT4 Code Description Modifier [id number] - WC PHYS LEVEL 3 - EST PT 25 ICD-10 Diagnosis Description L97.512 Non-pressure chronic ulcer of other part of right foot with fat layer exposed Z79.69 Long term (current) use of other immunomodulators and  immunosuppressants Z72.0 Tobacco use Quantity: 1 : [id number] - WC PHYS DEBR WO ANESTH 20 SQ CM ICD-10 Diagnosis Description L97.512 Non-pressure chronic ulcer of other part of right foot with fat layer exposed Quantity: 1 Electronic Signature(s) Signed: 05/12/2023 11:29:24 AM By: Duanne Guess MD FACS Entered By: Duanne Guess on 05/12/2023 69:48:54  [place], [name] ([id number]) [id number] Page 1 of 8 Visit Report for 05/12/2023 Chief Complaint Document Details Patient Name: Date of Service: JANEEKA, [name] 05/12/2023 10:30 A M Medical Record Number: [id number] Patient Account Number: [id number] Date of Birth/Sex: Treating RN: 1956/02/01 (67 y.o. F) Primary Care Provider: Cranford Mon Other Clinician: Referring Provider: Treating Provider/Extender: Cassie Freer in Treatment: 1 Information Obtained from: Patient Chief Complaint Patient seen for complaints of Non-Healing Wound. Electronic Signature(s) Signed: 05/12/2023 11:15:30 AM By: Duanne Guess MD FACS Entered By: Duanne Guess on 05/12/2023 08:15:29 -------------------------------------------------------------------------------- Debridement Details Patient Name: Date of Service: [name], [name] 05/12/2023 10:30 A M Medical Record Number: [id number] Patient Account Number: [id number] Date of Birth/Sex: Treating RN: [birth year] (67 y.o. Katrinka Blazing Primary Care Provider: Cranford Mon Other Clinician: Referring Provider: Treating Provider/Extender: Cassie Freer in Treatment: 1 Debridement Performed for Assessment: Wound #1 Right T Great oe Performed By: Physician Duanne Guess, MD The following information was scribed by: Karie Schwalbe The information was scribed for: Duanne Guess Debridement Type: Debridement Level of Consciousness (Pre-procedure): Awake and Alert Pre-procedure Verification/Time Out Yes - 10:55 Taken: Start Time: 10:55 Pain Control: Lidocaine 4% Topical Solution Percent of Wound Bed Debrided: 100% T Area Debrided (cm): otal 0.07 Tissue and other material debrided: Non-Viable, Callus, Slough, Skin: Dermis , Skin: Epidermis, Slough Level: Skin/Epidermis Debridement Description: Selective/Open Wound Instrument: Curette Bleeding: Minimum Hemostasis  Achieved: Pressure End Time: 10:58 Procedural Pain: 0 Post Procedural Pain: 0 Response to Treatment: Procedure was tolerated well Level of Consciousness (Post- Awake and Alert procedure): Post Debridement Measurements of Total Wound Length: (cm) 0.3 Width: (cm) 0.3 Depth: (cm) 0.2 Volume: (cm) 0.014 [name], [name] ([id number]) [id number] Page 2 of 8 Character of Wound/Ulcer Post Debridement: Improved Post Procedure Diagnosis Same as Pre-procedure Electronic Signature(s) Signed: 05/12/2023 12:21:04 PM By: Karie Schwalbe RN Signed: 05/12/2023 12:31:34 PM By: Duanne Guess MD FACS Entered By: Karie Schwalbe on 05/12/2023 08:02:47 -------------------------------------------------------------------------------- HPI Details Patient Name: Date of Service: [name], [name] 05/12/2023 10:30 A M Medical Record Number: [id number] Patient Account Number: [id number] Date of Birth/Sex: Treating RN: April 11, 1956 (67 y.o. F) Primary Care Provider: Cranford Mon Other Clinician: Referring Provider: Treating Provider/Extender: Cassie Freer in Treatment: 1 History of Present Illness HPI Description: ADMISSION ***ABI R: 1.15*** This is a 67 year old woman with a longstanding history of tobacco abuse, smoking a pack per day since the age of 63. She also has rheumatoid arthritis for which she receives immunomodulators. She was referred by podiatry for further evaluation and management of a right great toe ulcer. In May of this year, she had a callus debridement that revealed an open wound underlying the callus. It has apparently been managed with Betadine dressing changes and a surgical shoe for offloading. There was some question that she had peripheral neuropathy, but she underwent EMG testing that was negative for neuropathy and radiculopathy. The neurologist suggested that she undergo vascular studies, but I do not see that these have been  performed. Her most recent visit with podiatry was on September 30. The wound still had not healed as a result, she was referred to the wound care center for further evaluation and treatment. 05/12/2023: No significant change to the wound. There is a fair amount of callus accumulation around it and undermining that is created by her skin. There is slough on the surface. Electronic Signature(s) Signed: 05/12/2023 11:26:12 AM By: Duanne Guess MD FACS Previous Signature: 05/12/2023 11:16:00 AM Version By:

## 2023-05-18 DIAGNOSIS — H524 Presbyopia: Secondary | ICD-10-CM | POA: Diagnosis not present

## 2023-05-18 DIAGNOSIS — H02831 Dermatochalasis of right upper eyelid: Secondary | ICD-10-CM | POA: Diagnosis not present

## 2023-05-18 DIAGNOSIS — H02834 Dermatochalasis of left upper eyelid: Secondary | ICD-10-CM | POA: Diagnosis not present

## 2023-05-18 DIAGNOSIS — H2513 Age-related nuclear cataract, bilateral: Secondary | ICD-10-CM | POA: Diagnosis not present

## 2023-05-18 DIAGNOSIS — H5203 Hypermetropia, bilateral: Secondary | ICD-10-CM | POA: Diagnosis not present

## 2023-05-19 ENCOUNTER — Encounter (HOSPITAL_BASED_OUTPATIENT_CLINIC_OR_DEPARTMENT_OTHER): Payer: Medicare Other | Admitting: General Surgery

## 2023-05-19 DIAGNOSIS — F17210 Nicotine dependence, cigarettes, uncomplicated: Secondary | ICD-10-CM | POA: Diagnosis not present

## 2023-05-19 DIAGNOSIS — M069 Rheumatoid arthritis, unspecified: Secondary | ICD-10-CM | POA: Diagnosis not present

## 2023-05-19 DIAGNOSIS — Z860100 Personal history of colon polyps, unspecified: Secondary | ICD-10-CM | POA: Diagnosis not present

## 2023-05-19 DIAGNOSIS — Z7969 Long term (current) use of other immunomodulators and immunosuppressants: Secondary | ICD-10-CM | POA: Diagnosis not present

## 2023-05-19 DIAGNOSIS — S91101A Unspecified open wound of right great toe without damage to nail, initial encounter: Secondary | ICD-10-CM | POA: Diagnosis not present

## 2023-05-19 DIAGNOSIS — I10 Essential (primary) hypertension: Secondary | ICD-10-CM | POA: Diagnosis not present

## 2023-05-19 DIAGNOSIS — L97512 Non-pressure chronic ulcer of other part of right foot with fat layer exposed: Secondary | ICD-10-CM | POA: Diagnosis not present

## 2023-05-19 NOTE — Progress Notes (Addendum)
Skin care regimen initiated : 05/02/2023 Topical wound management initiated : 05/02/2023 Notes: Electronic Signature(s) Signed: 05/19/2023 11:55:59 AM By: [name] Entered By: [name] on 05/19/2023 08:13:15 -------------------------------------------------------------------------------- Pain Assessment Details Patient Name: Date of Service: [name], [name] 05/19/2023 10:30 A M Medical Record Number: [id number] Patient Account  Number: [id number] Date of Birth/Sex: Treating RN: 08/19/1955 (67 y.o. F) Primary Care [name]: [name] Other Clinician: Referring [name]: Treating [name]/Extender: [name] in Treatment: 2 Active Problems Location of Pain Severity and Description of Pain Patient Has Paino No Site Locations Pain Management and Medication Current Pain Management: Electronic Signature(s) Signed: 05/19/2023 10:42:43 AM By: [name] Entered By: [name] on 05/19/2023 07:35:52 -------------------------------------------------------------------------------- Patient/Caregiver Education Details Patient Name: Date of Service: [name] 10/25/2024andnbsp10:30 A M Medical Record Number: [id number] Patient Account Number: [id number] Date of Birth/Gender: Treating RN: 1955-09-05 (67 y.o. [name] Primary Care Physician: [name] Other Clinician: [name] ([id number]) [id number] Page 6 of 8 Referring Physician: Treating Physician/Extender: [name] in Treatment: 2 Education Assessment Education Provided To: Patient Education Topics Provided Wound/Skin Impairment: Methods: Explain/Verbal Responses: Reinforcements needed, State content correctly Electronic Signature(s) Signed: 05/19/2023 11:55:59 AM By: [name] Entered By: [name] on 05/19/2023 08:14:40 -------------------------------------------------------------------------------- Wound Assessment Details Patient Name: Date of Service: [name], [name] 05/19/2023 10:30 A M Medical Record Number: [id number] Patient Account Number: [id number] Date of Birth/Sex: Treating RN: 1956/02/01 (67 y.o. F) Primary Care [name]: [name] Other Clinician: Referring [name]: Treating [name]/Extender: [name] in Treatment: 2 Wound Status Wound Number: 1 Primary  Etiology: Atypical Wound Location: Right T Great oe Wound Status: Open Wounding Event: Gradually Appeared Comorbid History: Hypertension, Rheumatoid Arthritis Date Acquired: 12/07/2022 Weeks Of Treatment: 2 Clustered Wound: No Photos Wound Measurements Length: (cm) 0.3 Width: (cm) 0.3 Depth: (cm) 0.2 Area: (cm) 0.071 Volume: (cm) 0.014 % Reduction in Area: 0% % Reduction in Volume: 0% Epithelialization: Small (1-33%) Tunneling: No Undermining: No Wound Description Classification: Full Thickness Without Exposed Suppor Wound Margin: Distinct, outline attached Exudate Amount: Medium Exudate Type: Serosanguineous Exudate Color: red, brown t Structures Foul Odor After Cleansing: No Slough/Fibrino Yes Wound Bed [place], [name] ([id number]) [id number] Page 7 of 8 Granulation Amount: Large (67-100%) Exposed Structure Granulation Quality: Red, Pink Fascia Exposed: No Necrotic Amount: Small (1-33%) Fat Layer (Subcutaneous Tissue) Exposed: Yes Necrotic Quality: Adherent Slough Tendon Exposed: No Muscle Exposed: No Joint Exposed: No Bone Exposed: No Periwound Skin Texture Texture Color No Abnormalities Noted: No No Abnormalities Noted: Yes Callus: Yes Temperature / Pain Temperature: No Abnormality Moisture No Abnormalities Noted: Yes Treatment Notes Wound #1 (Toe Great) Wound Laterality: Right Cleanser Soap and Water Discharge Instruction: May shower and wash wound with dial antibacterial soap and water prior to dressing change. Wound Cleanser Discharge Instruction: Cleanse the wound with wound cleanser prior to applying a clean dressing using gauze sponges, not tissue or cotton balls. Peri-Wound Care Topical Gentamicin Discharge Instruction: As directed by physician Mupirocin Ointment Discharge Instruction: Apply Mupirocin (Bactroban) as instructed Primary Dressing Promogran Prisma Matrix, 4.34 (sq in) (silver collagen) Discharge  Instruction: Moisten collagen with saline or hydrogel Optifoam Non-Adhesive Dressing, 4x4 in Discharge Instruction: Cut out a "donut " shape to cushion Secondary Dressing Woven Gauze Sponges 2x2 in Discharge Instruction: Apply over primary dressing as directed. Secured With Conforming Stretch Gauze Bandage, Sterile 2x75 (in/in) Discharge Instruction: Secure with stretch gauze as directed. Paper Tape, 2x10 (in/yd) Discharge Instruction: Secure dressing with tape  as directed. Compression Wrap Compression Stockings Add-Ons Electronic Signature(s) Signed: 05/19/2023 12:52:22 PM By: Tammy Schwalbe RN Previous Signature: 05/19/2023 10:42:43 AM Version By: [name] Entered By: [name] on 05/19/2023 08:05:52 -------------------------------------------------------------------------------- Vitals Details Patient Name: Date of Service: [name], [name] 05/19/2023 10:30 A M Medical Record Number: [id number] Patient Account Number: [id number] Date of Birth/Sex: Treating RN: 1956/04/29 (67 y.o. F) Primary Care Deaglan Lile: [name] Other Clinician: [name] ([id number]) [id number] Page 8 of 8 Referring [name]: Treating [name]/Extender: [name] in Treatment: 2 Vital Signs Time Taken: 10:34 Temperature (F): 97.5 Height (in): 66 Pulse (bpm): 93 Weight (lbs): 158 Respiratory Rate (breaths/min): 18 Body Mass Index (BMI): 25.5 Blood Pressure (mmHg): 130/81 Reference Range: 80 - 120 mg / dl Electronic Signature(s) Signed: 05/19/2023 10:42:43 AM By: [name] Entered By: [name] on 05/19/2023 07:35:46  Skin care regimen initiated : 05/02/2023 Topical wound management initiated : 05/02/2023 Notes: Electronic Signature(s) Signed: 05/19/2023 11:55:59 AM By: [name] Entered By: [name] on 05/19/2023 08:13:15 -------------------------------------------------------------------------------- Pain Assessment Details Patient Name: Date of Service: [name], [name] 05/19/2023 10:30 A M Medical Record Number: [id number] Patient Account  Number: [id number] Date of Birth/Sex: Treating RN: 08/19/1955 (67 y.o. F) Primary Care [name]: [name] Other Clinician: Referring [name]: Treating [name]/Extender: [name] in Treatment: 2 Active Problems Location of Pain Severity and Description of Pain Patient Has Paino No Site Locations Pain Management and Medication Current Pain Management: Electronic Signature(s) Signed: 05/19/2023 10:42:43 AM By: [name] Entered By: [name] on 05/19/2023 07:35:52 -------------------------------------------------------------------------------- Patient/Caregiver Education Details Patient Name: Date of Service: [name] 10/25/2024andnbsp10:30 A M Medical Record Number: [id number] Patient Account Number: [id number] Date of Birth/Gender: Treating RN: 1955-09-05 (67 y.o. [name] Primary Care Physician: [name] Other Clinician: [name] ([id number]) [id number] Page 6 of 8 Referring Physician: Treating Physician/Extender: [name] in Treatment: 2 Education Assessment Education Provided To: Patient Education Topics Provided Wound/Skin Impairment: Methods: Explain/Verbal Responses: Reinforcements needed, State content correctly Electronic Signature(s) Signed: 05/19/2023 11:55:59 AM By: [name] Entered By: [name] on 05/19/2023 08:14:40 -------------------------------------------------------------------------------- Wound Assessment Details Patient Name: Date of Service: [name], [name] 05/19/2023 10:30 A M Medical Record Number: [id number] Patient Account Number: [id number] Date of Birth/Sex: Treating RN: 1956/02/01 (67 y.o. F) Primary Care [name]: [name] Other Clinician: Referring [name]: Treating [name]/Extender: [name] in Treatment: 2 Wound Status Wound Number: 1 Primary  Etiology: Atypical Wound Location: Right T Great oe Wound Status: Open Wounding Event: Gradually Appeared Comorbid History: Hypertension, Rheumatoid Arthritis Date Acquired: 12/07/2022 Weeks Of Treatment: 2 Clustered Wound: No Photos Wound Measurements Length: (cm) 0.3 Width: (cm) 0.3 Depth: (cm) 0.2 Area: (cm) 0.071 Volume: (cm) 0.014 % Reduction in Area: 0% % Reduction in Volume: 0% Epithelialization: Small (1-33%) Tunneling: No Undermining: No Wound Description Classification: Full Thickness Without Exposed Suppor Wound Margin: Distinct, outline attached Exudate Amount: Medium Exudate Type: Serosanguineous Exudate Color: red, brown t Structures Foul Odor After Cleansing: No Slough/Fibrino Yes Wound Bed [place], [name] ([id number]) [id number] Page 7 of 8 Granulation Amount: Large (67-100%) Exposed Structure Granulation Quality: Red, Pink Fascia Exposed: No Necrotic Amount: Small (1-33%) Fat Layer (Subcutaneous Tissue) Exposed: Yes Necrotic Quality: Adherent Slough Tendon Exposed: No Muscle Exposed: No Joint Exposed: No Bone Exposed: No Periwound Skin Texture Texture Color No Abnormalities Noted: No No Abnormalities Noted: Yes Callus: Yes Temperature / Pain Temperature: No Abnormality Moisture No Abnormalities Noted: Yes Treatment Notes Wound #1 (Toe Great) Wound Laterality: Right Cleanser Soap and Water Discharge Instruction: May shower and wash wound with dial antibacterial soap and water prior to dressing change. Wound Cleanser Discharge Instruction: Cleanse the wound with wound cleanser prior to applying a clean dressing using gauze sponges, not tissue or cotton balls. Peri-Wound Care Topical Gentamicin Discharge Instruction: As directed by physician Mupirocin Ointment Discharge Instruction: Apply Mupirocin (Bactroban) as instructed Primary Dressing Promogran Prisma Matrix, 4.34 (sq in) (silver collagen) Discharge  Instruction: Moisten collagen with saline or hydrogel Optifoam Non-Adhesive Dressing, 4x4 in Discharge Instruction: Cut out a "donut " shape to cushion Secondary Dressing Woven Gauze Sponges 2x2 in Discharge Instruction: Apply over primary dressing as directed. Secured With Conforming Stretch Gauze Bandage, Sterile 2x75 (in/in) Discharge Instruction: Secure with stretch gauze as directed. Paper Tape, 2x10 (in/yd) Discharge Instruction: Secure dressing with tape  N/A % Reduction in Volume: Full Thickness Without Exposed N/A N/A Classification: Support Structures Medium N/A N/A Exudate A mount: Serosanguineous N/A N/A Exudate Type: red, brown N/A N/A Exudate Color: Distinct, outline attached N/A N/A Wound Margin: Large (67-100%) N/A N/A Granulation A mount: Red, Pink N/A N/A Granulation Quality: Small (1-33%) N/A N/A Necrotic A mount: Fat Layer (Subcutaneous Tissue): Yes N/A N/A Exposed Structures: Fascia: No Tendon: No Muscle: No Joint: No Bone: No Small (1-33%) N/A N/A Epithelialization: Debridement - Selective/Open Wound N/A N/A Debridement: Pre-procedure Verification/Time Out 11:14 N/A N/A Taken: Lidocaine 4% T opical Solution N/A N/A Pain Control: Callus, Slough N/A N/A Tissue Debrided: Non-Viable Tissue N/A N/A Level: 0.07 N/A N/A Debridement A (sq cm): rea Curette N/A N/A Instrument: Minimum N/A N/A Bleeding: Pressure N/A N/A Hemostasis A chieved: Procedure was tolerated well N/A N/A Debridement Treatment Response: 0.3x0.3x0.2 N/A N/A Post Debridement Measurements L x W x D (cm) 0.014 N/A N/A Post Debridement Volume: (cm) Callus: Yes N/A  N/A Periwound Skin Texture: No Abnormalities Noted N/A N/A Periwound Skin Moisture: No Abnormalities Noted N/A N/A Periwound Skin Color: No Abnormality N/A N/A Temperature: Debridement N/A N/A Procedures Performed: Treatment Notes Wound #1 (Toe Great) Wound Laterality: Right Cleanser Soap and Water Discharge Instruction: May shower and wash wound with dial antibacterial soap and water prior to dressing change. Wound Cleanser Discharge Instruction: Cleanse the wound with wound cleanser prior to applying a clean dressing using gauze sponges, not tissue or cotton balls. Peri-Wound Care Topical Gentamicin Discharge Instruction: As directed by physician Mupirocin Ointment Discharge Instruction: Apply Mupirocin (Bactroban) as instructed Primary Dressing Promogran Prisma Matrix, 4.34 (sq in) (silver collagen) Discharge Instruction: Moisten collagen with saline or hydrogel Optifoam Non-Adhesive Dressing, 4x4 in Discharge Instruction: Cut out a "donut " shape to cushion Secondary Dressing Woven Gauze Sponges 2x2 in [place], [name] ([id number]) [id number] Page 4 of 8 Discharge Instruction: Apply over primary dressing as directed. Secured With Conforming Stretch Gauze Bandage, Sterile 2x75 (in/in) Discharge Instruction: Secure with stretch gauze as directed. Paper Tape, 2x10 (in/yd) Discharge Instruction: Secure dressing with tape as directed. Compression Wrap Compression Stockings Add-Ons Electronic Signature(s) Signed: 05/19/2023 11:25:24 AM By: Duanne Guess MD FACS Entered By: Duanne Guess on 05/19/2023 08:25:23 -------------------------------------------------------------------------------- Multi-Disciplinary Care Plan Details Patient Name: Date of Service: [name], [name] 05/19/2023 10:30 A M Medical Record Number: [id number] Patient Account Number: [id number] Date of Birth/Sex: Treating RN: 21-Oct-1955 (67 y.o. [name] Primary Care Desman Polak: [name] Other Clinician: Referring Choua Chalker: Treating Natika Geyer/Extender: [name] in Treatment: 2 Active Inactive Orientation to the Wound Care Program Nursing Diagnoses: Knowledge deficit related to the wound healing center program Goals: Patient/caregiver will verbalize understanding of the Wound Healing Center Program Date Initiated: 05/02/2023 Target Resolution Date: 07/02/2023 Goal Status: Active Interventions: Provide education on orientation to the wound center Notes: Wound/Skin Impairment Nursing Diagnoses: Impaired tissue integrity Knowledge deficit related to smoking impact on wound healing Knowledge deficit related to ulceration/compromised skin integrity Goals: Patient will demonstrate a reduced rate of smoking or cessation of smoking Date Initiated: 05/02/2023 Target Resolution Date: 07/02/2023 Goal Status: Active Patient/caregiver will verbalize understanding of skin care regimen Date Initiated: 05/02/2023 Target Resolution Date: 07/02/2023 Goal Status: Active Interventions: Assess patient/caregiver ability to obtain necessary supplies Assess patient/caregiver ability to perform ulcer/skin care regimen upon admission and as needed Assess ulceration(s) every visit Provide education on smoking [place], [name] ([id number]) [id number] Page 5 of 8 Treatment Activities: Referred to DME [place] Krotz for dressing supplies : 05/02/2023

## 2023-05-19 NOTE — Progress Notes (Signed)
directed. Secured With: Insurance underwriter, Sterile 2x75 (in/in) 1 x Per Day/30 Days Discharge Instructions: Secure with stretch gauze as directed. Secured With: Paper T ape, 2x10 (in/yd) 1 x Per Day/30 Days Discharge Instructions: Secure dressing with tape as directed. 05/19/2023: There has been no change to her wound. There is still callus buildup around the edges and some slough on the surface. I used a curette to debride the callus and slough from her wound. I am going to add a mixture of topical gentamicin and mupirocin to the rash to see if suppression of normal skin flora helps this out and moving the wound forward. We  will continue silver collagen and forefoot offloading sandal. Follow-up in 1 week. Electronic Signature(s) Signed: 05/19/2023 11:52:48 AM By: Duanne Guess MD FACS Entered By: Duanne Guess on 05/19/2023 11:52:47 [name] ([id number]) [id number] Page 7 of 8 -------------------------------------------------------------------------------- HxROS Details Patient Name: Date of Service: [name], [name] 05/19/2023 10:30 A M Medical Record Number: [id number] Patient Account Number: [id number] Date of Birth/Sex: Treating RN: 1956/07/15 (67 y.o. F) Primary Care Provider: Cranford Mon Other Clinician: Referring Provider: Treating Provider/Extender: Cassie Freer in Treatment: 2 Information Obtained From Patient Chart Cardiovascular Medical History: Positive for: Hypertension Gastrointestinal Medical History: Past Medical History Notes: colon polyps Endocrine Medical History: Negative for: Type II Diabetes Integumentary (Skin) Medical History: Past Medical History Notes: viral warts Musculoskeletal Medical History: Positive for: Rheumatoid Arthritis Immunizations Pneumococcal Vaccine: Received Pneumococcal Vaccination: Yes Received Pneumococcal Vaccination On or After 60th Birthday: Yes Implantable Devices None Hospitalization / Surgery History Type of Hospitalization/Surgery Colonoscopy Cesarean section Tubal ligation Family and Social History Cancer: No; Diabetes: Yes - Mother; Heart Disease: Yes - Father; Hereditary Spherocytosis: No; Hypertension: Yes - Father; Kidney Disease: Yes - Father; Lung Disease: No; Seizures: No; Stroke: No; Thyroid Problems: Yes - Father; Tuberculosis: No; Current every day smoker - 1 ppd; Marital Status - Married; Alcohol Use: Never; Drug Use: No History; Caffeine Use: Daily; Financial Concerns: No; Food, Clothing or Shelter Needs: No; Support System Lacking: No; Transportation  Concerns: No Electronic Signature(s) Signed: 05/19/2023 12:25:45 PM By: Duanne Guess MD FACS Entered By: Duanne Guess on 05/19/2023 11:33:28 [name] ([id number]) [id number] Page 8 of 8 -------------------------------------------------------------------------------- SuperBill Details Patient Name: Date of Service: [name], [name] 05/19/2023 Medical Record Number: [id number] Patient Account Number: [id number] Date of Birth/Sex: Treating RN: 12/18/1955 (67 y.o. F) Primary Care Provider: Cranford Mon Other Clinician: Referring Provider: Treating Provider/Extender: Cassie Freer in Treatment: 2 Diagnosis Coding ICD-10 Codes Code Description [phone] Non-pressure chronic ulcer of other part of right foot with fat layer exposed Z79.69 Long term (current) use of other immunomodulators and immunosuppressants Z72.0 Tobacco use Facility Procedures : CPT4 Code: 20254270 Description: [phone] - DEBRIDE WOUND 1ST 20 SQ CM OR < ICD-10 Diagnosis Description L97.512 Non-pressure chronic ulcer of other part of right foot with fat layer exposed Modifier: Quantity: 1 Physician Procedures : CPT4 Code Description Modifier [id number] - WC PHYS LEVEL 4 - EST PT ICD-10 Diagnosis Description L97.512 Non-pressure chronic ulcer of other part of right foot with fat layer exposed Z79.69 Long term (current) use of other immunomodulators and  immunosuppressants Z72.0 Tobacco use Quantity: 1 : [id number] - WC PHYS DEBR WO ANESTH 20 SQ CM ICD-10 Diagnosis Description L97.512 Non-pressure chronic ulcer of other part of right foot with fat layer exposed Quantity: 1 Electronic Signature(s) Signed: 05/19/2023 11:53:07 AM By: Duanne Guess MD FACS Entered By: Duanne Guess on 05/19/2023  FACS Entered By: Duanne Guess on 05/19/2023 11:33:22 -------------------------------------------------------------------------------- Physical Exam Details Patient Name: Date of Service: [name], [name] 05/19/2023 10:30 A M Medical Record Number: [id number] Patient Account Number: [id number] Date of Birth/Sex: Treating RN: [birth year] (67 y.o. F) Primary Care Provider: Cranford Mon Other Clinician: Referring Provider: Treating Provider/Extender: Eloy End Weeks in Treatment: 2 Constitutional . . . . no acute distress. Respiratory Normal work of breathing on room air.. Notes 05/19/2023: There has been no change to her wound. There is still callus buildup around the edges and some slough on the surface. Electronic Signature(s) Signed: 05/19/2023 11:33:50 AM By: Duanne Guess MD FACS Entered By: Duanne Guess on 05/19/2023 11:33:50 [name] ([id number]) [id number] Page 3 of 8 -------------------------------------------------------------------------------- Physician Orders Details Patient Name: Date of Service: [name], [name] 05/19/2023 10:30 A M Medical Record Number: [id number] Patient Account Number: [id number] Date of Birth/Sex: Treating RN: 06-Sep-1955 (67 y.o. [name] Primary Care Provider: Cranford Mon Other Clinician: Referring Provider: Treating Provider/Extender: Cassie Freer in Treatment: 2 The following information was scribed by: Samuella Bruin The information was scribed for: Duanne Guess Verbal / Phone Orders: No Diagnosis Coding ICD-10 Coding Code Description [phone] Non-pressure chronic ulcer of other part of right foot with fat layer exposed Z79.69 Long term (current) use of other immunomodulators and immunosuppressants Z72.0 Tobacco use Follow-up Appointments ppointment in 1 week. - Dr. Lady Gary - room 2 Return A Anesthetic (In clinic) Topical Lidocaine 4% applied to wound bed Bathing/ Shower/ Hygiene May shower and wash wound with soap and water. Off-Loading Other: - forefoot offloading shoe to right foot Additional Orders / Instructions Stop/Decrease Smoking Follow Nutritious Diet - 70-100 grams of protein a day, 500 mg of vitamin C once a day and if tolerated then twice a day, zinc 30-50 mg a day Wound Treatment Wound #1 - T Great oe Wound Laterality: Right Cleanser: Soap and Water 1 x Per Day/30 Days Discharge Instructions: May shower and wash wound with dial antibacterial soap and water prior to dressing change. Cleanser: Wound Cleanser 1 x Per Day/30 Days Discharge Instructions: Cleanse the wound with wound cleanser prior to applying a clean dressing using gauze sponges, not tissue or cotton balls. Topical: Gentamicin 1 x Per Day/30 Days Discharge Instructions: As directed by physician Topical: Mupirocin Ointment 1 x Per Day/30 Days Discharge Instructions: Apply Mupirocin (Bactroban) as instructed Prim Dressing: Promogran Prisma Matrix, 4.34 (sq in) (silver collagen) 1 x Per Day/30 Days ary Discharge Instructions: Moisten collagen with saline or hydrogel Prim Dressing: Optifoam Non-Adhesive Dressing, 4x4 in 1 x Per Day/30 Days ary Discharge Instructions: Cut out a "donut " shape to cushion Secondary Dressing: Woven Gauze Sponges 2x2 in 1 x Per Day/30 Days Discharge Instructions: Apply over primary dressing as  directed. Secured With: Insurance underwriter, Sterile 2x75 (in/in) 1 x Per Day/30 Days Discharge Instructions: Secure with stretch gauze as directed. Secured With: Paper Tape, 2x10 (in/yd) 1 x Per Day/30 Days Discharge Instructions: Secure dressing with tape as directed. Patient Medications [name], [name] ([id number]) [id number] Page 4 of 8 llergies: abatacept, upadacitinib, etanercept, tocilizumab A Notifications Medication Indication Start End 05/19/2023 lidocaine DOSE topical 4 % cream - cream topical 05/19/2023 gentamicin DOSE topical 0.1 % ointment - Apply to wound with dressing changes as directed 05/19/2023 mupirocin DOSE topical 2 % ointment - Apply to wound with dressing changes as directed Electronic Signature(s) Signed: 05/19/2023 12:25:45 PM By: Duanne Guess MD FACS Previous Signature: 05/19/2023 11:50:38 AM  FACS Entered By: Duanne Guess on 05/19/2023 11:33:22 -------------------------------------------------------------------------------- Physical Exam Details Patient Name: Date of Service: [name], [name] 05/19/2023 10:30 A M Medical Record Number: [id number] Patient Account Number: [id number] Date of Birth/Sex: Treating RN: [birth year] (67 y.o. F) Primary Care Provider: Cranford Mon Other Clinician: Referring Provider: Treating Provider/Extender: Eloy End Weeks in Treatment: 2 Constitutional . . . . no acute distress. Respiratory Normal work of breathing on room air.. Notes 05/19/2023: There has been no change to her wound. There is still callus buildup around the edges and some slough on the surface. Electronic Signature(s) Signed: 05/19/2023 11:33:50 AM By: Duanne Guess MD FACS Entered By: Duanne Guess on 05/19/2023 11:33:50 [name] ([id number]) [id number] Page 3 of 8 -------------------------------------------------------------------------------- Physician Orders Details Patient Name: Date of Service: [name], [name] 05/19/2023 10:30 A M Medical Record Number: [id number] Patient Account Number: [id number] Date of Birth/Sex: Treating RN: 06-Sep-1955 (67 y.o. [name] Primary Care Provider: Cranford Mon Other Clinician: Referring Provider: Treating Provider/Extender: Cassie Freer in Treatment: 2 The following information was scribed by: Samuella Bruin The information was scribed for: Duanne Guess Verbal / Phone Orders: No Diagnosis Coding ICD-10 Coding Code Description [phone] Non-pressure chronic ulcer of other part of right foot with fat layer exposed Z79.69 Long term (current) use of other immunomodulators and immunosuppressants Z72.0 Tobacco use Follow-up Appointments ppointment in 1 week. - Dr. Lady Gary - room 2 Return A Anesthetic (In clinic) Topical Lidocaine 4% applied to wound bed Bathing/ Shower/ Hygiene May shower and wash wound with soap and water. Off-Loading Other: - forefoot offloading shoe to right foot Additional Orders / Instructions Stop/Decrease Smoking Follow Nutritious Diet - 70-100 grams of protein a day, 500 mg of vitamin C once a day and if tolerated then twice a day, zinc 30-50 mg a day Wound Treatment Wound #1 - T Great oe Wound Laterality: Right Cleanser: Soap and Water 1 x Per Day/30 Days Discharge Instructions: May shower and wash wound with dial antibacterial soap and water prior to dressing change. Cleanser: Wound Cleanser 1 x Per Day/30 Days Discharge Instructions: Cleanse the wound with wound cleanser prior to applying a clean dressing using gauze sponges, not tissue or cotton balls. Topical: Gentamicin 1 x Per Day/30 Days Discharge Instructions: As directed by physician Topical: Mupirocin Ointment 1 x Per Day/30 Days Discharge Instructions: Apply Mupirocin (Bactroban) as instructed Prim Dressing: Promogran Prisma Matrix, 4.34 (sq in) (silver collagen) 1 x Per Day/30 Days ary Discharge Instructions: Moisten collagen with saline or hydrogel Prim Dressing: Optifoam Non-Adhesive Dressing, 4x4 in 1 x Per Day/30 Days ary Discharge Instructions: Cut out a "donut " shape to cushion Secondary Dressing: Woven Gauze Sponges 2x2 in 1 x Per Day/30 Days Discharge Instructions: Apply over primary dressing as  directed. Secured With: Insurance underwriter, Sterile 2x75 (in/in) 1 x Per Day/30 Days Discharge Instructions: Secure with stretch gauze as directed. Secured With: Paper Tape, 2x10 (in/yd) 1 x Per Day/30 Days Discharge Instructions: Secure dressing with tape as directed. Patient Medications [name], [name] ([id number]) [id number] Page 4 of 8 llergies: abatacept, upadacitinib, etanercept, tocilizumab A Notifications Medication Indication Start End 05/19/2023 lidocaine DOSE topical 4 % cream - cream topical 05/19/2023 gentamicin DOSE topical 0.1 % ointment - Apply to wound with dressing changes as directed 05/19/2023 mupirocin DOSE topical 2 % ointment - Apply to wound with dressing changes as directed Electronic Signature(s) Signed: 05/19/2023 12:25:45 PM By: Duanne Guess MD FACS Previous Signature: 05/19/2023 11:50:38 AM  directed. Secured With: Insurance underwriter, Sterile 2x75 (in/in) 1 x Per Day/30 Days Discharge Instructions: Secure with stretch gauze as directed. Secured With: Paper T ape, 2x10 (in/yd) 1 x Per Day/30 Days Discharge Instructions: Secure dressing with tape as directed. 05/19/2023: There has been no change to her wound. There is still callus buildup around the edges and some slough on the surface. I used a curette to debride the callus and slough from her wound. I am going to add a mixture of topical gentamicin and mupirocin to the rash to see if suppression of normal skin flora helps this out and moving the wound forward. We  will continue silver collagen and forefoot offloading sandal. Follow-up in 1 week. Electronic Signature(s) Signed: 05/19/2023 11:52:48 AM By: Duanne Guess MD FACS Entered By: Duanne Guess on 05/19/2023 11:52:47 [name] ([id number]) [id number] Page 7 of 8 -------------------------------------------------------------------------------- HxROS Details Patient Name: Date of Service: [name], [name] 05/19/2023 10:30 A M Medical Record Number: [id number] Patient Account Number: [id number] Date of Birth/Sex: Treating RN: 1956/07/15 (67 y.o. F) Primary Care Provider: Cranford Mon Other Clinician: Referring Provider: Treating Provider/Extender: Cassie Freer in Treatment: 2 Information Obtained From Patient Chart Cardiovascular Medical History: Positive for: Hypertension Gastrointestinal Medical History: Past Medical History Notes: colon polyps Endocrine Medical History: Negative for: Type II Diabetes Integumentary (Skin) Medical History: Past Medical History Notes: viral warts Musculoskeletal Medical History: Positive for: Rheumatoid Arthritis Immunizations Pneumococcal Vaccine: Received Pneumococcal Vaccination: Yes Received Pneumococcal Vaccination On or After 60th Birthday: Yes Implantable Devices None Hospitalization / Surgery History Type of Hospitalization/Surgery Colonoscopy Cesarean section Tubal ligation Family and Social History Cancer: No; Diabetes: Yes - Mother; Heart Disease: Yes - Father; Hereditary Spherocytosis: No; Hypertension: Yes - Father; Kidney Disease: Yes - Father; Lung Disease: No; Seizures: No; Stroke: No; Thyroid Problems: Yes - Father; Tuberculosis: No; Current every day smoker - 1 ppd; Marital Status - Married; Alcohol Use: Never; Drug Use: No History; Caffeine Use: Daily; Financial Concerns: No; Food, Clothing or Shelter Needs: No; Support System Lacking: No; Transportation  Concerns: No Electronic Signature(s) Signed: 05/19/2023 12:25:45 PM By: Duanne Guess MD FACS Entered By: Duanne Guess on 05/19/2023 11:33:28 [name] ([id number]) [id number] Page 8 of 8 -------------------------------------------------------------------------------- SuperBill Details Patient Name: Date of Service: [name], [name] 05/19/2023 Medical Record Number: [id number] Patient Account Number: [id number] Date of Birth/Sex: Treating RN: 12/18/1955 (67 y.o. F) Primary Care Provider: Cranford Mon Other Clinician: Referring Provider: Treating Provider/Extender: Cassie Freer in Treatment: 2 Diagnosis Coding ICD-10 Codes Code Description [phone] Non-pressure chronic ulcer of other part of right foot with fat layer exposed Z79.69 Long term (current) use of other immunomodulators and immunosuppressants Z72.0 Tobacco use Facility Procedures : CPT4 Code: 20254270 Description: [phone] - DEBRIDE WOUND 1ST 20 SQ CM OR < ICD-10 Diagnosis Description L97.512 Non-pressure chronic ulcer of other part of right foot with fat layer exposed Modifier: Quantity: 1 Physician Procedures : CPT4 Code Description Modifier [id number] 99214 - WC PHYS LEVEL 4 - EST PT ICD-10 Diagnosis Description L97.512 Non-pressure chronic ulcer of other part of right foot with fat layer exposed Z79.69 Long term (current) use of other immunomodulators and  immunosuppressants Z72.0 Tobacco use Quantity: 1 : [id number] - WC PHYS DEBR WO ANESTH 20 SQ CM ICD-10 Diagnosis Description L97.512 Non-pressure chronic ulcer of other part of right foot with fat layer exposed Quantity: 1 Electronic Signature(s) Signed: 05/19/2023 11:53:07 AM By: Duanne Guess MD FACS Entered By: Duanne Guess on 05/19/2023  Version By: Duanne Guess MD FACS Entered By: Duanne Guess on 05/19/2023 11:52:10 -------------------------------------------------------------------------------- Problem List Details Patient Name: Date of Service: [name], [name] 05/19/2023 10:30 A M Medical Record Number: [id number] Patient Account Number: [id number] Date of Birth/Sex: Treating RN: [birth year] (67 y.o. F) Primary Care Provider: Cranford Mon Other Clinician: Referring Provider: Treating Provider/Extender: Cassie Freer in Treatment: 2 Active Problems ICD-10 Encounter Code Description Active Date MDM Diagnosis L97.512 Non-pressure chronic ulcer of other part of right foot with fat layer exposed 05/02/2023 No Yes Z79.69 Long term (current) use of other immunomodulators and immunosuppressants 05/02/2023 No Yes Z72.0 Tobacco use 05/02/2023 No Yes Inactive Problems Resolved Problems Electronic Signature(s) Signed: 05/19/2023 11:25:17 AM By: Duanne Guess MD FACS Entered By: Duanne Guess on 05/19/2023  11:25:17 -------------------------------------------------------------------------------- Progress Note Details Patient Name: Date of Service: [name], [name] 05/19/2023 10:30 A M Medical Record Number: [id number] Patient Account Number: [id number] Date of Birth/Sex: Treating RN: 1956-06-23 (67 y.o. F) Primary Care Provider: Cranford Mon Other Clinician: [name] ([id number]) [id number] Page 5 of 8 Referring Provider: Treating Provider/Extender: Cassie Freer in Treatment: 2 Subjective Chief Complaint Information obtained from Patient Patient seen for complaints of Non-Healing Wound. History of Present Illness (HPI) ADMISSION ***ABI R: 1.15*** This is a 67 year old woman with a longstanding history of tobacco abuse, smoking a pack per day since the age of 37. She also has rheumatoid arthritis for which she receives immunomodulators. She was referred by podiatry for further evaluation and management of a right great toe ulcer. In May of this year, she had a callus debridement that revealed an open wound underlying the callus. It has apparently been managed with Betadine dressing changes and a surgical shoe for offloading. There was some question that she had peripheral neuropathy, but she underwent EMG testing that was negative for neuropathy and radiculopathy. The neurologist suggested that she undergo vascular studies, but I do not see that these have been performed. Her most recent visit with podiatry was on September 30. The wound still had not healed as a result, she was referred to the wound care center for further evaluation and treatment. 05/12/2023: No significant change to the wound. There is a fair amount of callus accumulation around it and undermining that is created by her skin. There is slough on the surface. 05/19/2023: There has been no change to her wound. There is still callus buildup around the edges and some  slough on the surface. Patient History Information obtained from Patient, Chart. Family History Diabetes - Mother, Heart Disease - Father, Hypertension - Father, Kidney Disease - Father, Thyroid Problems - Father, No family history of Cancer, Hereditary Spherocytosis, Lung Disease, Seizures, Stroke, Tuberculosis. Social History Current every day smoker - 1 ppd, Marital Status - Married, Alcohol Use - Never, Drug Use - No History, Caffeine Use - Daily. Medical History Cardiovascular Patient has history of Hypertension Endocrine Denies history of Type II Diabetes Musculoskeletal Patient has history of Rheumatoid Arthritis Hospitalization/Surgery History - Colonoscopy. - Cesarean section. - Tubal ligation. Medical A Surgical History Notes nd Gastrointestinal colon polyps Integumentary (Skin) viral warts Objective Constitutional no acute distress. Vitals Time Taken: 10:34 AM, Height: 66 in, Weight: 158 lbs, BMI: 25.5, Temperature: 97.5 F, Pulse: 93 bpm, Respiratory Rate: 18 breaths/min, Blood Pressure: 130/81 mmHg. Respiratory Normal work of breathing on room air.. General Notes: 05/19/2023: There has been no change to her wound. There is still callus buildup around the edges and some slough on the  FACS Entered By: Duanne Guess on 05/19/2023 11:33:22 -------------------------------------------------------------------------------- Physical Exam Details Patient Name: Date of Service: [name], [name] 05/19/2023 10:30 A M Medical Record Number: [id number] Patient Account Number: [id number] Date of Birth/Sex: Treating RN: [birth year] (67 y.o. F) Primary Care Provider: Cranford Mon Other Clinician: Referring Provider: Treating Provider/Extender: Eloy End Weeks in Treatment: 2 Constitutional . . . . no acute distress. Respiratory Normal work of breathing on room air.. Notes 05/19/2023: There has been no change to her wound. There is still callus buildup around the edges and some slough on the surface. Electronic Signature(s) Signed: 05/19/2023 11:33:50 AM By: Duanne Guess MD FACS Entered By: Duanne Guess on 05/19/2023 11:33:50 [name] ([id number]) [id number] Page 3 of 8 -------------------------------------------------------------------------------- Physician Orders Details Patient Name: Date of Service: [name], [name] 05/19/2023 10:30 A M Medical Record Number: [id number] Patient Account Number: [id number] Date of Birth/Sex: Treating RN: 06-Sep-1955 (67 y.o. [name] Primary Care Provider: Cranford Mon Other Clinician: Referring Provider: Treating Provider/Extender: Cassie Freer in Treatment: 2 The following information was scribed by: Samuella Bruin The information was scribed for: Duanne Guess Verbal / Phone Orders: No Diagnosis Coding ICD-10 Coding Code Description [phone] Non-pressure chronic ulcer of other part of right foot with fat layer exposed Z79.69 Long term (current) use of other immunomodulators and immunosuppressants Z72.0 Tobacco use Follow-up Appointments ppointment in 1 week. - Dr. Lady Gary - room 2 Return A Anesthetic (In clinic) Topical Lidocaine 4% applied to wound bed Bathing/ Shower/ Hygiene May shower and wash wound with soap and water. Off-Loading Other: - forefoot offloading shoe to right foot Additional Orders / Instructions Stop/Decrease Smoking Follow Nutritious Diet - 70-100 grams of protein a day, 500 mg of vitamin C once a day and if tolerated then twice a day, zinc 30-50 mg a day Wound Treatment Wound #1 - T Great oe Wound Laterality: Right Cleanser: Soap and Water 1 x Per Day/30 Days Discharge Instructions: May shower and wash wound with dial antibacterial soap and water prior to dressing change. Cleanser: Wound Cleanser 1 x Per Day/30 Days Discharge Instructions: Cleanse the wound with wound cleanser prior to applying a clean dressing using gauze sponges, not tissue or cotton balls. Topical: Gentamicin 1 x Per Day/30 Days Discharge Instructions: As directed by physician Topical: Mupirocin Ointment 1 x Per Day/30 Days Discharge Instructions: Apply Mupirocin (Bactroban) as instructed Prim Dressing: Promogran Prisma Matrix, 4.34 (sq in) (silver collagen) 1 x Per Day/30 Days ary Discharge Instructions: Moisten collagen with saline or hydrogel Prim Dressing: Optifoam Non-Adhesive Dressing, 4x4 in 1 x Per Day/30 Days ary Discharge Instructions: Cut out a "donut " shape to cushion Secondary Dressing: Woven Gauze Sponges 2x2 in 1 x Per Day/30 Days Discharge Instructions: Apply over primary dressing as  directed. Secured With: Insurance underwriter, Sterile 2x75 (in/in) 1 x Per Day/30 Days Discharge Instructions: Secure with stretch gauze as directed. Secured With: Paper Tape, 2x10 (in/yd) 1 x Per Day/30 Days Discharge Instructions: Secure dressing with tape as directed. Patient Medications [name], [name] ([id number]) [id number] Page 4 of 8 llergies: abatacept, upadacitinib, etanercept, tocilizumab A Notifications Medication Indication Start End 05/19/2023 lidocaine DOSE topical 4 % cream - cream topical 05/19/2023 gentamicin DOSE topical 0.1 % ointment - Apply to wound with dressing changes as directed 05/19/2023 mupirocin DOSE topical 2 % ointment - Apply to wound with dressing changes as directed Electronic Signature(s) Signed: 05/19/2023 12:25:45 PM By: Duanne Guess MD FACS Previous Signature: 05/19/2023 11:50:38 AM

## 2023-05-22 ENCOUNTER — Ambulatory Visit: Payer: Medicare Other | Admitting: Podiatry

## 2023-05-24 ENCOUNTER — Encounter (HOSPITAL_BASED_OUTPATIENT_CLINIC_OR_DEPARTMENT_OTHER): Payer: Medicare Other | Admitting: General Surgery

## 2023-05-24 DIAGNOSIS — I10 Essential (primary) hypertension: Secondary | ICD-10-CM | POA: Diagnosis not present

## 2023-05-24 DIAGNOSIS — Z860100 Personal history of colon polyps, unspecified: Secondary | ICD-10-CM | POA: Diagnosis not present

## 2023-05-24 DIAGNOSIS — S91301A Unspecified open wound, right foot, initial encounter: Secondary | ICD-10-CM | POA: Diagnosis not present

## 2023-05-24 DIAGNOSIS — M069 Rheumatoid arthritis, unspecified: Secondary | ICD-10-CM | POA: Diagnosis not present

## 2023-05-24 DIAGNOSIS — L97512 Non-pressure chronic ulcer of other part of right foot with fat layer exposed: Secondary | ICD-10-CM | POA: Diagnosis not present

## 2023-05-24 DIAGNOSIS — Z7969 Long term (current) use of other immunomodulators and immunosuppressants: Secondary | ICD-10-CM | POA: Diagnosis not present

## 2023-05-24 DIAGNOSIS — F17210 Nicotine dependence, cigarettes, uncomplicated: Secondary | ICD-10-CM | POA: Diagnosis not present

## 2023-05-24 NOTE — Progress Notes (Signed)
directed. Paper Tape, 2x10 (in/yd) Discharge Instruction: Secure dressing with tape as directed. Compression Wrap Compression Stockings Add-Ons Electronic Signature(s) Signed: 05/24/2023 3:10:04 PM By: Tommie Ard RN Entered By: Tommie Ard on 05/24/2023 10:35:37 -------------------------------------------------------------------------------- Vitals Details Patient Name: Date of Service: [name], [name] 05/24/2023 1:45 PM Medical Record Number: [id number] Patient Account Number: [id number] Date of Birth/Sex: Treating RN: 1956-03-24 (67 y.o. [address] [place], [place] ([id number]) [id number] Page 8 of 8 Primary Care Bridney Guadarrama: Cranford Mon Other Clinician: Referring Torina Ey: Treating Justyne Roell/Extender: Cassie Freer in Treatment: 3 Vital Signs Time Taken: 13:30 Temperature (F): 97.8 Height (in): 66 Pulse (bpm): 71 Weight (lbs): 158 Respiratory Rate (breaths/min): 18 Body Mass Index (BMI): 25.5 Blood Pressure (mmHg): 144/83 Reference Range: 80 - 120 mg / dl Electronic Signature(s) Signed: 05/24/2023 3:10:04 PM By: Tommie Ard RN Entered By: Tommie Ard on 05/24/2023 10:31:14  directed. Paper Tape, 2x10 (in/yd) Discharge Instruction: Secure dressing with tape as directed. Compression Wrap Compression Stockings Add-Ons Electronic Signature(s) Signed: 05/24/2023 3:10:04 PM By: Tommie Ard RN Entered By: Tommie Ard on 05/24/2023 10:35:37 -------------------------------------------------------------------------------- Vitals Details Patient Name: Date of Service: [name], [name] 05/24/2023 1:45 PM Medical Record Number: [id number] Patient Account Number: [id number] Date of Birth/Sex: Treating RN: 1956-03-24 (67 y.o. [address] [place], [place] ([id number]) [id number] Page 8 of 8 Primary Care Bridney Guadarrama: Cranford Mon Other Clinician: Referring Torina Ey: Treating Justyne Roell/Extender: Cassie Freer in Treatment: 3 Vital Signs Time Taken: 13:30 Temperature (F): 97.8 Height (in): 66 Pulse (bpm): 71 Weight (lbs): 158 Respiratory Rate (breaths/min): 18 Body Mass Index (BMI): 25.5 Blood Pressure (mmHg): 144/83 Reference Range: 80 - 120 mg / dl Electronic Signature(s) Signed: 05/24/2023 3:10:04 PM By: Tommie Ard RN Entered By: Tommie Ard on 05/24/2023 10:31:14  regimen initiated : 05/02/2023 Topical wound management initiated : 05/02/2023 Notes: Electronic Signature(s) Signed: 05/24/2023 3:10:04 PM By: Tommie Ard RN Entered By: Tommie Ard on 05/24/2023 10:42:59 -------------------------------------------------------------------------------- Pain Assessment Details Patient Name: Date of Service: [name], [name] 05/24/2023 1:45 PM Medical Record Number: [id number] Patient Account Number: [id number] Date of Birth/Sex: Treating RN: 1956-06-25  (67 y.o. [name] Primary Care Chabely Norby: Cranford Mon Other Clinician: Referring Jalexa Pifer: Treating Nell Schrack/Extender: Cassie Freer in Treatment: 3 Active Problems Location of Pain Severity and Description of Pain Patient Has Paino No Site Locations Pain Management and Medication Current Pain Management: Electronic Signature(s) Signed: 05/24/2023 3:10:04 PM By: Tommie Ard RN Entered By: Tommie Ard on 05/24/2023 10:31:19 -------------------------------------------------------------------------------- Patient/Caregiver Education Details Patient Name: Date of Service: [name] 10/30/2024andnbsp1:45 PM Medical Record Number: [id number] Patient Account Number: [id number] Date of Birth/Gender: Treating RN: [birth year] (67 y.o. [name] Primary Care Physician: Cranford Mon Other Clinician: Referring Physician: Treating Physician/Extender: Cassie Freer in Treatment: 3 [place], [place] ([id number]) [id number] Page 6 of 8 Education Assessment Education Provided To: Patient Education Topics Provided Wound Debridement: Methods: Explain/Verbal Responses: Reinforcements needed, State content correctly Wound/Skin Impairment: Methods: Explain/Verbal Responses: Reinforcements needed, State content correctly Electronic Signature(s) Signed: 05/24/2023 3:10:04 PM By: Tommie Ard RN Entered By: Tommie Ard on 05/24/2023 10:43:25 -------------------------------------------------------------------------------- Wound Assessment Details Patient Name: Date of Service: [name], [name] 05/24/2023 1:45 PM Medical Record Number: [id number] Patient Account Number: [id number] Date of Birth/Sex: Treating RN: October 17, 1955 (67 y.o. [name], Jamie Primary Care Dewitt Judice: Cranford Mon Other Clinician: Referring Shivali Quackenbush: Treating Chosen Garron/Extender: Cassie Freer in  Treatment: 3 Wound Status Wound Number: 1 Primary Etiology: Atypical Wound Location: Right T Great oe Wound Status: Open Wounding Event: Gradually Appeared Comorbid History: Hypertension, Rheumatoid Arthritis Date Acquired: 12/07/2022 Weeks Of Treatment: 3 Clustered Wound: No Photos Wound Measurements Length: (cm) 0.2 Width: (cm) 0.3 Depth: (cm) 0.2 Area: (cm) 0.047 Volume: (cm) 0.009 % Reduction in Area: 33.8% % Reduction in Volume: 35.7% Epithelialization: Small (1-33%) Tunneling: No Undermining: No Wound Description Classification: Full Thickness Without Exposed Support Wound Margin: Distinct, outline attached Exudate Amount: Medium Exudate Type: Serosanguineous Exudate Color: red, brown Roscher, Farren ([id number]) Structures Foul Odor After Cleansing: No Slough/Fibrino Yes [phone].pdf Page 7 of 8 Wound Bed Granulation Amount: Large (67-100%) Exposed Structure Granulation Quality: Red, Pink Fascia Exposed: No Necrotic Amount: Small (1-33%) Fat Layer (Subcutaneous Tissue) Exposed: Yes Necrotic Quality: Adherent Slough Tendon Exposed: No Muscle Exposed: No Joint Exposed: No Bone Exposed: No Periwound Skin Texture Texture Color No Abnormalities Noted: No No Abnormalities Noted: Yes Callus: Yes Temperature / Pain Temperature: No Abnormality Moisture No Abnormalities Noted: Yes Treatment Notes Wound #1 (Toe Great) Wound Laterality: Right Cleanser Soap and Water Discharge Instruction: May shower and wash wound with dial antibacterial soap and water prior to dressing change. Wound Cleanser Discharge Instruction: Cleanse the wound with wound cleanser prior to applying a clean dressing using gauze sponges, not tissue or cotton balls. Peri-Wound Care Topical Gentamicin Discharge Instruction: As directed by physician Mupirocin Ointment Discharge Instruction: Apply Mupirocin (Bactroban) as instructed Primary Dressing Promogran  Prisma Matrix, 4.34 (sq in) (silver collagen) Discharge Instruction: Moisten collagen with saline or hydrogel Optifoam Non-Adhesive Dressing, 4x4 in Discharge Instruction: Cut out a "donut " shape to cushion Secondary Dressing Woven Gauze Sponges 2x2 in Discharge Instruction: Apply over primary dressing as directed. Secured With Conforming Stretch Gauze Bandage, Sterile 2x75 (in/in) Discharge Instruction: Secure with stretch gauze as  Robbins, [name] ([id number]) [id number] Page 1 of 8 Visit Report for 05/24/2023 Arrival Information Details Patient Name: Date of Service: RYELYN, HENCH 05/24/2023 1:45 PM Medical Record Number: [id number] Patient Account Number: [id number] Date of Birth/Sex: Treating RN: [birth year] (67 y.o. [name], Jamie Primary Care Annamarie Yamaguchi: Cranford Mon Other Clinician: Referring Patches Mcdonnell: Treating Crosby Oriordan/Extender: Cassie Freer in Treatment: 3 Visit Information History Since Last Visit Added or deleted any medications: No Patient Arrived: Ambulatory Any new allergies or adverse reactions: No Arrival Time: 13:28 Had a fall or experienced change in No Accompanied By: spouse activities of daily living that may affect Transfer Assistance: None risk of falls: Patient Identification Verified: Yes Signs or symptoms of abuse/neglect since last visito No Secondary Verification Process Completed: Yes Hospitalized since last visit: No Implantable device outside of the clinic excluding No cellular tissue based products placed in the center since last visit: Has Dressing in Place as Prescribed: Yes Pain Present Now: Yes Electronic Signature(s) Signed: 05/24/2023 3:10:04 PM By: Tommie Ard RN Entered By: Tommie Ard on 05/24/2023 10:29:48 -------------------------------------------------------------------------------- Encounter Discharge Information Details Patient Name: Date of Service: [name], [name] 05/24/2023 1:45 PM Medical Record Number: [id number] Patient Account Number: [id number] Date of Birth/Sex: Treating RN: 06-11-1956 (67 y.o. [name] Primary Care Salome Hautala: Cranford Mon Other Clinician: Referring Avabella Wailes: Treating Desi Rowe/Extender: Cassie Freer in Treatment: 3 Encounter Discharge Information Items Post Procedure Vitals Discharge Condition: Stable Temperature (F): 97.8 Ambulatory  Status: Ambulatory Pulse (bpm): 71 Discharge Destination: Home Respiratory Rate (breaths/min): 18 Transportation: Private Auto Blood Pressure (mmHg): 144/83 Accompanied By: self Schedule Follow-up Appointment: Yes Clinical Summary of Care: Electronic Signature(s) Signed: 05/24/2023 3:10:04 PM By: Tommie Ard RN Entered By: Tommie Ard on 05/24/2023 10:55:52 [name] ([id number]) [id number] Page 2 of 8 -------------------------------------------------------------------------------- Lower Extremity Assessment Details Patient Name: Date of Service: [name], [name] 05/24/2023 1:45 PM Medical Record Number: [id number] Patient Account Number: [id number] Date of Birth/Sex: Treating RN: 06-22-1956 (67 y.o. [name] Primary Care Keyundra Fant: Cranford Mon Other Clinician: Referring Jearld Hemp: Treating Antonietta Lansdowne/Extender: Cassie Freer in Treatment: 3 Edema Assessment Assessed: [Left: No] [Right: No] [Left: Edema] [Right: :] Calf Left: Right: Point of Measurement: From Medial Instep 36.4 cm Ankle Left: Right: Point of Measurement: From Medial Instep 22.1 cm Vascular Assessment Extremity colors, hair growth, and conditions: Extremity Color: [Right:Normal] Hair Growth on Extremity: [Right:Yes] Temperature of Extremity: [Right:Warm] Capillary Refill: [Right:< 3 seconds] Dependent Rubor: [Right:No No] Electronic Signature(s) Signed: 05/24/2023 3:10:04 PM By: Tommie Ard RN Entered By: Tommie Ard on 05/24/2023 10:31:26 -------------------------------------------------------------------------------- Multi Wound Chart Details Patient Name: Date of Service: [name], MORQUECHO 05/24/2023 1:45 PM Medical Record Number: [id number] Patient Account Number: [id number] Date of Birth/Sex: Treating RN: 1956/02/10 (67 y.o. F) Primary Care Peretz Thieme: Cranford Mon Other Clinician: Referring Danelly Hassinger: Treating Akera Snowberger/Extender:  Cassie Freer in Treatment: 3 Vital Signs Height(in): 66 Pulse(bpm): 71 Weight(lbs): 158 Blood Pressure(mmHg): 144/83 Body Mass Index(BMI): 25.5 Temperature(F): 97.8 Respiratory Rate(breaths/min): 18 [1:Photos:] [N/A:N/A] Right T Great oe N/A N/A Wound Location: Gradually Appeared N/A N/A Wounding Event: Atypical N/A N/A Primary Etiology: Hypertension, Rheumatoid Arthritis N/A N/A Comorbid History: 12/07/2022 N/A N/A Date Acquired: 3 N/A N/A Weeks of Treatment: Open N/A N/A Wound Status: No N/A N/A Wound Recurrence: 0.2x0.3x0.2 N/A N/A Measurements L x W x D (cm) 0.047 N/A N/A A (cm) : rea 0.009 N/A N/A Volume (cm) : 33.80% N/A N/A % Reduction in A rea: 35.70% N/A N/A %

## 2023-05-25 NOTE — Progress Notes (Signed)
MD FACS Previous Signature: 05/24/2023 2:51:20 PM Version By: Duanne Guess MD FACS Entered By: Duanne Guess on 05/24/2023 15:01:41 -------------------------------------------------------------------------------- HxROS Details Patient Name: Date of Service: CECIL, AUGUST 05/24/2023 1:45 PM Medical Record Number: [id number] Patient Account Number: [id number] Date of Birth/Sex: Treating RN: February 24, 1956 (67 y.o. F) Primary Care Provider: Cranford Mon Other Clinician: Referring Provider: Treating Provider/Extender: Cassie Freer in Treatment: 3 [place], [place] ([id number])  [id number] Page 7 of 8 Information Obtained From Patient Chart Cardiovascular Medical History: Positive for: Hypertension Gastrointestinal Medical History: Past Medical History Notes: colon polyps Endocrine Medical History: Negative for: Type II Diabetes Integumentary (Skin) Medical History: Past Medical History Notes: viral warts Musculoskeletal Medical History: Positive for: Rheumatoid Arthritis Immunizations Pneumococcal Vaccine: Received Pneumococcal Vaccination: Yes Received Pneumococcal Vaccination On or After 60th Birthday: Yes Implantable Devices None Hospitalization / Surgery History Type of Hospitalization/Surgery Colonoscopy Cesarean section Tubal ligation Family and Social History Cancer: No; Diabetes: Yes - Mother; Heart Disease: Yes - Father; Hereditary Spherocytosis: No; Hypertension: Yes - Father; Kidney Disease: Yes - Father; Lung Disease: No; Seizures: No; Stroke: No; Thyroid Problems: Yes - Father; Tuberculosis: No; Current every day smoker - 1 ppd; Marital Status - Married; Alcohol Use: Never; Drug Use: No History; Caffeine Use: Daily; Financial Concerns: No; Food, Clothing or Shelter Needs: No; Support System Lacking: No; Transportation Concerns: No Psychologist, prison and probation services) Signed: 05/24/2023 5:50:32 PM By: Duanne Guess MD FACS Entered By: Duanne Guess on 05/24/2023 14:48:24 -------------------------------------------------------------------------------- SuperBill Details Patient Name: Date of Service: MIRANDA, MARMER 05/24/2023 Medical Record Number: [id number] Patient Account Number: [id number] Date of Birth/Sex: Treating RN: 1955-08-29 (67 y.o. F) Primary Care Provider: Cranford Mon Other Clinician: Referring Provider: Treating Provider/Extender: Cassie Freer in Treatment: 3 Diagnosis Coding ICD-10 Codes Code Description MEKAH, KOELSCH ([id number])  [id number] Page 8 of 8 L97.512 Non-pressure chronic ulcer of other part of right foot with fat layer exposed Z79.69 Long term (current) use of other immunomodulators and immunosuppressants Z72.0 Tobacco use Facility Procedures : CPT4 Code: [id number] Description: [id number] - DEBRIDE WOUND 1ST 20 SQ CM OR < ICD-10 Diagnosis Description L97.512 Non-pressure chronic ulcer of other part of right foot with fat layer exposed Modifier: Quantity: 1 Physician Procedures : CPT4 Code Description Modifier [id number] - WC PHYS LEVEL 3 - EST PT ICD-10 Diagnosis Description L97.512 Non-pressure chronic ulcer of other part of right foot with fat layer exposed Z79.69 Long term (current) use of other immunomodulators and  immunosuppressants Z72.0 Tobacco use Quantity: 1 : [id number] - WC PHYS DEBR WO ANESTH 20 SQ CM ICD-10 Diagnosis Description L97.512 Non-pressure chronic ulcer of other part of right foot with fat layer exposed Quantity: 1 Electronic Signature(s) Signed: 05/24/2023 3:01:56 PM By: Duanne Guess MD FACS Entered By: Duanne Guess on 05/24/2023 15:01:55  Non-pressure chronic ulcer of other part of right foot with fat layer exposed 05/02/2023 No Yes Z79.69 Long term (current) use of other immunomodulators and immunosuppressants 05/02/2023 No Yes Z72.0 Tobacco use 05/02/2023 No Yes Inactive Problems Resolved Problems Electronic Signature(s) Signed: 05/24/2023 2:45:05 PM By: Duanne Guess MD FACS Previous Signature: 05/24/2023 1:44:52 PM Version By: Duanne Guess MD FACS Entered By: Duanne Guess on 05/24/2023 14:45:04 -------------------------------------------------------------------------------- Progress Note Details Patient Name: Date of Service: TAIYA, BOGUS 05/24/2023 1:45 PM Medical Record Number: [id number] Patient Account Number: [id number] Date of Birth/Sex: Treating RN: 1955/08/03 (67 y.o. F) Primary Care Provider: Cranford Mon Other Clinician: Referring Provider: Treating Provider/Extender: Cassie Freer in Treatment: 3 Subjective Chief Complaint Information obtained from Patient Patient seen for complaints of Non-Healing Wound. History of Present Illness (HPI) ADMISSION ***ABI R: 1.15[place], [place] ([id number]) [id number] Page 5 of 8 This is a 67 year old woman with a longstanding history of tobacco abuse, smoking a pack per day since the age of 4. She also has rheumatoid arthritis for which she receives immunomodulators. She was referred by podiatry for further evaluation and management of a right great toe ulcer. In May of this year, she had a callus debridement that revealed an open wound underlying the callus. It has apparently been managed with Betadine dressing changes and a surgical shoe for offloading. There was some question that she had peripheral neuropathy, but she underwent EMG testing that was negative for neuropathy and radiculopathy. The neurologist suggested that she undergo vascular studies, but I do not see that these have been performed. Her most recent visit with podiatry was on September 30. The wound still had not healed as a result, she was referred to the wound care center for further evaluation and treatment. 05/12/2023: No significant change to the wound. There is a fair amount of callus accumulation around it and undermining that is created by her skin. There is slough on the surface. 05/19/2023: There has been no change to her wound. There is still callus buildup around the edges and some slough on the surface. 05/24/2023: No significant change to her wound except that might be slightly shallower. Patient History Information obtained from Patient, Chart. Family History Diabetes - Mother, Heart Disease - Father, Hypertension - Father, Kidney Disease - Father, Thyroid Problems - Father, No family history of Cancer, Hereditary Spherocytosis, Lung Disease, Seizures, Stroke, Tuberculosis. Social History Current every  day smoker - 1 ppd, Marital Status - Married, Alcohol Use - Never, Drug Use - No History, Caffeine Use - Daily. Medical History Cardiovascular Patient has history of Hypertension Endocrine Denies history of Type II Diabetes Musculoskeletal Patient has history of Rheumatoid Arthritis Hospitalization/Surgery History - Colonoscopy. - Cesarean section. - Tubal ligation. Medical A Surgical History Notes nd Gastrointestinal colon polyps Integumentary (Skin) viral warts Objective Constitutional Slightly hypertensive. no acute distress. Vitals Time Taken: 1:30 PM, Height: 66 in, Weight: 158 lbs, BMI: 25.5, Temperature: 97.8 F, Pulse: 71 bpm, Respiratory Rate: 18 breaths/min, Blood Pressure: 144/83 mmHg. Respiratory Normal work of breathing on room air.. General Notes: 05/24/2023: No significant change to her wound except that might be slightly shallower. Integumentary (Hair, Skin) Wound #1 status is Open. Original cause of wound was Gradually Appeared. The date acquired was: 12/07/2022. The wound has been in treatment 3 weeks. The wound is located on the Right T Great. The wound measures 0.2cm length x 0.3cm width x 0.2cm depth; 0.047cm^2 area and 0.009cm^3 volume. There is Fat oe Layer (Subcutaneous Tissue) exposed.  Benedict, [name] ([id number]) [id number] Page 1 of 8 Visit Report for 05/24/2023 Chief Complaint Document Details Patient Name: Date of Service: LAVONNE, MABERRY 05/24/2023 1:45 PM Medical Record Number: [id number] Patient Account Number: [id number] Date of Birth/Sex: Treating RN: 08-Mar-1956 (67 y.o. F) Primary Care Provider: Cranford Mon Other Clinician: Referring Provider: Treating Provider/Extender: Cassie Freer in Treatment: 3 Information Obtained from: Patient Chief Complaint Patient seen for complaints of Non-Healing Wound. Electronic Signature(s) Signed: 05/24/2023 2:45:17 PM By: Duanne Guess MD FACS Entered By: Duanne Guess on 05/24/2023 14:45:17 -------------------------------------------------------------------------------- Debridement Details Patient Name: Date of Service: JENESSA, SIELER 05/24/2023 1:45 PM Medical Record Number: [id number] Patient Account Number: [id number] Date of Birth/Sex: Treating RN: 09-24-1955 (67 y.o. Kateri Mc Primary Care Provider: Cranford Mon Other Clinician: Referring Provider: Treating Provider/Extender: Cassie Freer in Treatment: 3 Debridement Performed for Assessment: Wound #1 Right T Great oe Performed By: Physician Duanne Guess, MD The following information was scribed by: Tommie Ard The information was scribed for: Duanne Guess Debridement Type: Debridement Level of Consciousness (Pre-procedure): Awake and Alert Pre-procedure Verification/Time Out Yes - 13:45 Taken: Start Time: 13:46 Percent of Wound Bed Debrided: 100% T Area Debrided (cm): otal 0.05 Tissue and other material debrided: Non-Viable, Callus, Slough, Skin: Epidermis, Slough Level: Skin/Epidermis Debridement Description: Selective/Open Wound Instrument: Curette Bleeding: Minimum Hemostasis Achieved: Pressure Response to Treatment: Procedure was tolerated  well Level of Consciousness (Post- Awake and Alert procedure): Post Debridement Measurements of Total Wound Length: (cm) 0.2 Width: (cm) 0.3 Depth: (cm) 0.2 Volume: (cm) 0.009 Character of Wound/Ulcer Post Debridement: Requires Further Debridement Post Procedure Diagnosis [name] ([id number]) [id number] Page 2 of 8 Same as Pre-procedure Electronic Signature(s) Signed: 05/24/2023 3:10:04 PM By: Tommie Ard RN Signed: 05/24/2023 5:50:32 PM By: Duanne Guess MD FACS Entered By: Tommie Ard on 05/24/2023 13:55:27 -------------------------------------------------------------------------------- HPI Details Patient Name: Date of Service: ARCENIA, CURVIN 05/24/2023 1:45 PM Medical Record Number: [id number] Patient Account Number: [id number] Date of Birth/Sex: Treating RN: 1956/04/27 (67 y.o. F) Primary Care Provider: Cranford Mon Other Clinician: Referring Provider: Treating Provider/Extender: Cassie Freer in Treatment: 3 History of Present Illness HPI Description: ADMISSION ***ABI R: 1.15*** This is a 67 year old woman with a longstanding history of tobacco abuse, smoking a pack per day since the age of 40. She also has rheumatoid arthritis for which she receives immunomodulators. She was referred by podiatry for further evaluation and management of a right great toe ulcer. In May of this year, she had a callus debridement that revealed an open wound underlying the callus. It has apparently been managed with Betadine dressing changes and a surgical shoe for offloading. There was some question that she had peripheral neuropathy, but she underwent EMG testing that was negative for neuropathy and radiculopathy. The neurologist suggested that she undergo vascular studies, but I do not see that these have been performed. Her most recent visit with podiatry was on September 30. The wound still had not healed as a result, she  was referred to the wound care center for further evaluation and treatment. 05/12/2023: No significant change to the wound. There is a fair amount of callus accumulation around it and undermining that is created by her skin. There is slough on the surface. 05/19/2023: There has been no change to her wound. There is still callus buildup around the edges and some slough on the surface. 05/24/2023: No significant change to her wound except that might be slightly shallower. Electronic Signature(s)  Signed: 05/24/2023 2:46:01 PM By: Duanne Guess MD FACS Entered By: Duanne Guess on 05/24/2023 14:46:01 -------------------------------------------------------------------------------- Physical Exam Details Patient Name: Date of Service: DONNIE, PRUCHA 05/24/2023 1:45 PM Medical Record Number: [id number] Patient Account Number: [id number] Date of Birth/Sex: Treating RN: [birth year] (67 y.o. F) Primary Care Provider: Cranford Mon Other Clinician: Referring Provider: Treating Provider/Extender: Eloy End Weeks in Treatment: 3 Constitutional Slightly hypertensive. . . . no acute distress. Respiratory Normal work of breathing on room air.. Notes 05/24/2023: No significant change to her wound except that might be slightly shallower. Electronic Signature(s) Signed: 05/24/2023 2:48:52 PM By: Duanne Guess MD FACS [place], [name] [phone] By: Duanne Guess MD FACS [id number] Page 3 of 8 Signed: 05/24/2023 2:48:52 Entered By: Duanne Guess on 05/24/2023 14:48:52 -------------------------------------------------------------------------------- Physician Orders Details Patient Name: Date of Service: TANVIKA, ADDEO 05/24/2023 1:45 PM Medical Record Number: [id number] Patient Account Number: [id number] Date of Birth/Sex: Treating RN: 03/24/1956 (67 y.o. 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 - Dr. Lady Gary - room 2 Return A Anesthetic (In clinic) Topical Lidocaine 4% applied to wound bed Bathing/ Shower/ Hygiene May shower and wash wound with soap and water. Off-Loading Other: - forefoot offloading shoe to right foot Additional Orders / Instructions Stop/Decrease Smoking Follow Nutritious Diet - 70-100 grams of protein a day, 500 mg of vitamin C once a day and if tolerated then twice a day, zinc 30-50 mg a day Wound Treatment Wound #1 - T Great oe Wound Laterality: Right Cleanser: Soap and Water 1 x Per Day/30 Days Discharge Instructions: May shower and wash wound with dial antibacterial soap and water prior to dressing change. Cleanser: Wound Cleanser 1 x Per Day/30 Days Discharge Instructions: Cleanse the wound with wound cleanser prior to applying a clean dressing using gauze sponges, not tissue or cotton balls. Topical: Gentamicin 1 x Per Day/30 Days Discharge Instructions: As directed by physician Topical: Mupirocin Ointment 1 x Per Day/30 Days Discharge Instructions: Apply Mupirocin (Bactroban) as instructed Prim Dressing: Promogran Prisma Matrix, 4.34 (sq in) (silver collagen) 1 x Per Day/30 Days ary Discharge Instructions: Moisten collagen with saline or hydrogel Prim Dressing: Optifoam Non-Adhesive Dressing, 4x4 in 1 x Per Day/30 Days ary Discharge Instructions: Cut out a "donut " shape to cushion Secondary Dressing: Woven Gauze Sponges 2x2 in 1 x Per Day/30 Days Discharge Instructions: Apply over primary dressing as directed. Secured With:  Insurance underwriter, Sterile 2x75 (in/in) 1 x Per Day/30 Days Discharge Instructions: Secure with stretch gauze as directed. Secured With: Paper Tape, 2x10 (in/yd) 1 x Per Day/30 Days Discharge Instructions: Secure dressing with tape as directed. [place], [name] ([id number]) [id number] Page 4 of 8 Electronic Signature(s) Signed: 05/24/2023 5:50:32 PM By: Duanne Guess MD FACS Entered By: Duanne Guess on 05/24/2023 14:50:01 -------------------------------------------------------------------------------- Problem List Details Patient Name: Date of Service: INDRIA, DOOD 05/24/2023 1:45 PM Medical Record Number: [id number] Patient Account Number: [id number] Date of Birth/Sex: Treating RN: 30-Sep-1955 (67 y.o. F) Primary Care Provider: Cranford Mon Other Clinician: Referring Provider: Treating Provider/Extender: Cassie Freer in Treatment: 3 Active Problems ICD-10 Encounter Code Description Active Date MDM Diagnosis [phone]  [facility], [name] ([id number]) [id number] Page 1 of 8 Visit Report for 05/24/2023 Chief Complaint Document Details Patient Name: Date of Service: LAVONNE, MABERRY 05/24/2023 1:45 PM Medical Record Number: [id number] Patient Account Number: [id number] Date of Birth/Sex: Treating RN: 08-Mar-1956 (67 y.o. F) Primary Care Provider: Cranford Mon Other Clinician: Referring Provider: Treating Provider/Extender: Cassie Freer in Treatment: 3 Information Obtained from: Patient Chief Complaint Patient seen for complaints of Non-Healing Wound. Electronic Signature(s) Signed: 05/24/2023 2:45:17 PM By: Duanne Guess MD FACS Entered By: Duanne Guess on 05/24/2023 14:45:17 -------------------------------------------------------------------------------- Debridement Details Patient Name: Date of Service: JENESSA, SIELER 05/24/2023 1:45 PM Medical Record Number: [id number] Patient Account Number: [id number] Date of Birth/Sex: Treating RN: 09-24-1955 (67 y.o. Kateri Mc Primary Care Provider: Cranford Mon Other Clinician: Referring Provider: Treating Provider/Extender: Cassie Freer in Treatment: 3 Debridement Performed for Assessment: Wound #1 Right T Great oe Performed By: Physician Duanne Guess, MD The following information was scribed by: Tommie Ard The information was scribed for: Duanne Guess Debridement Type: Debridement Level of Consciousness (Pre-procedure): Awake and Alert Pre-procedure Verification/Time Out Yes - 13:45 Taken: Start Time: 13:46 Percent of Wound Bed Debrided: 100% T Area Debrided (cm): otal 0.05 Tissue and other material debrided: Non-Viable, Callus, Slough, Skin: Epidermis, Slough Level: Skin/Epidermis Debridement Description: Selective/Open Wound Instrument: Curette Bleeding: Minimum Hemostasis Achieved: Pressure Response to Treatment: Procedure was tolerated  well Level of Consciousness (Post- Awake and Alert procedure): Post Debridement Measurements of Total Wound Length: (cm) 0.2 Width: (cm) 0.3 Depth: (cm) 0.2 Volume: (cm) 0.009 Character of Wound/Ulcer Post Debridement: Requires Further Debridement Post Procedure Diagnosis [name] ([id number]) [id number] Page 2 of 8 Same as Pre-procedure Electronic Signature(s) Signed: 05/24/2023 3:10:04 PM By: Tommie Ard RN Signed: 05/24/2023 5:50:32 PM By: Duanne Guess MD FACS Entered By: Tommie Ard on 05/24/2023 13:55:27 -------------------------------------------------------------------------------- HPI Details Patient Name: Date of Service: ARCENIA, CURVIN 05/24/2023 1:45 PM Medical Record Number: [id number] Patient Account Number: [id number] Date of Birth/Sex: Treating RN: 1956/04/27 (67 y.o. F) Primary Care Provider: Cranford Mon Other Clinician: Referring Provider: Treating Provider/Extender: Cassie Freer in Treatment: 3 History of Present Illness HPI Description: ADMISSION ***ABI R: 1.15*** This is a 67 year old woman with a longstanding history of tobacco abuse, smoking a pack per day since the age of 40. She also has rheumatoid arthritis for which she receives immunomodulators. She was referred by podiatry for further evaluation and management of a right great toe ulcer. In May of this year, she had a callus debridement that revealed an open wound underlying the callus. It has apparently been managed with Betadine dressing changes and a surgical shoe for offloading. There was some question that she had peripheral neuropathy, but she underwent EMG testing that was negative for neuropathy and radiculopathy. The neurologist suggested that she undergo vascular studies, but I do not see that these have been performed. Her most recent visit with podiatry was on September 30. The wound still had not healed as a result, she  was referred to the wound care center for further evaluation and treatment. 05/12/2023: No significant change to the wound. There is a fair amount of callus accumulation around it and undermining that is created by her skin. There is slough on the surface. 05/19/2023: There has been no change to her wound. There is still callus buildup around the edges and some slough on the surface. 05/24/2023: No significant change to her wound except that might be slightly shallower. Electronic Signature(s)

## 2023-05-31 ENCOUNTER — Encounter (HOSPITAL_BASED_OUTPATIENT_CLINIC_OR_DEPARTMENT_OTHER): Payer: Medicare Other | Attending: General Surgery | Admitting: General Surgery

## 2023-05-31 DIAGNOSIS — M069 Rheumatoid arthritis, unspecified: Secondary | ICD-10-CM | POA: Insufficient documentation

## 2023-05-31 DIAGNOSIS — Z7960 Long term (current) use of unspecified immunomodulators and immunosuppressants: Secondary | ICD-10-CM | POA: Insufficient documentation

## 2023-05-31 DIAGNOSIS — F17210 Nicotine dependence, cigarettes, uncomplicated: Secondary | ICD-10-CM | POA: Insufficient documentation

## 2023-05-31 DIAGNOSIS — L84 Corns and callosities: Secondary | ICD-10-CM | POA: Insufficient documentation

## 2023-05-31 DIAGNOSIS — L97512 Non-pressure chronic ulcer of other part of right foot with fat layer exposed: Secondary | ICD-10-CM | POA: Diagnosis not present

## 2023-05-31 DIAGNOSIS — S91301A Unspecified open wound, right foot, initial encounter: Secondary | ICD-10-CM | POA: Diagnosis not present

## 2023-05-31 NOTE — Progress Notes (Signed)
[name], [name] ([id number]) [id number] Page 1 of 8 Visit Report for 05/31/2023 Chief Complaint Document Details Patient Name: Date of Service: [name], [name] 05/31/2023 1:45 PM Medical Record Number: [id number] Patient Account Number: [id number] Date of Birth/Sex: Treating RN: Nov 20, 1955 (67 y.o. F) Primary Care Provider: Cranford Key Other Key: Referring Provider: Treating Provider/Extender: [name] in Treatment: 4 Information Obtained from: Patient Chief Complaint Patient seen for complaints of Non-Healing Wound. Electronic Signature(s) Signed: 05/31/2023 2:16:39 PM By: Duanne Guess MD FACS Entered By: Duanne Guess on 05/31/2023 11:16:39 -------------------------------------------------------------------------------- Debridement Details Patient Name: Date of Service: Tammy, Key 05/31/2023 1:45 PM Medical Record Number: [id number] Patient Account Number: [id number] Date of Birth/Sex: Treating RN: 09-Feb-1956 (67 y.o. [name] Primary Care Provider: Cranford Key Other Key: Referring Provider: Treating Provider/Extender: [name] in Treatment: 4 Debridement Performed for Assessment: Wound #1 Right T Great oe Performed By: Physician Duanne Guess, MD The following information was scribed by: Redmond Pulling The information was scribed for: Duanne Guess Debridement Type: Debridement Level of Consciousness (Pre-procedure): Awake and Alert Pre-procedure Verification/Time Out Yes - 14:10 Taken: Start Time: 14:12 Pain Control: Lidocaine 5% topical ointment Percent of Wound Bed Debrided: 100% T Area Debrided (cm): otal 0.03 Tissue and other material debrided: Non-Viable, Callus, Slough, Subcutaneous, Slough Level: Skin/Subcutaneous Tissue Debridement Description: Excisional Instrument: Curette Bleeding: Minimum Hemostasis Achieved: Pressure Response  to Treatment: Procedure was tolerated well Level of Consciousness (Post- Awake and Alert procedure): Post Debridement Measurements of Total Wound Length: (cm) 0.2 Width: (cm) 0.2 Depth: (cm) 0.3 Volume: (cm) 0.009 Character of Wound/Ulcer Post Debridement: Improved Post Procedure Diagnosis [name] ([id number]) [id number] Page 2 of 8 Same as Pre-procedure Electronic Signature(s) Signed: 05/31/2023 3:01:49 PM By: Duanne Guess MD FACS Signed: 05/31/2023 3:58:55 PM By: Redmond Pulling RN, BSN Entered By: Redmond Pulling on 05/31/2023 11:14:10 -------------------------------------------------------------------------------- HPI Details Patient Name: Date of Service: [name] 05/31/2023 1:45 PM Medical Record Number: [id number] Patient Account Number: [id number] Date of Birth/Sex: Treating RN: 01-21-1956 (67 y.o. F) Primary Care Provider: Cranford Key Other Key: Referring Provider: Treating Provider/Extender: [name] in Treatment: 4 History of Present Illness HPI Description: ADMISSION ***ABI R: 1.15*** This is a 67 year old woman with a longstanding history of tobacco abuse, smoking a pack per day since the age of 67. She also has rheumatoid arthritis for which she receives immunomodulators. She was referred by podiatry for further evaluation and management of a right great toe ulcer. In May of this year, she had a callus debridement that revealed an open wound underlying the callus. It has apparently been managed with Betadine dressing changes and a surgical shoe for offloading. There was some question that she had peripheral neuropathy, but she underwent EMG testing that was negative for neuropathy and radiculopathy. The neurologist suggested that she undergo vascular studies, but I do not see that these have been performed. Her most recent visit with podiatry was on September 30. The wound still had not  healed as a result, she was referred to the wound care center for further evaluation and treatment. 05/12/2023: No significant change to the wound. There is a fair amount of callus accumulation around it and undermining that is created by her skin. There is slough on the surface. 05/19/2023: There has been no change to her wound. There is still callus buildup around the edges and some slough on the surface. 05/24/2023: No significant change to her wound except that might be  slightly shallower. 05/31/2023: The diameter/circumference of the wound are the same, but I think it is a little bit shallower. Callus continues to accumulate and encroach over the opening to the wound. She continues to smoke. Electronic Signature(s) Signed: 05/31/2023 2:17:25 PM By: Duanne Guess MD FACS Entered By: Duanne Guess on 05/31/2023 11:17:25 -------------------------------------------------------------------------------- Physical Exam Details Patient Name: Date of Service: Tammy, Key 05/31/2023 1:45 PM Medical Record Number: [id number] Patient Account Number: [id number] Date of Birth/Sex: Treating RN: Mar 20, 1956 (67 y.o. F) Primary Care Provider: Cranford Key Other Key: Referring Provider: Treating Provider/Extender: [name] Weeks in Treatment: 4 Constitutional Hypertensive, asymptomatic. . . . no acute distress. Respiratory Normal work of breathing on room air.. Notes 05/31/2023: The diameter/circumference of the wound are the same, but I think it is a little bit shallower. Callus continues to accumulate and encroach over the [place], [name] ([id number]) [id number] Page 3 of 8 opening to the wound. Electronic Signature(s) Signed: 05/31/2023 2:18:26 PM By: Duanne Guess MD FACS Entered By: Duanne Guess on 05/31/2023 11:18:26 -------------------------------------------------------------------------------- Physician Orders  Details Patient Name: Date of Service: Tammy, Key 05/31/2023 1:45 PM Medical Record Number: [id number] Patient Account Number: [id number] Date of Birth/Sex: Treating RN: 1956/02/05 (67 y.o. [name] Primary Care Provider: Cranford Key Other Key: Referring Provider: Treating Provider/Extender: [name] in Treatment: 4 Verbal / Phone Orders: No Diagnosis Coding ICD-10 Coding Code Description [phone] Non-pressure chronic ulcer of other part of right foot with fat layer exposed Z79.69 Long term (current) use of other immunomodulators and immunosuppressants Z72.0 Tobacco use Follow-up Appointments ppointment in 1 week. - Dr. Lady Gary - room 2 Return A Anesthetic (In clinic) Topical Lidocaine 4% applied to wound bed Bathing/ Shower/ Hygiene May shower and wash wound with soap and water. Off-Loading Other: - forefoot offloading shoe to right foot Additional Orders / Instructions Stop/Decrease Smoking Follow Nutritious Diet - 70-100 grams of protein a day, 500 mg of vitamin C once a day and if tolerated then twice a day, zinc 30-50 mg a day Wound Treatment Wound #1 - T Great oe Wound Laterality: Right Cleanser: Soap and Water 1 x Per Day/30 Days Discharge Instructions: May shower and wash wound with dial antibacterial soap and water prior to dressing change. Cleanser: Wound Cleanser 1 x Per Day/30 Days Discharge Instructions: Cleanse the wound with wound cleanser prior to applying a clean dressing using gauze sponges, not tissue or cotton balls. Topical: Gentamicin 1 x Per Day/30 Days Discharge Instructions: As directed by physician Topical: Mupirocin Ointment 1 x Per Day/30 Days Discharge Instructions: Apply Mupirocin (Bactroban) as instructed Prim Dressing: Endoform 2x2 in 1 x Per Day/30 Days ary Discharge Instructions: Moisten with saline Prim Dressing: Optifoam Non-Adhesive Dressing, 4x4 in 1 x Per Day/30 Days ary Discharge  Instructions: Cut out a "donut " shape to cushion Secondary Dressing: Woven Gauze Sponges 2x2 in 1 x Per Day/30 Days Discharge Instructions: Apply over primary dressing as directed. Secured With: Insurance underwriter, Sterile 2x75 (in/in) 1 x Per Day/30 Days Discharge Instructions: Secure with stretch gauze as directed. Secured With: Paper Tape, 2x10 (in/yd) 1 x Per Day/30 Days Discharge Instructions: Secure dressing with tape as directed. [place], [name] ([id number]) [id number] Page 4 of 8 Patient Medications llergies: abatacept, upadacitinib, etanercept, tocilizumab A Notifications Medication Indication Start Key 05/31/2023 lidocaine DOSE topical 5 % ointment - ointment topical once daily Electronic Signature(s) Signed: 05/31/2023 3:01:49 PM By: Duanne Guess MD FACS Entered By: Duanne Guess on 05/31/2023  11:19:00 -------------------------------------------------------------------------------- Problem List Details Patient Name: Date of Service: Tammy, Key 05/31/2023 1:45 PM Medical Record Number: [id number] Patient Account Number: [id number] Date of Birth/Sex: Treating RN: July 12, 1956 (67 y.o. F) Primary Care Provider: Cranford Key Other Key: Referring Provider: Treating Provider/Extender: [name] in Treatment: 4 Active Problems ICD-10 Encounter Code Description Active Date MDM Diagnosis L97.512 Non-pressure chronic ulcer of other part of right foot with fat layer exposed 05/02/2023 No Yes Z79.69 Long term (current) use of other immunomodulators and immunosuppressants 05/02/2023 No Yes Z72.0 Tobacco use 05/02/2023 No Yes Inactive Problems Resolved Problems Electronic Signature(s) Signed: 05/31/2023 2:15:48 PM By: Duanne Guess MD FACS Entered By: Duanne Guess on 05/31/2023 11:15:48 -------------------------------------------------------------------------------- Progress Note  Details Patient Name: Date of Service: Tammy, Key 05/31/2023 1:45 PM Medical Record Number: [id number] Patient Account Number: [id number] Date of Birth/Sex: Treating RN: 05-10-1956 (67 y.o. F) Primary Care Provider: Cranford Key Other Key: Referring Provider: Treating Provider/Extender: [name], [name], [name] ([id number]) [id number] Page 5 of 8 Weeks in Treatment: 4 Subjective Chief Complaint Information obtained from Patient Patient seen for complaints of Non-Healing Wound. History of Present Illness (HPI) ADMISSION ***ABI R: 1.15*** This is a 67 year old woman with a longstanding history of tobacco abuse, smoking a pack per day since the age of 29. She also has rheumatoid arthritis for which she receives immunomodulators. She was referred by podiatry for further evaluation and management of a right great toe ulcer. In May of this year, she had a callus debridement that revealed an open wound underlying the callus. It has apparently been managed with Betadine dressing changes and a surgical shoe for offloading. There was some question that she had peripheral neuropathy, but she underwent EMG testing that was negative for neuropathy and radiculopathy. The neurologist suggested that she undergo vascular studies, but I do not see that these have been performed. Her most recent visit with podiatry was on September 30. The wound still had not healed as a result, she was referred to the wound care center for further evaluation and treatment. 05/12/2023: No significant change to the wound. There is a fair amount of callus accumulation around it and undermining that is created by her skin. There is slough on the surface. 05/19/2023: There has been no change to her wound. There is still callus buildup around the edges and some slough on the surface. 05/24/2023: No significant change to her wound except that might be slightly  shallower. 05/31/2023: The diameter/circumference of the wound are the same, but I think it is a little bit shallower. Callus continues to accumulate and encroach over the opening to the wound. She continues to smoke. Patient History Information obtained from Patient, Chart. Family History Diabetes - Mother, Heart Disease - Father, Hypertension - Father, Key Disease - Father, Thyroid Problems - Father, No family history of Cancer, Hereditary Spherocytosis, Lung Disease, Seizures, Stroke, Tuberculosis. Social History Current every day smoker - 1 ppd, Marital Status - Married, Alcohol Use - Never, Drug Use - No History, Caffeine Use - Daily. Medical History Cardiovascular Patient has history of Hypertension Endocrine Denies history of Type II Diabetes Musculoskeletal Patient has history of Rheumatoid Arthritis Hospitalization/Surgery History - Colonoscopy. - Cesarean section. - Tubal ligation. Medical A Surgical History Notes nd Gastrointestinal colon polyps Integumentary (Skin) viral warts Objective Constitutional Hypertensive, asymptomatic. no acute distress. Vitals Time Taken: 1:45 AM, Height: 66 in, Weight: 158 lbs, BMI: 25.5, Temperature: 97.5 F, Pulse: 83 bpm, Respiratory Rate: 18 breaths/min, Blood  Pressure: 154/93 mmHg. Respiratory Normal work of breathing on room air.. General Notes: 05/31/2023: The diameter/circumference of the wound are the same, but I think it is a little bit shallower. Callus continues to accumulate and encroach over the opening to the wound. Integumentary (Hair, Skin) Wound #1 status is Open. Original cause of wound was Gradually Appeared. The date acquired was: 12/07/2022. The wound has been in treatment 4 weeks. The wound is located on the Right T Great. The wound measures 0.2cm length x 0.2cm width x 0.3cm depth; 0.031cm^2 area and 0.009cm^3 volume. There is Fat oe Layer (Subcutaneous Tissue) exposed. There is no tunneling noted, however, there is  undermining starting at 12:00 and ending at 12:00 with a maximum distance of 0.2cm. There is a medium amount of serosanguineous drainage noted. The wound margin is distinct with the outline attached to the wound base. There is large (67-100%) red, pink granulation within the wound bed. There is a small (1-33%) amount of necrotic tissue within the wound bed including Adherent Slough. The periwound skin appearance had no abnormalities noted for moisture. The periwound skin appearance had no abnormalities noted for color. [facility] [place], [place] ([id number]) [id number] Page 6 of 8 periwound skin appearance exhibited: Callus. Periwound temperature was noted as No Abnormality. Assessment Active Problems ICD-10 Non-pressure chronic ulcer of other part of right foot with fat layer exposed Long term (current) use of other immunomodulators and immunosuppressants Tobacco use Procedures Wound #1 Pre-procedure diagnosis of Wound #1 is an Atypical located on the Right T Great . There was a Excisional Skin/Subcutaneous Tissue Debridement with a total oe area of 0.03 sq cm performed by Duanne Guess, MD. With the following instrument(s): Curette to remove Non-Viable tissue/material. Material removed includes Callus, Subcutaneous Tissue, and Slough after achieving pain control using Lidocaine 5% topical ointment. No specimens were taken. A time out was conducted at 14:10, prior to the start of the procedure. A Minimum amount of bleeding was controlled with Pressure. The procedure was tolerated well. Post Debridement Measurements: 0.2cm length x 0.2cm width x 0.3cm depth; 0.009cm^3 volume. Character of Wound/Ulcer Post Debridement is improved. Post procedure Diagnosis Wound #1: Same as Pre-Procedure Plan Follow-up Appointments: Return Appointment in 1 week. - Dr. Lady Gary - room 2 Anesthetic: (In clinic) Topical Lidocaine 4% applied to wound bed Bathing/ Shower/ Hygiene: May  shower and wash wound with soap and water. Off-Loading: Other: - forefoot offloading shoe to right foot Additional Orders / Instructions: Stop/Decrease Smoking Follow Nutritious Diet - 70-100 grams of protein a day, 500 mg of vitamin C once a day and if tolerated then twice a day, zinc 30-50 mg a day The following medication(s) was prescribed: lidocaine topical 5 % ointment ointment topical once daily was prescribed at facility WOUND #1: - T Great Wound Laterality: Right oe Cleanser: Soap and Water 1 x Per Day/30 Days Discharge Instructions: May shower and wash wound with dial antibacterial soap and water prior to dressing change. Cleanser: Wound Cleanser 1 x Per Day/30 Days Discharge Instructions: Cleanse the wound with wound cleanser prior to applying a clean dressing using gauze sponges, not tissue or cotton balls. Topical: Gentamicin 1 x Per Day/30 Days Discharge Instructions: As directed by physician Topical: Mupirocin Ointment 1 x Per Day/30 Days Discharge Instructions: Apply Mupirocin (Bactroban) as instructed Prim Dressing: Endoform 2x2 in 1 x Per Day/30 Days ary Discharge Instructions: Moisten with saline Prim Dressing: Optifoam Non-Adhesive Dressing, 4x4 in 1 x Per Day/30 Days ary Discharge Instructions: Cut out a "donut " shape  to cushion Secondary Dressing: Woven Gauze Sponges 2x2 in 1 x Per Day/30 Days Discharge Instructions: Apply over primary dressing as directed. Secured With: Insurance underwriter, Sterile 2x75 (in/in) 1 x Per Day/30 Days Discharge Instructions: Secure with stretch gauze as directed. Secured With: Paper T ape, 2x10 (in/yd) 1 x Per Day/30 Days Discharge Instructions: Secure dressing with tape as directed. 05/31/2023: The diameter/circumference of the wound are the same, but I think it is a little bit shallower. Callus continues to accumulate and encroach over the opening to the wound. She continues to smoke. I used a curette debride callus,  slough, and subcutaneous tissue from the wound. I am going to change the contact layer to endoform to see if this makes any difference. Continue the topical gentamicin and mupirocin. She is wearing her forefoot offloading shoe. She was reminded of the need to stop using nicotine in any form. We also gave her some Juven samples to try. She will follow-up in 1 week. Electronic Signature(s) Signed: 05/31/2023 2:19:52 PM By: Duanne Guess MD FACS Entered By: Duanne Guess on 05/31/2023 11:19:52 [name] ([id number]) [id number] Page 7 of 8 -------------------------------------------------------------------------------- HxROS Details Patient Name: Date of Service: JOVANI, COLQUHOUN 05/31/2023 1:45 PM Medical Record Number: [id number] Patient Account Number: [id number] Date of Birth/Sex: Treating RN: 1956/07/23 (67 y.o. F) Primary Care Provider: Cranford Key Other Key: Referring Provider: Treating Provider/Extender: [name] in Treatment: 4 Information Obtained From Patient Chart Cardiovascular Medical History: Positive for: Hypertension Gastrointestinal Medical History: Past Medical History Notes: colon polyps Endocrine Medical History: Negative for: Type II Diabetes Integumentary (Skin) Medical History: Past Medical History Notes: viral warts Musculoskeletal Medical History: Positive for: Rheumatoid Arthritis Immunizations Pneumococcal Vaccine: Received Pneumococcal Vaccination: Yes Received Pneumococcal Vaccination On or After 60th Birthday: Yes Implantable Devices None Hospitalization / Surgery History Type of Hospitalization/Surgery Colonoscopy Cesarean section Tubal ligation Family and Social History Cancer: No; Diabetes: Yes - Mother; Heart Disease: Yes - Father; Hereditary Spherocytosis: No; Hypertension: Yes - Father; Key Disease: Yes - Father; Lung Disease: No; Seizures: No; Stroke: No;  Thyroid Problems: Yes - Father; Tuberculosis: No; Current every day smoker - 1 ppd; Marital Status - Married; Alcohol Use: Never; Drug Use: No History; Caffeine Use: Daily; Financial Concerns: No; Food, Clothing or Shelter Needs: No; Support System Lacking: No; Transportation Concerns: No Electronic Signature(s) Signed: 05/31/2023 3:01:49 PM By: Duanne Guess MD FACS Entered By: Duanne Guess on 05/31/2023 11:17:53 [name] ([id number]) [id number] Page 8 of 8 -------------------------------------------------------------------------------- SuperBill Details Patient Name: Date of Service: ADAORA, MCHANEY 05/31/2023 Medical Record Number: [id number] Patient Account Number: [id number] Date of Birth/Sex: Treating RN: 07-20-1956 (67 y.o. F) Primary Care Provider: Cranford Key Other Key: Referring Provider: Treating Provider/Extender: [name] in Treatment: 4 Diagnosis Coding ICD-10 Codes Code Description [phone] Non-pressure chronic ulcer of other part of right foot with fat layer exposed Z79.69 Long term (current) use of other immunomodulators and immunosuppressants Z72.0 Tobacco use Facility Procedures : CPT4 Code: [id number] Description: 11042 - DEB SUBQ TISSUE 20 SQ CM/< ICD-10 Diagnosis Description L97.512 Non-pressure chronic ulcer of other part of right foot with fat layer exposed Modifier: Quantity: 1 Physician Procedures : CPT4 Code Description Modifier [id number] - WC PHYS LEVEL 4 - EST PT ICD-10 Diagnosis Description L97.512 Non-pressure chronic ulcer of other part of right foot with fat layer exposed Z79.69 Long term (current) use of other immunomodulators and  immunosuppressants Z72.0 Tobacco use Quantity: 1 : [id number] 11042 - WC  PHYS SUBQ TISS 20 SQ CM ICD-10 Diagnosis Description L97.512 Non-pressure chronic ulcer of other part of right foot with fat layer exposed Quantity: 1 Electronic  Signature(s) Signed: 05/31/2023 2:20:06 PM By: Duanne Guess MD FACS Entered By: Duanne Guess on 05/31/2023 11:20:05

## 2023-05-31 NOTE — Progress Notes (Signed)
Waucoma, [name] Key ([id number]) [id number] Page 1 of 7 Visit Report for 05/31/2023 Arrival Information Details Patient Name: Date of Service: [name] Key, [name] Key 05/31/2023 1:45 PM Medical Record Number: [id number] Patient Account Number: [id number] Date of Birth/Sex: Treating RN: 07/14/1956 (67 y.o. F) Primary Care Tyrease Vandeberg: [place] Mon Other Clinician: Referring Briawna Carver: Treating Jontavius Rabalais/Extender: Cassie Freer in Treatment: 4 Visit Information History Since Last Visit Added or deleted any medications: No Patient Arrived: Ambulatory Any new allergies or adverse reactions: No Arrival Time: 13:45 Had a fall or experienced change in No Accompanied By: self activities of daily living that may affect Transfer Assistance: None risk of falls: Patient Identification Verified: Yes Signs or symptoms of abuse/neglect since last visito No Secondary Verification Process Completed: Yes Hospitalized since last visit: No Implantable device outside of the clinic excluding No cellular tissue based products placed in the center since last visit: Pain Present Now: No Electronic Signature(s) Signed: 05/31/2023 3:52:08 PM By: Dayton Scrape Entered By: Dayton Scrape on 05/31/2023 10:45:38 -------------------------------------------------------------------------------- Encounter Discharge Information Details Patient Name: Date of Service: [name], Key 05/31/2023 1:45 PM Medical Record Number: [id number] Patient Account Number: [id number] Date of Birth/Sex: Treating RN: 03-15-1956 (67 y.o. Orville Govern Primary Care Daeja Helderman: [place] Mon Other Clinician: Referring Nathon Stefanski: Treating Jahking Lesser/Extender: Cassie Freer in Treatment: 4 Encounter Discharge Information Items Post Procedure Vitals Discharge Condition: Stable Temperature (F): 97.5 Ambulatory Status: Ambulatory Pulse (bpm): 83 Discharge Destination:  Home Respiratory Rate (breaths/min): 18 Transportation: Private Auto Blood Pressure (mmHg): 154/93 Accompanied By: self Schedule Follow-up Appointment: Yes Clinical Summary of Care: Patient Declined Electronic Signature(s) Signed: 05/31/2023 3:58:55 PM By: Redmond Pulling RN, BSN Entered By: Redmond Pulling on 05/31/2023 11:57:07 [name] ([id number]) [id number] Page 2 of 7 -------------------------------------------------------------------------------- Lower Extremity Assessment Details Patient Name: Date of Service: [name], Key 05/31/2023 1:45 PM Medical Record Number: [id number] Patient Account Number: [id number] Date of Birth/Sex: Treating RN: [birth year] (67 y.o. Orville Govern Primary Care Rhealynn Myhre: [place] Mon Other Clinician: Referring Jahniyah Revere: Treating Nikki Rusnak/Extender: Eloy End Weeks in Treatment: 4 Edema Assessment Assessed: [Left: No] [Right: No] [Left: Edema] [Right: :] Calf Left: Right: Point of Measurement: From Medial Instep 37.8 cm Ankle Left: Right: Point of Measurement: From Medial Instep 22 cm Vascular Assessment Pulses: Dorsalis Pedis Palpable: [Right:Yes] Extremity colors, hair growth, and conditions: Extremity Color: [Right:Normal] Hair Growth on Extremity: [Right:Yes] Temperature of Extremity: [Right:Warm] Capillary Refill: [Right:< 3 seconds] Dependent Rubor: [Right:No No] Electronic Signature(s) Signed: 05/31/2023 3:58:55 PM By: Redmond Pulling RN, BSN Entered By: Redmond Pulling on 05/31/2023 11:03:50 -------------------------------------------------------------------------------- Multi Wound Chart Details Patient Name: Date of Service: [name], Key 05/31/2023 1:45 PM Medical Record Number: [id number] Patient Account Number: [id number] Date of Birth/Sex: Treating RN: 07/06/1956 (66 y.o. F) Primary Care Quintin Hjort: [place] Mon Other Clinician: Referring Carnetta Losada: Treating  Terrie Grajales/Extender: Cassie Freer in Treatment: 4 Vital Signs Height(in): 66 Pulse(bpm): 83 Weight(lbs): 158 Blood Pressure(mmHg): 154/93 Body Mass Index(BMI): 25.5 Temperature(F): 97.5 Respiratory Rate(breaths/min): 18 [1:Photos:] [N/A:N/A] Right T Great oe N/A N/A Wound Location: Gradually Appeared N/A N/A Wounding Event: Atypical N/A N/A Primary Etiology: Hypertension, Rheumatoid Arthritis N/A N/A Comorbid History: 12/07/2022 N/A N/A Date Acquired: 4 N/A N/A Weeks of Treatment: Open N/A N/A Wound Status: No N/A N/A Wound Recurrence: 0.2x0.2x0.3 N/A N/A Measurements L x W x D (cm) 0.031 N/A N/A A (cm) : rea 0.009 N/A N/A Volume (cm) : 56.30% N/A N/A % Reduction in A rea: 35.70% N/A N/A %  Reduction in Volume: 12 Starting Position 1 (o'clock): 12 Ending Position 1 (o'clock): 0.2 Maximum Distance 1 (cm): Yes N/A N/A Undermining: Full Thickness Without Exposed N/A N/A Classification: Support Structures Medium N/A N/A Exudate A mount: Serosanguineous N/A N/A Exudate Type: red, brown N/A N/A Exudate Color: Distinct, outline attached N/A N/A Wound Margin: Large (67-100%) N/A N/A Granulation A mount: Red, Pink N/A N/A Granulation Quality: Small (1-33%) N/A N/A Necrotic A mount: Fat Layer (Subcutaneous Tissue): Yes N/A N/A Exposed Structures: Fascia: No Tendon: No Muscle: No Joint: No Bone: No Small (1-33%) N/A N/A Epithelialization: Debridement - Excisional N/A N/A Debridement: Pre-procedure Verification/Time Out 14:10 N/A N/A Taken: Lidocaine 5% topical ointment N/A N/A Pain Control: Callus, Subcutaneous, Slough N/A N/A Tissue Debrided: Skin/Subcutaneous Tissue N/A N/A Level: 0.03 N/A N/A Debridement A (sq cm): rea Curette N/A N/A Instrument: Minimum N/A N/A Bleeding: Pressure N/A N/A Hemostasis A chieved: Procedure was tolerated well N/A N/A Debridement Treatment Response: 0.2x0.2x0.3 N/A N/A Post  Debridement Measurements L x W x D (cm) 0.009 N/A N/A Post Debridement Volume: (cm) Callus: Yes N/A N/A Periwound Skin Texture: No Abnormalities Noted N/A N/A Periwound Skin Moisture: No Abnormalities Noted N/A N/A Periwound Skin Color: No Abnormality N/A N/A Temperature: Debridement N/A N/A Procedures Performed: Treatment Notes Electronic Signature(s) Signed: 05/31/2023 2:15:56 PM By: Duanne Guess MD FACS Entered By: Duanne Guess on 05/31/2023 11:15:56 -------------------------------------------------------------------------------- Multi-Disciplinary Care Plan Details Patient Name: Date of Service: [name], Key 05/31/2023 1:45 PM Medical Record Number: [id number] Patient Account Number: [id number] Date of Birth/Sex: Treating RN: 1955-09-08 (67 y.o. Orville Govern Primary Care Zanetta Dehaan: [place] Mon Other Clinician: [name] ([id number]) [id number] Page 4 of 7 Referring Vyncent Overby: Treating Amar Keenum/Extender: Cassie Freer in Treatment: 4 Active Inactive Orientation to the Wound Care Program Nursing Diagnoses: Knowledge deficit related to the wound healing center program Goals: Patient/caregiver will verbalize understanding of the Wound Healing Center Program Date Initiated: 05/02/2023 Target Resolution Date: 07/02/2023 Goal Status: Active Interventions: Provide education on orientation to the wound center Notes: Wound/Skin Impairment Nursing Diagnoses: Impaired tissue integrity Knowledge deficit related to smoking impact on wound healing Knowledge deficit related to ulceration/compromised skin integrity Goals: Patient will demonstrate a reduced rate of smoking or cessation of smoking Date Initiated: 05/02/2023 Target Resolution Date: 07/02/2023 Goal Status: Active Patient/caregiver will verbalize understanding of skin care regimen Date Initiated: 05/02/2023 Target Resolution Date: 07/02/2023 Goal  Status: Active Interventions: Assess patient/caregiver ability to obtain necessary supplies Assess patient/caregiver ability to perform ulcer/skin care regimen upon admission and as needed Assess ulceration(s) every visit Provide education on smoking Treatment Activities: Referred to DME Saladin Petrelli for dressing supplies : 05/02/2023 Skin care regimen initiated : 05/02/2023 Topical wound management initiated : 05/02/2023 Notes: Electronic Signature(s) Signed: 05/31/2023 3:58:55 PM By: Redmond Pulling RN, BSN Entered By: Redmond Pulling on 05/31/2023 11:12:01 -------------------------------------------------------------------------------- Pain Assessment Details Patient Name: Date of Service: [name], Key 05/31/2023 1:45 PM Medical Record Number: [id number] Patient Account Number: [id number] Date of Birth/Sex: Treating RN: 12-05-1955 (67 y.o. F) Primary Care Jannelle Notaro: [place] Mon Other Clinician: Referring Anokhi Shannon: Treating Zeniya Lapidus/Extender: Cassie Freer in Treatment: 4 Active Problems Location of Pain Severity and Description of Pain Patient Has Paino No Site Locations [place], [place] ([id number]) [id number] Page 5 of 7 Pain Management and Medication Current Pain Management: Electronic Signature(s) Signed: 05/31/2023 3:52:08 PM By: Dayton Scrape Entered By: Dayton Scrape on 05/31/2023 10:46:09 -------------------------------------------------------------------------------- Patient/Caregiver Education Details Patient Name: Date of Service: [name] 11/6/2024andnbsp1:45 PM Medical Record Number:  [id number] Patient Account Number: [id number] Date of Birth/Gender: Treating RN: [birth year] (67 y.o. Orville Govern Primary Care Physician: [place] Mon Other Clinician: Referring Physician: Treating Physician/Extender: Cassie Freer in Treatment: 4 Education Assessment Education Provided  To: Patient Education Topics Provided Wound/Skin Impairment: Methods: Explain/Verbal Responses: State content correctly Electronic Signature(s) Signed: 05/31/2023 3:58:55 PM By: Redmond Pulling RN, BSN Entered By: Redmond Pulling on 05/31/2023 11:12:24 -------------------------------------------------------------------------------- Wound Assessment Details Patient Name: Date of Service: [name], Key 05/31/2023 1:45 PM Medical Record Number: [id number] Patient Account Number: [id number] Date of Birth/Sex: Treating RN: 1956-03-07 (67 y.o. F) Primary Care Jaryn Hocutt: [place] Mon Other Clinician: [name] ([id number]) [id number] Page 6 of 7 Referring Mirta Mally: Treating Devrin Monforte/Extender: Eloy End Weeks in Treatment: 4 Wound Status Wound Number: 1 Primary Etiology: Atypical Wound Location: Right T Great oe Wound Status: Open Wounding Event: Gradually Appeared Comorbid History: Hypertension, Rheumatoid Arthritis Date Acquired: 12/07/2022 Weeks Of Treatment: 4 Clustered Wound: No Photos Wound Measurements Length: (cm) 0.2 Width: (cm) 0.2 Depth: (cm) 0.3 Area: (cm) 0.031 Volume: (cm) 0.009 % Reduction in Area: 56.3% % Reduction in Volume: 35.7% Epithelialization: Small (1-33%) Tunneling: No Undermining: Yes Starting Position (o'clock): 12 Ending Position (o'clock): 12 Maximum Distance: (cm) 0.2 Wound Description Classification: Full Thickness Without Exposed Support Structures Wound Margin: Distinct, outline attached Exudate Amount: Medium Exudate Type: Serosanguineous Exudate Color: red, brown Foul Odor After Cleansing: No Slough/Fibrino Yes Wound Bed Granulation Amount: Large (67-100%) Exposed Structure Granulation Quality: Red, Pink Fascia Exposed: No Necrotic Amount: Small (1-33%) Fat Layer (Subcutaneous Tissue) Exposed: Yes Necrotic Quality: Adherent Slough Tendon Exposed: No Muscle Exposed: No Joint  Exposed: No Bone Exposed: No Periwound Skin Texture Texture Color No Abnormalities Noted: No No Abnormalities Noted: Yes Callus: Yes Temperature / Pain Temperature: No Abnormality Moisture No Abnormalities Noted: Yes Treatment Notes Wound #1 (Toe Great) Wound Laterality: Right Cleanser Soap and Water Discharge Instruction: May shower and wash wound with dial antibacterial soap and water prior to dressing change. Wound Cleanser Discharge Instruction: Cleanse the wound with wound cleanser prior to applying a clean dressing using gauze sponges, not tissue or cotton balls. Peri-Wound Care Topical Gentamicin [place], [name] Key ([id number]) [id number] Page 7 of 7 Discharge Instruction: As directed by physician Mupirocin Ointment Discharge Instruction: Apply Mupirocin (Bactroban) as instructed Primary Dressing Endoform 2x2 in Discharge Instruction: Moisten with saline Optifoam Non-Adhesive Dressing, 4x4 in Discharge Instruction: Cut out a "donut " shape to cushion Secondary Dressing Woven Gauze Sponges 2x2 in Discharge Instruction: Apply over primary dressing as directed. Secured With Conforming Stretch Gauze Bandage, Sterile 2x75 (in/in) Discharge Instruction: Secure with stretch gauze as directed. Paper Tape, 2x10 (in/yd) Discharge Instruction: Secure dressing with tape as directed. Compression Wrap Compression Stockings Add-Ons Electronic Signature(s) Signed: 05/31/2023 3:58:55 PM By: Redmond Pulling RN, BSN Entered By: Redmond Pulling on 05/31/2023 11:05:29 -------------------------------------------------------------------------------- Vitals Details Patient Name: Date of Service: [name], Key 05/31/2023 1:45 PM Medical Record Number: [id number] Patient Account Number: [id number] Date of Birth/Sex: Treating RN: 09/13/1955 (67 y.o. F) Primary Care Samiyah Stupka: [place] Mon Other Clinician: Referring Aleecia Tapia: Treating Lyndel Dancel/Extender: Cassie Freer in Treatment: 4 Vital Signs Time Taken: 01:45 Temperature (F): 97.5 Height (in): 66 Pulse (bpm): 83 Weight (lbs): 158 Respiratory Rate (breaths/min): 18 Body Mass Index (BMI): 25.5 Blood Pressure (mmHg): 154/93 Reference Range: 80 - 120 mg / dl Electronic Signature(s) Signed: 05/31/2023 3:52:08 PM By: Dayton Scrape Entered By: Dayton Scrape on 05/31/2023 10:46:01

## 2023-06-08 ENCOUNTER — Encounter (HOSPITAL_BASED_OUTPATIENT_CLINIC_OR_DEPARTMENT_OTHER): Payer: Medicare Other | Admitting: General Surgery

## 2023-06-08 DIAGNOSIS — S91101A Unspecified open wound of right great toe without damage to nail, initial encounter: Secondary | ICD-10-CM | POA: Diagnosis not present

## 2023-06-08 DIAGNOSIS — F17210 Nicotine dependence, cigarettes, uncomplicated: Secondary | ICD-10-CM | POA: Diagnosis not present

## 2023-06-08 DIAGNOSIS — M069 Rheumatoid arthritis, unspecified: Secondary | ICD-10-CM | POA: Diagnosis not present

## 2023-06-08 DIAGNOSIS — Z7960 Long term (current) use of unspecified immunomodulators and immunosuppressants: Secondary | ICD-10-CM | POA: Diagnosis not present

## 2023-06-08 DIAGNOSIS — L97512 Non-pressure chronic ulcer of other part of right foot with fat layer exposed: Secondary | ICD-10-CM | POA: Diagnosis not present

## 2023-06-08 DIAGNOSIS — L84 Corns and callosities: Secondary | ICD-10-CM | POA: Diagnosis not present

## 2023-06-12 DIAGNOSIS — I10 Essential (primary) hypertension: Secondary | ICD-10-CM | POA: Diagnosis not present

## 2023-06-12 DIAGNOSIS — Z79899 Other long term (current) drug therapy: Secondary | ICD-10-CM | POA: Diagnosis not present

## 2023-06-12 DIAGNOSIS — E6609 Other obesity due to excess calories: Secondary | ICD-10-CM | POA: Diagnosis not present

## 2023-06-12 DIAGNOSIS — M069 Rheumatoid arthritis, unspecified: Secondary | ICD-10-CM | POA: Diagnosis not present

## 2023-06-12 NOTE — Progress Notes (Signed)
Orient, [name] ([id number]) [id number] Page 1 of 8 Visit Report for 06/08/2023 Arrival Information Details Patient Name: Date of Service: [name], [name] 06/08/2023 1:45 PM Medical Record Number: [id number] Patient Account Number: [id number] Date of Birth/Sex: Treating RN: 11-19-1955 (67 y.o. Tammy Govern Primary Care Callahan Wild: [place] Mon Other Clinician: Referring Elliott Lasecki: Treating Melainie Krinsky/Extender: Cassie Freer in Treatment: 5 Visit Information History Since Last Visit Added or deleted any medications: No Patient Arrived: Ambulatory Any new allergies or adverse reactions: No Arrival Time: 13:47 Had a fall or experienced change in No Accompanied By: self activities of daily living that may affect Transfer Assistance: None risk of falls: Patient Identification Verified: Yes Signs or symptoms of abuse/neglect since last visito No Secondary Verification Process Completed: Yes Hospitalized since last visit: No Implantable device outside of the clinic excluding No cellular tissue based products placed in the center since last visit: Has Dressing in Place as Prescribed: Yes Has Footwear/Offloading in Place as Prescribed: Yes Right: Wedge Shoe Pain Present Now: No Electronic Signature(s) Signed: 06/12/2023 4:36:03 PM By: Redmond Pulling RN, BSN Entered By: Redmond Pulling on 06/08/2023 11:21:03 -------------------------------------------------------------------------------- Encounter Discharge Information Details Patient Name: Date of Service: [name], [name] 06/08/2023 1:45 PM Medical Record Number: [id number] Patient Account Number: [id number] Date of Birth/Sex: Treating RN: 1955-11-11 (67 y.o. Tammy Govern Primary Care Jaionna Weisse: [place] Mon Other Clinician: Referring Shuaib Corsino: Treating Ruchama Kubicek/Extender: Cassie Freer in Treatment: 5 Encounter Discharge Information Items Post  Procedure Vitals Discharge Condition: Stable Temperature (F): 97.9 Ambulatory Status: Ambulatory Pulse (bpm): 89 Discharge Destination: Home Respiratory Rate (breaths/min): 18 Transportation: Private Auto Blood Pressure (mmHg): 151/92 Accompanied By: self Schedule Follow-up Appointment: Yes Clinical Summary of Care: Patient Declined Electronic Signature(s) Signed: 06/12/2023 4:36:03 PM By: Redmond Pulling RN, BSN Entered By: Redmond Pulling on 06/08/2023 11:20:40 [name] ([id number]) [phone].pdf Page 2 of 8 -------------------------------------------------------------------------------- Lower Extremity Assessment Details Patient Name: Date of Service: [name], [name] 06/08/2023 1:45 PM Medical Record Number: [id number] Patient Account Number: [id number] Date of Birth/Sex: Treating RN: 1956-05-13 (67 y.o. Tammy Govern Primary Care Nabiha Planck: [place] Mon Other Clinician: Referring Jinnie Onley: Treating Danissa Rundle/Extender: Cassie Freer in Treatment: 5 Edema Assessment Assessed: [Left: No] [Right: No] [Left: Edema] [Right: :] Calf Left: Right: Point of Measurement: From Medial Instep 37 cm Ankle Left: Right: Point of Measurement: From Medial Instep 22.5 cm Vascular Assessment Pulses: Dorsalis Pedis Palpable: [Right:Yes] Extremity colors, hair growth, and conditions: Extremity Color: [Right:Normal] Hair Growth on Extremity: [Right:Yes] Temperature of Extremity: [Right:Warm] Capillary Refill: [Right:< 3 seconds] Dependent Rubor: [Right:No No] Electronic Signature(s) Signed: 06/12/2023 4:36:03 PM By: Redmond Pulling RN, BSN Entered By: Redmond Pulling on 06/08/2023 10:50:04 -------------------------------------------------------------------------------- Multi Wound Chart Details Patient Name: Date of Service: [name], [name] 06/08/2023 1:45 PM Medical Record Number: [id number] Patient Account Number:  [id number] Date of Birth/Sex: Treating RN: May 05, 1956 (67 y.o. F) Primary Care Deretha Ertle: [place] Mon Other Clinician: Referring Lamonda Noxon: Treating Najma Bozarth/Extender: Cassie Freer in Treatment: 5 Vital Signs Height(in): 66 Pulse(bpm): 89 Weight(lbs): 158 Blood Pressure(mmHg): 151/92 Body Mass Index(BMI): 25.5 Temperature(F): 97.9 Respiratory Rate(breaths/min): 18 [1:Photos:] [N/A:N/A] Right T Great oe N/A N/A Wound Location: Gradually Appeared N/A N/A Wounding Event: Atypical N/A N/A Primary Etiology: Hypertension, Rheumatoid Arthritis N/A N/A Comorbid History: 12/07/2022 N/A N/A Date Acquired: 5 N/A N/A Weeks of Treatment: Open N/A N/A Wound Status: No N/A N/A Wound Recurrence: 0.2x0.2x0.2 N/A N/A Measurements L x W x D (cm) 0.031 N/A N/A A (  cm) : rea 0.006 N/A N/A Volume (cm) : 56.30% N/A N/A % Reduction in A rea: 57.10% N/A N/A % Reduction in Volume: Full Thickness Without Exposed N/A N/A Classification: Support Structures Medium N/A N/A Exudate A mount: Serosanguineous N/A N/A Exudate Type: red, brown N/A N/A Exudate Color: Distinct, outline attached N/A N/A Wound Margin: Large (67-100%) N/A N/A Granulation A mount: Red, Pink N/A N/A Granulation Quality: Small (1-33%) N/A N/A Necrotic A mount: Fat Layer (Subcutaneous Tissue): Yes N/A N/A Exposed Structures: Fascia: No Tendon: No Muscle: No Joint: No Bone: No Small (1-33%) N/A N/A Epithelialization: Debridement - Excisional N/A N/A Debridement: Pre-procedure Verification/Time Out 14:05 N/A N/A Taken: Lidocaine 4% Topical Solution N/A N/A Pain Control: Callus, Subcutaneous, Slough N/A N/A Tissue Debrided: Skin/Subcutaneous Tissue N/A N/A Level: 0.03 N/A N/A Debridement A (sq cm): rea Curette N/A N/A Instrument: Minimum N/A N/A Bleeding: Pressure N/A N/A Hemostasis A chieved: Procedure was tolerated well N/A N/A Debridement Treatment Response: 0.2x0.2x0.2  N/A N/A Post Debridement Measurements L x W x D (cm) 0.006 N/A N/A Post Debridement Volume: (cm) Callus: Yes N/A N/A Periwound Skin Texture: No Abnormalities Noted N/A N/A Periwound Skin Moisture: No Abnormalities Noted N/A N/A Periwound Skin Color: No Abnormality N/A N/A Temperature: Debridement N/A N/A Procedures Performed: Treatment Notes Wound #1 (Toe Great) Wound Laterality: Right Cleanser Soap and Water Discharge Instruction: May shower and wash wound with dial antibacterial soap and water prior to dressing change. Wound Cleanser Discharge Instruction: Cleanse the wound with wound cleanser prior to applying a clean dressing using gauze sponges, not tissue or cotton balls. Peri-Wound Care Topical Gentamicin Discharge Instruction: As directed by physician Mupirocin Ointment Discharge Instruction: Apply Mupirocin (Bactroban) as instructed Primary Dressing Endoform 2x2 in Discharge Instruction: Moisten with saline Optifoam Non-Adhesive Dressing, 4x4 in Discharge Instruction: Cut out a "donut " shape to cushion Secondary Dressing Woven Gauze Sponges 2x2 in [place], [name] ([id number]) [phone].pdf Page 4 of 8 Discharge Instruction: Apply over primary dressing as directed. Secured With Conforming Stretch Gauze Bandage, Sterile 2x75 (in/in) Discharge Instruction: Secure with stretch gauze as directed. Paper Tape, 2x10 (in/yd) Discharge Instruction: Secure dressing with tape as directed. Compression Wrap Compression Stockings Add-Ons Electronic Signature(s) Signed: 06/08/2023 3:14:27 PM By: Duanne Guess MD FACS Previous Signature: 06/08/2023 2:47:32 PM Version By: Duanne Guess MD FACS Entered By: Duanne Guess on 06/08/2023 12:14:27 -------------------------------------------------------------------------------- Multi-Disciplinary Care Plan Details Patient Name: Date of Service: [name], [name] 06/08/2023 1:45 PM Medical  Record Number: [id number] Patient Account Number: [id number] Date of Birth/Sex: Treating RN: 10-19-1955 (67 y.o. Tammy Govern Primary Care Amyrah Pinkhasov: [place] Mon Other Clinician: Referring Ayaz Sondgeroth: Treating Cebert Dettmann/Extender: Cassie Freer in Treatment: 5 Active Inactive Orientation to the Wound Care Program Nursing Diagnoses: Knowledge deficit related to the wound healing center program Goals: Patient/caregiver will verbalize understanding of the Wound Healing Center Program Date Initiated: 05/02/2023 Target Resolution Date: 07/02/2023 Goal Status: Active Interventions: Provide education on orientation to the wound center Notes: Wound/Skin Impairment Nursing Diagnoses: Impaired tissue integrity Knowledge deficit related to smoking impact on wound healing Knowledge deficit related to ulceration/compromised skin integrity Goals: Patient will demonstrate a reduced rate of smoking or cessation of smoking Date Initiated: 05/02/2023 Target Resolution Date: 07/02/2023 Goal Status: Active Patient/caregiver will verbalize understanding of skin care regimen Date Initiated: 05/02/2023 Target Resolution Date: 07/02/2023 Goal Status: Active Interventions: Assess patient/caregiver ability to obtain necessary supplies Assess patient/caregiver ability to perform ulcer/skin care regimen upon admission and as needed Assess ulceration(s) every visit Provide education on  smoking [name], [name] ([id number]) [id number] Page 5 of 8 Treatment Activities: Referred to DME Nathanuel Cabreja for dressing supplies : 05/02/2023 Skin care regimen initiated : 05/02/2023 Topical wound management initiated : 05/02/2023 Notes: Electronic Signature(s) Signed: 06/12/2023 4:36:03 PM By: Redmond Pulling RN, BSN Entered By: Redmond Pulling on 06/08/2023 11:02:38 -------------------------------------------------------------------------------- Pain Assessment  Details Patient Name: Date of Service: [name], [name] 06/08/2023 1:45 PM Medical Record Number: [id number] Patient Account Number: [id number] Date of Birth/Sex: Treating RN: Aug 19, 1955 (67 y.o. Tammy Govern Primary Care Loraine Freid: [place] Mon Other Clinician: Referring Kayen Grabel: Treating Sacha Topor/Extender: Cassie Freer in Treatment: 5 Active Problems Location of Pain Severity and Description of Pain Patient Has Paino No Site Locations Pain Management and Medication Current Pain Management: Electronic Signature(s) Signed: 06/12/2023 4:36:03 PM By: Redmond Pulling RN, BSN Entered By: Redmond Pulling on 06/08/2023 10:48:09 -------------------------------------------------------------------------------- Patient/Caregiver Education Details Patient Name: Date of Service: [name] 11/14/2024andnbsp1:45 PM Medical Record Number: [id number] Patient Account Number: [id number] Date of Birth/Gender: Treating RN: 04-13-1956 (67 y.o. Tammy Govern Primary Care Physician: [place] Mon Other Clinician: [name] ([id number]) [id number] Page 6 of 8 Referring Physician: Treating Physician/Extender: Cassie Freer in Treatment: 5 Education Assessment Education Provided To: Patient Education Topics Provided Wound/Skin Impairment: Methods: Explain/Verbal Responses: State content correctly Electronic Signature(s) Signed: 06/12/2023 4:36:03 PM By: Redmond Pulling RN, BSN Entered By: Redmond Pulling on 06/08/2023 11:02:53 -------------------------------------------------------------------------------- Wound Assessment Details Patient Name: Date of Service: [name], [name] 06/08/2023 1:45 PM Medical Record Number: [id number] Patient Account Number: [id number] Date of Birth/Sex: Treating RN: [birth year] (67 y.o. Tammy Govern Primary Care Yisell Sprunger: [place] Mon Other Clinician: Referring  Ludell Zacarias: Treating Asar Evilsizer/Extender: Cassie Freer in Treatment: 5 Wound Status Wound Number: 1 Primary Etiology: Atypical Wound Location: Right T Great oe Wound Status: Open Wounding Event: Gradually Appeared Comorbid History: Hypertension, Rheumatoid Arthritis Date Acquired: 12/07/2022 Weeks Of Treatment: 5 Clustered Wound: No Photos Wound Measurements Length: (cm) 0.2 Width: (cm) 0.2 Depth: (cm) 0.2 Area: (cm) 0.031 Volume: (cm) 0.006 % Reduction in Area: 56.3% % Reduction in Volume: 57.1% Epithelialization: Small (1-33%) Tunneling: No Undermining: No Wound Description Classification: Full Thickness Without Exposed Support Structures Wound Margin: Distinct, outline attached Exudate Amount: Medium Exudate Type: Serosanguineous Exudate Color: red, brown Foul Odor After Cleansing: No Slough/Fibrino Yes Wound Bed [place], [name] ([id number]) [phone].pdf Page 7 of 8 Granulation Amount: Large (67-100%) Exposed Structure Granulation Quality: Red, Pink Fascia Exposed: No Necrotic Amount: Small (1-33%) Fat Layer (Subcutaneous Tissue) Exposed: Yes Necrotic Quality: Adherent Slough Tendon Exposed: No Muscle Exposed: No Joint Exposed: No Bone Exposed: No Periwound Skin Texture Texture Color No Abnormalities Noted: No No Abnormalities Noted: Yes Callus: Yes Temperature / Pain Temperature: No Abnormality Moisture No Abnormalities Noted: Yes Treatment Notes Wound #1 (Toe Great) Wound Laterality: Right Cleanser Soap and Water Discharge Instruction: May shower and wash wound with dial antibacterial soap and water prior to dressing change. Wound Cleanser Discharge Instruction: Cleanse the wound with wound cleanser prior to applying a clean dressing using gauze sponges, not tissue or cotton balls. Peri-Wound Care Topical Gentamicin Discharge Instruction: As directed by physician Mupirocin Ointment Discharge  Instruction: Apply Mupirocin (Bactroban) as instructed Primary Dressing Endoform 2x2 in Discharge Instruction: Moisten with saline Optifoam Non-Adhesive Dressing, 4x4 in Discharge Instruction: Cut out a "donut " shape to cushion Secondary Dressing Woven Gauze Sponges 2x2 in Discharge Instruction: Apply over primary dressing as directed. Secured With Conforming Stretch Gauze Bandage, Sterile 2x75 (in/in) Discharge Instruction:  Secure with stretch gauze as directed. Paper Tape, 2x10 (in/yd) Discharge Instruction: Secure dressing with tape as directed. Compression Wrap Compression Stockings Add-Ons Electronic Signature(s) Signed: 06/12/2023 4:36:03 PM By: Redmond Pulling RN, BSN Entered By: Redmond Pulling on 06/08/2023 10:52:34 -------------------------------------------------------------------------------- Vitals Details Patient Name: Date of Service: [name], CHEATHAM 06/08/2023 1:45 PM Medical Record Number: [id number] Patient Account Number: [id number] Date of Birth/Sex: Treating RN: Sep 12, 1955 (67 y.o. Tammy Govern Primary Care Kateryna Grantham: [place] Mon Other Clinician: [name] ([id number]) [id number] Page 8 of 8 Referring Siani Utke: Treating Markesia Crilly/Extender: Cassie Freer in Treatment: 5 Vital Signs Time Taken: 13:47 Temperature (F): 97.9 Height (in): 66 Pulse (bpm): 89 Weight (lbs): 158 Respiratory Rate (breaths/min): 18 Body Mass Index (BMI): 25.5 Blood Pressure (mmHg): 151/92 Reference Range: 80 - 120 mg / dl Electronic Signature(s) Signed: 06/12/2023 4:36:03 PM By: Redmond Pulling RN, BSN Entered By: Redmond Pulling on 06/08/2023 10:48:02

## 2023-06-12 NOTE — Progress Notes (Signed)
Key, Tammy Kidney ([id number]) [id number] Page 1 of 8 Visit Report for 06/08/2023 Chief Complaint Document Details Patient Name: Date of Service: Tammy, Key 06/08/2023 1:45 PM Medical Record Number: [id number] Patient Account Number: [id number] Date of Birth/Sex: Treating RN: [birth year] (67 y.o. F) Primary Care Provider: Cranford Mon Other Clinician: Referring Provider: Treating Provider/Extender: Cassie Freer in Treatment: 5 Information Obtained from: Patient Chief Complaint Patient seen for complaints of Non-Healing Wound. Electronic Signature(s) Signed: 06/08/2023 3:14:39 PM By: Duanne Guess MD FACS Previous Signature: 06/08/2023 2:47:38 PM Version By: Duanne Guess MD FACS Entered By: Duanne Guess on 06/08/2023 12:14:39 -------------------------------------------------------------------------------- Debridement Details Patient Name: Date of Service: Tammy, Key 06/08/2023 1:45 PM Medical Record Number: [id number] Patient Account Number: [id number] Date of Birth/Sex: Treating RN: 24-Aug-1955 (67 y.o. [name] Primary Care Provider: Cranford Mon Other Clinician: Referring Provider: Treating Provider/Extender: Cassie Freer in Treatment: 5 Debridement Performed for Assessment: Wound #1 Right T Great oe Performed By: Physician Duanne Guess, MD The following information was scribed by: Redmond Pulling The information was scribed for: Duanne Guess Debridement Type: Debridement Level of Consciousness (Pre-procedure): Awake and Alert Pre-procedure Verification/Time Out Yes - 14:05 Taken: Start Time: 14:05 Pain Control: Lidocaine 4% Topical Solution Percent of Wound Bed Debrided: 100% T Area Debrided (cm): otal 0.03 Tissue and other material debrided: Non-Viable, Callus, Slough, Subcutaneous, Slough Level: Skin/Subcutaneous Tissue Debridement Description:  Excisional Instrument: Curette Bleeding: Minimum Hemostasis Achieved: Pressure Response to Treatment: Procedure was tolerated well Level of Consciousness (Post- Awake and Alert procedure): Post Debridement Measurements of Total Wound Length: (cm) 0.2 Width: (cm) 0.2 Depth: (cm) 0.2 Volume: (cm) 0.006 Character of Wound/Ulcer Post Debridement: Improved [name] ([id number]) [id number] Page 2 of 8 Post Procedure Diagnosis Same as Pre-procedure Electronic Signature(s) Signed: 06/08/2023 3:32:46 PM By: Duanne Guess MD FACS Signed: 06/12/2023 4:36:03 PM By: Redmond Pulling RN, BSN Entered By: Redmond Pulling on 06/08/2023 11:06:20 -------------------------------------------------------------------------------- HPI Details Patient Name: Date of Service: Tammy, Key 06/08/2023 1:45 PM Medical Record Number: [id number] Patient Account Number: [id number] Date of Birth/Sex: Treating RN: [birth year] (67 y.o. F) Primary Care Provider: Cranford Mon Other Clinician: Referring Provider: Treating Provider/Extender: Cassie Freer in Treatment: 5 History of Present Illness HPI Description: ADMISSION ***ABI R: 1.15*** This is a 67 year old woman with a longstanding history of tobacco abuse, smoking a pack per day since the age of 41. She also has rheumatoid arthritis for which she receives immunomodulators. She was referred by podiatry for further evaluation and management of a right great toe ulcer. In May of this year, she had a callus debridement that revealed an open wound underlying the callus. It has apparently been managed with Betadine dressing changes and a surgical shoe for offloading. There was some question that she had peripheral neuropathy, but she underwent EMG testing that was negative for neuropathy and radiculopathy. The neurologist suggested that she undergo vascular studies, but I do not see that these have been  performed. Her most recent visit with podiatry was on September 30. The wound still had not healed as a result, she was referred to the wound care center for further evaluation and treatment. 05/12/2023: No significant change to the wound. There is a fair amount of callus accumulation around it and undermining that is created by her skin. There is slough on the surface. 05/19/2023: There has been no change to her wound. There is still callus buildup around the edges and some slough on the surface.  05/24/2023: No significant change to her wound except that might be slightly shallower. 05/31/2023: The diameter/circumference of the wound are the same, but I think it is a little bit shallower. Callus continues to accumulate and encroach over the opening to the wound. She continues to smoke. 06/08/2023: The wound measured just the slightest bit shallower today, with the other dimensions remaining unchanged. Electronic Signature(s) Signed: 06/08/2023 3:14:51 PM By: Duanne Guess MD FACS Previous Signature: 06/08/2023 2:47:57 PM Version By: Duanne Guess MD FACS Entered By: Duanne Guess on 06/08/2023 12:14:51 -------------------------------------------------------------------------------- Physical Exam Details Patient Name: Date of Service: Tammy, Key 06/08/2023 1:45 PM Medical Record Number: [id number] Patient Account Number: [id number] Date of Birth/Sex: Treating RN: 03-04-1956 (67 y.o. F) Primary Care Provider: Cranford Mon Other Clinician: Referring Provider: Treating Provider/Extender: Cassie Freer in Treatment: 5 Constitutional Hypertensive, asymptomatic; she reports whitecoat hypertension. . . . no acute distress. Respiratory Normal work of breathing on room air.Marland Kitchen [place], Tammy Kidney ([id number]) [id number] Page 3 of 8 Notes 06/08/2023: The wound measured just the slightest bit shallower today, with the other dimensions  remaining unchanged. Electronic Signature(s) Signed: 06/08/2023 3:15:20 PM By: Duanne Guess MD FACS Entered By: Duanne Guess on 06/08/2023 12:15:20 -------------------------------------------------------------------------------- Physician Orders Details Patient Name: Date of Service: Tammy, Key 06/08/2023 1:45 PM Medical Record Number: [id number] Patient Account Number: [id number] Date of Birth/Sex: Treating RN: 1955-10-12 (67 y.o. [name] Primary Care Provider: Cranford Mon Other Clinician: Referring Provider: Treating Provider/Extender: Cassie Freer in Treatment: 5 Verbal / Phone Orders: No Diagnosis Coding ICD-10 Coding Code Description [phone] Non-pressure chronic ulcer of other part of right foot with fat layer exposed Z79.69 Long term (current) use of other immunomodulators and immunosuppressants Z72.0 Tobacco use Follow-up Appointments ppointment in 1 week. - Dr. Lady Gary - room 2 Return A Anesthetic (In clinic) Topical Lidocaine 4% applied to wound bed Bathing/ Shower/ Hygiene May shower and wash wound with soap and water. Off-Loading Other: - forefoot offloading shoe to right foot Additional Orders / Instructions Stop/Decrease Smoking Follow Nutritious Diet - 70-100 grams of protein a day, 500 mg of vitamin C once a day and if tolerated then twice a day, zinc 30-50 mg a day Wound Treatment Wound #1 - T Great oe Wound Laterality: Right Cleanser: Soap and Water 1 x Per Day/30 Days Discharge Instructions: May shower and wash wound with dial antibacterial soap and water prior to dressing change. Cleanser: Wound Cleanser 1 x Per Day/30 Days Discharge Instructions: Cleanse the wound with wound cleanser prior to applying a clean dressing using gauze sponges, not tissue or cotton balls. Topical: Gentamicin 1 x Per Day/30 Days Discharge Instructions: As directed by physician Topical: Mupirocin Ointment 1 x Per Day/30  Days Discharge Instructions: Apply Mupirocin (Bactroban) as instructed Prim Dressing: Endoform 2x2 in 1 x Per Day/30 Days ary Discharge Instructions: Moisten with saline Prim Dressing: Optifoam Non-Adhesive Dressing, 4x4 in 1 x Per Day/30 Days ary Discharge Instructions: Cut out a "donut " shape to cushion Secondary Dressing: Woven Gauze Sponges 2x2 in 1 x Per Day/30 Days Discharge Instructions: Apply over primary dressing as directed. Secured With: Insurance underwriter, Sterile 2x75 (in/in) 1 x Per Day/30 Days Discharge Instructions: Secure with stretch gauze as directed. [place], Tammy Kidney ([id number]) [id number] Page 4 of 8 Secured With: Paper Tape, 2x10 (in/yd) 1 x Per Day/30 Days Discharge Instructions: Secure dressing with tape as directed. Patient Medications llergies: abatacept, upadacitinib, etanercept, tocilizumab A Notifications Medication Indication Start End 06/08/2023  lidocaine DOSE topical 4 % cream - cream topical once daily Electronic Signature(s) Signed: 06/08/2023 3:32:46 PM By: Duanne Guess MD FACS Entered By: Duanne Guess on 06/08/2023 12:15:44 -------------------------------------------------------------------------------- Problem List Details Patient Name: Date of Service: Tammy, Key 06/08/2023 1:45 PM Medical Record Number: [id number] Patient Account Number: [id number] Date of Birth/Sex: Treating RN: 1955-08-16 (67 y.o. F) Primary Care Provider: Cranford Mon Other Clinician: Referring Provider: Treating Provider/Extender: Cassie Freer in Treatment: 5 Active Problems ICD-10 Encounter Code Description Active Date MDM Diagnosis L97.512 Non-pressure chronic ulcer of other part of right foot with fat layer exposed 05/02/2023 No Yes Z79.69 Long term (current) use of other immunomodulators and immunosuppressants 05/02/2023 No Yes Z72.0 Tobacco use 05/02/2023 No Yes Inactive  Problems Resolved Problems Electronic Signature(s) Signed: 06/08/2023 3:13:51 PM By: Duanne Guess MD FACS Previous Signature: 06/08/2023 2:47:24 PM Version By: Duanne Guess MD FACS Entered By: Duanne Guess on 06/08/2023 12:13:51 -------------------------------------------------------------------------------- Progress Note Details Patient Name: Date of Service: Tammy, Key 06/08/2023 1:45 PM [name] ([id number]) [id number] Page 5 of 8 Medical Record Number: [id number] Patient Account Number: [id number] Date of Birth/Sex: Treating RN: [birth year] (67 y.o. F) Primary Care Provider: Cranford Mon Other Clinician: Referring Provider: Treating Provider/Extender: Cassie Freer in Treatment: 5 Subjective Chief Complaint Information obtained from Patient Patient seen for complaints of Non-Healing Wound. History of Present Illness (HPI) ADMISSION ***ABI R: 1.15*** This is a 67 year old woman with a longstanding history of tobacco abuse, smoking a pack per day since the age of 9. She also has rheumatoid arthritis for which she receives immunomodulators. She was referred by podiatry for further evaluation and management of a right great toe ulcer. In May of this year, she had a callus debridement that revealed an open wound underlying the callus. It has apparently been managed with Betadine dressing changes and a surgical shoe for offloading. There was some question that she had peripheral neuropathy, but she underwent EMG testing that was negative for neuropathy and radiculopathy. The neurologist suggested that she undergo vascular studies, but I do not see that these have been performed. Her most recent visit with podiatry was on September 30. The wound still had not healed as a result, she was referred to the wound care center for further evaluation and treatment. 05/12/2023: No significant change to the wound. There  is a fair amount of callus accumulation around it and undermining that is created by her skin. There is slough on the surface. 05/19/2023: There has been no change to her wound. There is still callus buildup around the edges and some slough on the surface. 05/24/2023: No significant change to her wound except that might be slightly shallower. 05/31/2023: The diameter/circumference of the wound are the same, but I think it is a little bit shallower. Callus continues to accumulate and encroach over the opening to the wound. She continues to smoke. 06/08/2023: The wound measured just the slightest bit shallower today, with the other dimensions remaining unchanged. Patient History Information obtained from Patient, Chart. Family History Diabetes - Mother, Heart Disease - Father, Hypertension - Father, Kidney Disease - Father, Thyroid Problems - Father, No family history of Cancer, Hereditary Spherocytosis, Lung Disease, Seizures, Stroke, Tuberculosis. Social History Current every day smoker - 1 ppd, Marital Status - Married, Alcohol Use - Never, Drug Use - No History, Caffeine Use - Daily. Medical History Cardiovascular Patient has history of Hypertension Endocrine Denies history of Type II Diabetes Musculoskeletal Patient has history of Rheumatoid  Arthritis Hospitalization/Surgery History - Colonoscopy. - Cesarean section. - Tubal ligation. Medical A Surgical History Notes nd Gastrointestinal colon polyps Integumentary (Skin) viral warts Objective Constitutional Hypertensive, asymptomatic; she reports whitecoat hypertension. no acute distress. Vitals Time Taken: 1:47 PM, Height: 66 in, Weight: 158 lbs, BMI: 25.5, Temperature: 97.9 F, Pulse: 89 bpm, Respiratory Rate: 18 breaths/min, Blood Pressure: 151/92 mmHg. Respiratory Normal work of breathing on room air.. General Notes: 06/08/2023: The wound measured just the slightest bit shallower today, with the other dimensions remaining  unchanged. Integumentary (Hair, Skin) [name], [name] ([id number]) [id number] Page 6 of 8 Wound #1 status is Open. Original cause of wound was Gradually Appeared. The date acquired was: 12/07/2022. The wound has been in treatment 5 weeks. The wound is located on the Right T Great. The wound measures 0.2cm length x 0.2cm width x 0.2cm depth; 0.031cm^2 area and 0.006cm^3 volume. There is Fat oe Layer (Subcutaneous Tissue) exposed. There is no tunneling or undermining noted. There is a medium amount of serosanguineous drainage noted. The wound margin is distinct with the outline attached to the wound base. There is large (67-100%) red, pink granulation within the wound bed. There is a small (1-33%) amount of necrotic tissue within the wound bed including Adherent Slough. The periwound skin appearance had no abnormalities noted for moisture. The periwound skin appearance had no abnormalities noted for color. The periwound skin appearance exhibited: Callus. Periwound temperature was noted as No Abnormality. Assessment Active Problems ICD-10 Non-pressure chronic ulcer of other part of right foot with fat layer exposed Long term (current) use of other immunomodulators and immunosuppressants Tobacco use Procedures Wound #1 Pre-procedure diagnosis of Wound #1 is an Atypical located on the Right T Great . There was a Excisional Skin/Subcutaneous Tissue Debridement with a total oe area of 0.03 sq cm performed by Duanne Guess, MD. With the following instrument(s): Curette to remove Non-Viable tissue/material. Material removed includes Callus, Subcutaneous Tissue, and Slough after achieving pain control using Lidocaine 4% T opical Solution. No specimens were taken. A time out was conducted at 14:05, prior to the start of the procedure. A Minimum amount of bleeding was controlled with Pressure. The procedure was tolerated well. Post Debridement Measurements: 0.2cm length x  0.2cm width x 0.2cm depth; 0.006cm^3 volume. Character of Wound/Ulcer Post Debridement is improved. Post procedure Diagnosis Wound #1: Same as Pre-Procedure Plan Follow-up Appointments: Return Appointment in 1 week. - Dr. Lady Gary - room 2 Anesthetic: (In clinic) Topical Lidocaine 4% applied to wound bed Bathing/ Shower/ Hygiene: May shower and wash wound with soap and water. Off-Loading: Other: - forefoot offloading shoe to right foot Additional Orders / Instructions: Stop/Decrease Smoking Follow Nutritious Diet - 70-100 grams of protein a day, 500 mg of vitamin C once a day and if tolerated then twice a day, zinc 30-50 mg a day The following medication(s) was prescribed: lidocaine topical 4 % cream cream topical once daily was prescribed at facility WOUND #1: - T Great Wound Laterality: Right oe Cleanser: Soap and Water 1 x Per Day/30 Days Discharge Instructions: May shower and wash wound with dial antibacterial soap and water prior to dressing change. Cleanser: Wound Cleanser 1 x Per Day/30 Days Discharge Instructions: Cleanse the wound with wound cleanser prior to applying a clean dressing using gauze sponges, not tissue or cotton balls. Topical: Gentamicin 1 x Per Day/30 Days Discharge Instructions: As directed by physician Topical: Mupirocin Ointment 1 x Per Day/30 Days Discharge Instructions: Apply Mupirocin (Bactroban) as instructed Prim Dressing: Endoform 2x2 in 1  x Per Day/30 Days ary Discharge Instructions: Moisten with saline Prim Dressing: Optifoam Non-Adhesive Dressing, 4x4 in 1 x Per Day/30 Days ary Discharge Instructions: Cut out a "donut " shape to cushion Secondary Dressing: Woven Gauze Sponges 2x2 in 1 x Per Day/30 Days Discharge Instructions: Apply over primary dressing as directed. Secured With: Insurance underwriter, Sterile 2x75 (in/in) 1 x Per Day/30 Days Discharge Instructions: Secure with stretch gauze as directed. Secured With: Paper T ape, 2x10  (in/yd) 1 x Per Day/30 Days Discharge Instructions: Secure dressing with tape as directed. 06/08/2023: The wound measured just the slightest bit shallower today, with the other dimensions remaining unchanged. I used a curette to debride callus, skin, slough, and subcutaneous tissue from the wound. We will continue the mixture of topical gentamicin and mupirocin with endoform. Continue forefoot offloading shoe. Follow-up in 1 week. [place], Tammy Kidney ([id number]) [id number] Page 7 of 8 Electronic Signature(s) Signed: 06/08/2023 3:16:26 PM By: Duanne Guess MD FACS Entered By: Duanne Guess on 06/08/2023 12:16:26 -------------------------------------------------------------------------------- HxROS Details Patient Name: Date of Service: Tammy, Key 06/08/2023 1:45 PM Medical Record Number: [id number] Patient Account Number: [id number] Date of Birth/Sex: Treating RN: 02-06-1956 (67 y.o. F) Primary Care Provider: Cranford Mon Other Clinician: Referring Provider: Treating Provider/Extender: Cassie Freer in Treatment: 5 Information Obtained From Patient Chart Cardiovascular Medical History: Positive for: Hypertension Gastrointestinal Medical History: Past Medical History Notes: colon polyps Endocrine Medical History: Negative for: Type II Diabetes Integumentary (Skin) Medical History: Past Medical History Notes: viral warts Musculoskeletal Medical History: Positive for: Rheumatoid Arthritis Immunizations Pneumococcal Vaccine: Received Pneumococcal Vaccination: Yes Received Pneumococcal Vaccination On or After 60th Birthday: Yes Implantable Devices None Hospitalization / Surgery History Type of Hospitalization/Surgery Colonoscopy Cesarean section Tubal ligation Family and Social History Cancer: No; Diabetes: Yes - Mother; Heart Disease: Yes - Father; Hereditary Spherocytosis: No; Hypertension: Yes - Father;  Kidney Disease: Yes - Father; Lung Disease: No; Seizures: No; Stroke: No; Thyroid Problems: Yes - Father; Tuberculosis: No; Current every day smoker - 1 ppd; Marital Status - Married; Alcohol Use: Never; Drug Use: No History; Caffeine Use: Daily; Financial Concerns: No; Food, Clothing or Shelter Needs: No; Support System Lacking: No; Transportation Concerns: No Electronic Signature(s) Signed: 06/08/2023 3:32:46 PM By: Duanne Guess MD FACS Entered By: Duanne Guess on 06/08/2023 12:14:58 [name] ([id number]) [id number] Page 8 of 8 -------------------------------------------------------------------------------- SuperBill Details Patient Name: Date of Service: Tammy, Key 06/08/2023 Medical Record Number: [id number] Patient Account Number: [id number] Date of Birth/Sex: Treating RN: [birth year] (67 y.o. F) Primary Care Provider: Cranford Mon Other Clinician: Referring Provider: Treating Provider/Extender: Cassie Freer in Treatment: 5 Diagnosis Coding ICD-10 Codes Code Description [phone] Non-pressure chronic ulcer of other part of right foot with fat layer exposed Z79.69 Long term (current) use of other immunomodulators and immunosuppressants Z72.0 Tobacco use Facility Procedures : CPT4 Code: [id number] Description: 11042 - DEB SUBQ TISSUE 20 SQ CM/< ICD-10 Diagnosis Description L97.512 Non-pressure chronic ulcer of other part of right foot with fat layer exposed Modifier: Quantity: 1 Physician Procedures : CPT4 Code Description Modifier [id number] - WC PHYS LEVEL 4 - EST PT ICD-10 Diagnosis Description L97.512 Non-pressure chronic ulcer of other part of right foot with fat layer exposed Z79.69 Long term (current) use of other immunomodulators and  immunosuppressants Z72.0 Tobacco use Quantity: 1 : [id number] 11042 - WC PHYS SUBQ TISS 20 SQ CM ICD-10 Diagnosis Description L97.512 Non-pressure chronic ulcer of other  part of right foot with fat layer  exposed Quantity: 1 Electronic Signature(s) Signed: 06/08/2023 3:16:42 PM By: Duanne Guess MD FACS Entered By: Duanne Guess on 06/08/2023 12:16:42

## 2023-06-13 ENCOUNTER — Encounter (HOSPITAL_BASED_OUTPATIENT_CLINIC_OR_DEPARTMENT_OTHER): Payer: Medicare Other | Admitting: General Surgery

## 2023-06-13 DIAGNOSIS — Z7960 Long term (current) use of unspecified immunomodulators and immunosuppressants: Secondary | ICD-10-CM | POA: Diagnosis not present

## 2023-06-13 DIAGNOSIS — F17210 Nicotine dependence, cigarettes, uncomplicated: Secondary | ICD-10-CM | POA: Diagnosis not present

## 2023-06-13 DIAGNOSIS — S91101A Unspecified open wound of right great toe without damage to nail, initial encounter: Secondary | ICD-10-CM | POA: Diagnosis not present

## 2023-06-13 DIAGNOSIS — L84 Corns and callosities: Secondary | ICD-10-CM | POA: Diagnosis not present

## 2023-06-13 DIAGNOSIS — M069 Rheumatoid arthritis, unspecified: Secondary | ICD-10-CM | POA: Diagnosis not present

## 2023-06-13 DIAGNOSIS — L97512 Non-pressure chronic ulcer of other part of right foot with fat layer exposed: Secondary | ICD-10-CM | POA: Diagnosis not present

## 2023-06-13 NOTE — Progress Notes (Addendum)
[place], [name] ([id number]) [id number] Page 1 of 8 Visit Report for 06/13/2023 Arrival Information Details Patient Name: Date of Service: [name], [name] 06/13/2023 3:15 PM Medical Record Number: [id number] Patient Account Number: [id number] Date of Birth/Sex: Treating RN: 1956/02/15 (67 y.o. [name] Primary Care Maejor Erven: [place] Mon Other Clinician: Referring Alasdair Kleve: Treating Eliyas Suddreth/Extender: Cassie Freer in Treatment: 6 Visit Information History Since Last Visit Added or deleted any medications: No Patient Arrived: Ambulatory Any new allergies or adverse reactions: No Arrival Time: 15:07 Had a fall or experienced change in No Accompanied By: self activities of daily living that may affect Transfer Assistance: None risk of falls: Patient Identification Verified: Yes Signs or symptoms of abuse/neglect since last visito No Secondary Verification Process Completed: Yes Hospitalized since last visit: No Implantable device outside of the clinic excluding No cellular tissue based products placed in the center since last visit: Has Dressing in Place as Prescribed: Yes Pain Present Now: No Electronic Signature(s) Signed: 06/13/2023 3:29:21 PM By: Samuella Bruin Entered By: Samuella Bruin on 06/13/2023 12:08:08 -------------------------------------------------------------------------------- Encounter Discharge Information Details Patient Name: Date of Service: [name], [name] 06/13/2023 3:15 PM Medical Record Number: [id number] Patient Account Number: [id number] Date of Birth/Sex: Treating RN: 07/02/1956 (67 y.o. [name] Primary Care Brantleigh Mifflin: [place] Mon Other Clinician: Referring Carmellia Kreisler: Treating Summar Mcglothlin/Extender: Cassie Freer in Treatment: 6 Encounter Discharge Information Items Post Procedure Vitals Discharge Condition: Stable Temperature (F):  97.9 Ambulatory Status: Ambulatory Pulse (bpm): 89 Discharge Destination: Home Respiratory Rate (breaths/min): 18 Transportation: Private Auto Blood Pressure (mmHg): 151/92 Accompanied By: self Schedule Follow-up Appointment: Yes Clinical Summary of Care: Patient Declined Electronic Signature(s) Signed: 06/13/2023 3:29:21 PM By: Samuella Bruin Entered By: Samuella Bruin on 06/13/2023 12:27:31 [name] ([id number]) [id number] Page 2 of 8 -------------------------------------------------------------------------------- Lower Extremity Assessment Details Patient Name: Date of Service: [name], [name] 06/13/2023 3:15 PM Medical Record Number: [id number] Patient Account Number: [id number] Date of Birth/Sex: Treating RN: [birth year] (67 y.o. [name] Primary Care Marchetta Navratil: [place] Mon Other Clinician: Referring Jaterrius Ricketson: Treating Jackelin Correia/Extender: Cassie Freer in Treatment: 6 Edema Assessment Assessed: [Left: No] [Right: No] [Left: Edema] [Right: :] Calf Left: Right: Point of Measurement: From Medial Instep 37 cm Ankle Left: Right: Point of Measurement: From Medial Instep 22.5 cm Vascular Assessment Extremity colors, hair growth, Tammy conditions: Extremity Color: [Right:Normal] Hair Growth on Extremity: [Right:Yes] Temperature of Extremity: [Right:Warm] Capillary Refill: [Right:< 3 seconds] Dependent Rubor: [Right:No No] Electronic Signature(s) Signed: 06/13/2023 3:29:21 PM By: Samuella Bruin Entered By: Samuella Bruin on 06/13/2023 12:08:20 -------------------------------------------------------------------------------- Multi Wound Chart Details Patient Name: Date of Service: [name], [name] 06/13/2023 3:15 PM Medical Record Number: [id number] Patient Account Number: [id number] Date of Birth/Sex: Treating RN: 1955-11-23 (67 y.o. F) Primary Care [name]: [place] Mon Other  Clinician: Referring Creg Gilmer: Treating Teletha Petrea/Extender: Cassie Freer in Treatment: 6 Vital Signs Height(in): 66 Pulse(bpm): 89 Weight(lbs): 158 Blood Pressure(mmHg): 151/92 Body Mass Index(BMI): 25.5 Temperature(F): 97.9 Respiratory Rate(breaths/min): 18 [1:Photos:] [N/A:N/A] Right T Great oe N/A N/A Wound Location: Gradually Appeared N/A N/A Wounding Event: Atypical N/A N/A Primary Etiology: Hypertension, Rheumatoid Arthritis N/A N/A Comorbid History: 12/07/2022 N/A N/A Date Acquired: 6 N/A N/A Weeks of Treatment: Open N/A N/A Wound Status: No N/A N/A Wound Recurrence: 0.2x0.2x0.2 N/A N/A Measurements L x W x D (cm) 0.031 N/A N/A A (cm) : rea 0.006 N/A N/A Volume (cm) : 56.30% N/A N/A % Reduction in A rea: 57.10% N/A N/A %  Reduction in Volume: Full Thickness Without Exposed N/A N/A Classification: Support Structures Medium N/A N/A Exudate A mount: Serosanguineous N/A N/A Exudate Type: red, brown N/A N/A Exudate Color: Distinct, outline attached N/A N/A Wound Margin: Small (1-33%) N/A N/A Granulation A mount: Red, Pink N/A N/A Granulation Quality: Large (67-100%) N/A N/A Necrotic A mount: Fat Layer (Subcutaneous Tissue): Yes N/A N/A Exposed Structures: Fascia: No Tendon: No Muscle: No Joint: No Bone: No Small (1-33%) N/A N/A Epithelialization: Debridement - Excisional N/A N/A Debridement: Pre-procedure Verification/Time Out 15:15 N/A N/A Taken: Lidocaine 5% topical ointment N/A N/A Pain Control: Callus, Subcutaneous, Slough N/A N/A Tissue Debrided: Skin/Subcutaneous Tissue N/A N/A Level: 0.03 N/A N/A Debridement A (sq cm): rea Curette N/A N/A Instrument: Minimum N/A N/A Bleeding: Pressure N/A N/A Hemostasis A chieved: Procedure was tolerated well N/A N/A Debridement Treatment Response: 0.2x0.2x0.2 N/A N/A Post Debridement Measurements L x W x D (cm) 0.006 N/A N/A Post Debridement Volume:  (cm) Callus: Yes N/A N/A Periwound Skin Texture: No Abnormalities Noted N/A N/A Periwound Skin Moisture: No Abnormalities Noted N/A N/A Periwound Skin Color: No Abnormality N/A N/A Temperature: Debridement N/A N/A Procedures Performed: Treatment Notes Wound #1 (Toe Great) Wound Laterality: Right Cleanser Soap Tammy Water Discharge Instruction: May shower Tammy wash wound with dial antibacterial soap Tammy water prior to dressing change. Wound Cleanser Discharge Instruction: Cleanse the wound with wound cleanser prior to applying a clean dressing using gauze sponges, not tissue or cotton balls. Peri-Wound Care Topical Gentamicin Discharge Instruction: As directed by physician Mupirocin Ointment Discharge Instruction: Apply Mupirocin (Bactroban) as instructed Primary Dressing Endoform 2x2 in Discharge Instruction: Moisten with saline Optifoam Non-Adhesive Dressing, 4x4 in Discharge Instruction: Cut out a "donut " shape to cushion Secondary Dressing Woven Gauze Sponges 2x2 in [place], [name] ([id number]) [id number] Page 4 of 8 Discharge Instruction: Apply over primary dressing as directed. Secured With Conforming Stretch Gauze Bandage, Sterile 2x75 (in/in) Discharge Instruction: Secure with stretch gauze as directed. Paper Tape, 2x10 (in/yd) Discharge Instruction: Secure dressing with tape as directed. Compression Wrap Compression Stockings Add-Ons Electronic Signature(s) Signed: 06/13/2023 3:38:43 PM By: Duanne Guess MD FACS Entered By: Duanne Guess on 06/13/2023 12:38:42 -------------------------------------------------------------------------------- Multi-Disciplinary Care Plan Details Patient Name: Date of Service: ELANNA, [name] 06/13/2023 3:15 PM Medical Record Number: [id number] Patient Account Number: [id number] Date of Birth/Sex: Treating RN: September 07, 1955 (67 y.o. [name] Primary Care Arjun Hard: [place] Mon Other  Clinician: Referring Myrtice Lowdermilk: Treating Kevan Prouty/Extender: Cassie Freer in Treatment: 6 Active Inactive Wound/Skin Impairment Nursing Diagnoses: Impaired tissue integrity Knowledge deficit related to smoking impact on wound healing Knowledge deficit related to ulceration/compromised skin integrity Goals: Patient will demonstrate a reduced rate of smoking or cessation of smoking Date Initiated: 05/02/2023 Target Resolution Date: 07/02/2023 Goal Status: Active Patient/caregiver will verbalize understanding of skin care regimen Date Initiated: 05/02/2023 Target Resolution Date: 07/02/2023 Goal Status: Active Interventions: Assess patient/caregiver ability to obtain necessary supplies Assess patient/caregiver ability to perform ulcer/skin care regimen upon admission Tammy as needed Assess ulceration(s) every visit Provide education on smoking Treatment Activities: Referred to DME Addisen Chappelle for dressing supplies : 05/02/2023 Skin care regimen initiated : 05/02/2023 Topical wound management initiated : 05/02/2023 Notes: Electronic Signature(s) Signed: 06/13/2023 3:29:21 PM By: Samuella Bruin Entered By: Samuella Bruin on 06/13/2023 12:16:23 [name] ([id number]) [id number] Page 5 of 8 -------------------------------------------------------------------------------- Pain Assessment Details Patient Name: Date of Service: DAWT, [name] 06/13/2023 3:15 PM Medical Record Number: [id number] Patient Account Number: [id number] Date of Birth/Sex: Treating RN: 24-Feb-1956 (67 y.o. F)  Samuella Bruin Primary Care Kaslyn Rich[place]: [place] Mon Other Clinician: Referring Jasyn Mey: Treating Hesper Venturella/Extender: Cassie Freer in Treatment: 6 Active Problems Location of Pain Severity Tammy Description of Pain Patient Has Paino No Site Locations Rate the pain. Current Pain Level: 0 Pain Management Tammy  Medication Current Pain Management: Electronic Signature(s) Signed: 06/13/2023 3:29:21 PM By: Samuella Bruin Entered By: Samuella Bruin on 06/13/2023 12:08:14 -------------------------------------------------------------------------------- Patient/Caregiver Education Details Patient Name: Date of Service: [name] 11/19/2024andnbsp3:15 PM Medical Record Number: [id number] Patient Account Number: [id number] Date of Birth/Gender: Treating RN: 23-Mar-1956 (67 y.o. [name] Primary Care Physician: [place] Mon Other Clinician: Referring Physician: Treating Physician/Extender: Cassie Freer in Treatment: 6 Education Assessment Education Provided To: Patient Education Topics Provided Wound/Skin Impairment: Methods: Explain/Verbal Responses: Reinforcements needed, State content correctly [place], [name] ([id number]) [id number] Page 6 of 8 Electronic Signature(s) Signed: 06/13/2023 3:29:21 PM By: Samuella Bruin Entered By: Samuella Bruin on 06/13/2023 12:16:35 -------------------------------------------------------------------------------- Wound Assessment Details Patient Name: Date of Service: [name], [name] 06/13/2023 3:15 PM Medical Record Number: [id number] Patient Account Number: [id number] Date of Birth/Sex: Treating RN: 1956-04-09 (67 y.o. [name] Primary Care Jaquia Benedicto: [place] Mon Other Clinician: Referring Tawny Raspberry: Treating Kaliyah Gladman/Extender: Cassie Freer in Treatment: 6 Wound Status Wound Number: 1 Primary Etiology: Atypical Wound Location: Right T Great oe Wound Status: Open Wounding Event: Gradually Appeared Comorbid History: Hypertension, Rheumatoid Arthritis Date Acquired: 12/07/2022 Weeks Of Treatment: 6 Clustered Wound: No Photos Wound Measurements Length: (cm) 0.2 Width: (cm) 0.2 Depth: (cm) 0.2 Area: (cm) 0.031 Volume: (cm)  0.006 % Reduction in Area: 56.3% % Reduction in Volume: 57.1% Epithelialization: Small (1-33%) Tunneling: No Undermining: No Wound Description Classification: Full Thickness Without Exposed Suppor Wound Margin: Distinct, outline attached Exudate Amount: Medium Exudate Type: Serosanguineous Exudate Color: red, brown t Structures Foul Odor After Cleansing: No Slough/Fibrino Yes Wound Bed Granulation Amount: Small (1-33%) Exposed Structure Granulation Quality: Red, Pink Fascia Exposed: No Necrotic Amount: Large (67-100%) Fat Layer (Subcutaneous Tissue) Exposed: Yes Necrotic Quality: Adherent Slough Tendon Exposed: No Muscle Exposed: No Joint Exposed: No Bone Exposed: No Periwound Skin Texture Texture Color No Abnormalities Noted: No No Abnormalities Noted: Yes Callus: Yes Temperature / Pain Temperature: No Abnormality Moisture No Abnormalities NotedMARLETTE, [place]OK[place] ([id number]) [id number] Page 7 of 8 Treatment Notes Wound #1 (Toe Great) Wound Laterality: Right Cleanser Soap Tammy Water Discharge Instruction: May shower Tammy wash wound with dial antibacterial soap Tammy water prior to dressing change. Wound Cleanser Discharge Instruction: Cleanse the wound with wound cleanser prior to applying a clean dressing using gauze sponges, not tissue or cotton balls. Peri-Wound Care Topical Gentamicin Discharge Instruction: As directed by physician Mupirocin Ointment Discharge Instruction: Apply Mupirocin (Bactroban) as instructed Primary Dressing Endoform 2x2 in Discharge Instruction: Moisten with saline Optifoam Non-Adhesive Dressing, 4x4 in Discharge Instruction: Cut out a "donut " shape to cushion Secondary Dressing Woven Gauze Sponges 2x2 in Discharge Instruction: Apply over primary dressing as directed. Secured With Conforming Stretch Gauze Bandage, Sterile 2x75 (in/in) Discharge Instruction: Secure with stretch gauze as directed. Paper  Tape, 2x10 (in/yd) Discharge Instruction: Secure dressing with tape as directed. Compression Wrap Compression Stockings Add-Ons Electronic Signature(s) Signed: 06/13/2023 3:29:21 PM By: Samuella Bruin Entered By: Samuella Bruin on 06/13/2023 12:11:50 -------------------------------------------------------------------------------- Vitals Details Patient Name: Date of Service: CINDEL, Key 06/13/2023 3:15 PM Medical Record Number: [id number] Patient Account Number: [id number] Date of Birth/Sex: Treating RN: [birth year] (67 y.o. [name] Primary Care Joselle Deeds: Carylon Perches  O Other Clinician: Referring Yaneli Keithley: Treating Conner Neiss/Extender: Cassie Freer in Treatment: 6 Vital Signs Time Taken: 15:26 Temperature (F): 97.9 Height (in): 66 Pulse (bpm): 89 Weight (lbs): 158 Respiratory Rate (breaths/min): 18 Body Mass Index (BMI): 25.5 Blood Pressure (mmHg): 151/92 Reference Range: 80 - 120 mg / dl Electronic Signature(s) Signed: 06/13/2023 3:29:21 PM By: Renaee Munda, [name] ([id number]) [id number] Page 8 of 8 Entered By: Samuella Bruin on 06/13/2023 12:26:19

## 2023-06-13 NOTE — Progress Notes (Signed)
[place], [name] ([id number]) [id number] Page 1 of 8 Visit Report for 06/13/2023 Chief Complaint Document Details Patient Name: Date of Service: [name] 06/13/2023 3:15 PM Medical Record Number: [id number] Patient Account Number: [id number] Date of Birth/Sex: Treating RN: [birth year] (67 y.o. F) Primary Care Provider: Cranford Mon Other Clinician: Referring Provider: Treating Provider/Extender: Cassie Freer in Treatment: 6 Information Obtained from: Patient Chief Complaint Patient seen for complaints of Non-Healing Wound. Electronic Signature(s) Signed: 06/13/2023 3:38:51 PM By: Duanne Guess MD FACS Entered By: Duanne Guess on 06/13/2023 15:38:51 -------------------------------------------------------------------------------- Debridement Details Patient Name: Date of Service: [name], [name] 06/13/2023 3:15 PM Medical Record Number: [id number] Patient Account Number: [id number] Date of Birth/Sex: Treating RN: [name] 20, 1957 (67 y.o. [name] Primary Care Provider: Cranford Mon Other Clinician: Referring Provider: Treating Provider/Extender: Cassie Freer in Treatment: 6 Debridement Performed for Assessment: Wound #1 Right T Great oe Performed By: Physician Duanne Guess, MD The following information was scribed by: Samuella Bruin The information was scribed for: Duanne Guess Debridement Type: Debridement Level of Consciousness (Pre-procedure): Awake and Alert Pre-procedure Verification/Time Out Yes - 15:15 Taken: Start Time: 15:15 Pain Control: Lidocaine 5% topical ointment Percent of Wound Bed Debrided: 100% T Area Debrided (cm): otal 0.03 Tissue and other material debrided: Non-Viable, Callus, Slough, Subcutaneous, Slough Level: Skin/Subcutaneous Tissue Debridement Description: Excisional Instrument: Curette Bleeding: Minimum Hemostasis Achieved:  Pressure Response to Treatment: Procedure was tolerated well Level of Consciousness (Post- Awake and Alert procedure): Post Debridement Measurements of Total Wound Length: (cm) 0.2 Width: (cm) 0.2 Depth: (cm) 0.2 Volume: (cm) 0.006 Character of Wound/Ulcer Post Debridement: Improved Post Procedure Diagnosis [name] ([id number]) [id number] Page 2 of 8 Same as Pre-procedure Electronic Signature(s) Signed: 06/13/2023 3:29:21 PM By: Samuella Bruin Signed: 06/13/2023 3:47:16 PM By: Duanne Guess MD FACS Entered By: Samuella Bruin on 06/13/2023 15:19:58 -------------------------------------------------------------------------------- HPI Details Patient Name: Date of Service: [name], [name] 06/13/2023 3:15 PM Medical Record Number: [id number] Patient Account Number: [id number] Date of Birth/Sex: Treating RN: 18-May-1956 (67 y.o. F) Primary Care Provider: Cranford Mon Other Clinician: Referring Provider: Treating Provider/Extender: Cassie Freer in Treatment: 6 History of Present Illness HPI Description: ADMISSION ***ABI R: 1.15*** This is a 67 year old woman with a longstanding history of tobacco abuse, smoking a pack per day since the age of 80. She also has rheumatoid arthritis for which she receives immunomodulators. She was referred by podiatry for further evaluation and management of a right great toe ulcer. In May of this year, she had a callus debridement that revealed an open wound underlying the callus. It has apparently been managed with Betadine dressing changes and a surgical shoe for offloading. There was some question that she had peripheral neuropathy, but she underwent EMG testing that was negative for neuropathy and radiculopathy. The neurologist suggested that she undergo vascular studies, but I do not see that these have been performed. Her most recent visit with podiatry was on September 30. The  wound still had not healed as a result, she was referred to the wound care center for further evaluation and treatment. 05/12/2023: No significant change to the wound. There is a fair amount of callus accumulation around it and undermining that is created by her skin. There is slough on the surface. 05/19/2023: There has been no change to her wound. There is still callus buildup around the edges and some slough on the surface. 05/24/2023: No significant change to her wound except that might be slightly shallower.  05/31/2023: The diameter/circumference of the wound are the same, but I think it is a little bit shallower. Callus continues to accumulate and encroach over the opening to the wound. She continues to smoke. 06/08/2023: The wound measured just the slightest bit shallower today, with the other dimensions remaining unchanged. 06/13/2023: The wound dimensions are about the same, but it appears that skin is growing down the sides of the wound to the base. There is a little slough on the surface. Electronic Signature(s) Signed: 06/13/2023 3:39:26 PM By: Duanne Guess MD FACS Entered By: Duanne Guess on 06/13/2023 15:39:26 -------------------------------------------------------------------------------- Physical Exam Details Patient Name: Date of Service: [name], [name] 06/13/2023 3:15 PM Medical Record Number: [id number] Patient Account Number: [id number] Date of Birth/Sex: Treating RN: 10-Mar-1956 (67 y.o. F) Primary Care Provider: Cranford Mon Other Clinician: Referring Provider: Treating Provider/Extender: Cassie Freer in Treatment: 6 Constitutional Hypertensive, asymptomatic. . . . no acute distress. Respiratory Normal work of breathing on room air.Marland Kitchen [name], [name] ([id number]) [id number] Page 3 of 8 Notes 06/13/2023: The wound dimensions are about the same, but it appears that skin is growing down the sides of the  wound to the base. There is a little slough on the surface. Electronic Signature(s) Signed: 06/13/2023 3:39:55 PM By: Duanne Guess MD FACS Entered By: Duanne Guess on 06/13/2023 15:39:55 -------------------------------------------------------------------------------- Physician Orders Details Patient Name: Date of Service: [name], [name] 06/13/2023 3:15 PM Medical Record Number: [id number] Patient Account Number: [id number] Date of Birth/Sex: Treating RN: 09/14/1955 (67 y.o. [name] Primary Care Provider: Cranford Mon Other Clinician: Referring Provider: Treating Provider/Extender: Cassie Freer in Treatment: 6 The following information was scribed by: Samuella Bruin The information was scribed for: Duanne Guess Verbal / Phone Orders: No Diagnosis Coding ICD-10 Coding Code Description [phone] Non-pressure chronic ulcer of other part of right foot with fat layer exposed Z79.69 Long term (current) use of other immunomodulators and immunosuppressants Z72.0 Tobacco use Follow-up Appointments ppointment in 1 week. - Dr. Lady Gary - room 2 Return A Anesthetic (In clinic) Topical Lidocaine 5% applied to wound bed Bathing/ Shower/ Hygiene May shower and wash wound with soap and water. Off-Loading Other: - forefoot offloading shoe to right foot Additional Orders / Instructions Stop/Decrease Smoking Follow Nutritious Diet - 70-100 grams of protein a day, 500 mg of vitamin C once a day and if tolerated then twice a day, zinc 30-50 mg a day Wound Treatment Wound #1 - T Great oe Wound Laterality: Right Cleanser: Soap and Water 1 x Per Day/30 Days Discharge Instructions: May shower and wash wound with dial antibacterial soap and water prior to dressing change. Cleanser: Wound Cleanser 1 x Per Day/30 Days Discharge Instructions: Cleanse the wound with wound cleanser prior to applying a clean dressing using gauze sponges, not tissue or  cotton balls. Topical: Gentamicin 1 x Per Day/30 Days Discharge Instructions: As directed by physician Topical: Mupirocin Ointment 1 x Per Day/30 Days Discharge Instructions: Apply Mupirocin (Bactroban) as instructed Prim Dressing: Endoform 2x2 in 1 x Per Day/30 Days ary Discharge Instructions: Moisten with saline Prim Dressing: Optifoam Non-Adhesive Dressing, 4x4 in 1 x Per Day/30 Days ary Discharge Instructions: Cut out a "donut " shape to cushion Secondary Dressing: Woven Gauze Sponges 2x2 in 1 x Per Day/30 Days Discharge Instructions: Apply over primary dressing as directed. [place], [name] ([id number]) [id number] Page 4 of 8 Secured With: Insurance underwriter, Sterile 2x75 (in/in) 1 x Per Day/30 Days Discharge Instructions: Secure with stretch gauze  as directed. Secured With: Paper Tape, 2x10 (in/yd) 1 x Per Day/30 Days Discharge Instructions: Secure dressing with tape as directed. Patient Medications llergies: abatacept, upadacitinib, etanercept, tocilizumab A Notifications Medication Indication Start End 06/13/2023 lidocaine DOSE topical 5 % ointment - ointment topical Electronic Signature(s) Signed: 06/13/2023 3:47:16 PM By: Duanne Guess MD FACS Previous Signature: 06/13/2023 3:29:21 PM Version By: Samuella Bruin Entered By: Duanne Guess on 06/13/2023 15:40:09 -------------------------------------------------------------------------------- Problem List Details Patient Name: Date of Service: [name], [name] 06/13/2023 3:15 PM Medical Record Number: [id number] Patient Account Number: [id number] Date of Birth/Sex: Treating RN: 1955/10/19 (67 y.o. F) Primary Care Provider: Cranford Mon Other Clinician: Referring Provider: Treating Provider/Extender: Cassie Freer in Treatment: 6 Active Problems ICD-10 Encounter Code Description Active Date MDM Diagnosis L97.512 Non-pressure chronic ulcer of  other part of right foot with fat layer exposed 05/02/2023 No Yes Z79.69 Long term (current) use of other immunomodulators and immunosuppressants 05/02/2023 No Yes Z72.0 Tobacco use 05/02/2023 No Yes Inactive Problems Resolved Problems Electronic Signature(s) Signed: 06/13/2023 3:38:35 PM By: Duanne Guess MD FACS Entered By: Duanne Guess on 06/13/2023 15:38:35 [name] ([id number]) [id number] Page 5 of 8 -------------------------------------------------------------------------------- Progress Note Details Patient Name: Date of Service: [name], [name] 06/13/2023 3:15 PM Medical Record Number: [id number] Patient Account Number: [id number] Date of Birth/Sex: Treating RN: 20-Oct-1955 (67 y.o. F) Primary Care Provider: Cranford Mon Other Clinician: Referring Provider: Treating Provider/Extender: Cassie Freer in Treatment: 6 Subjective Chief Complaint Information obtained from Patient Patient seen for complaints of Non-Healing Wound. History of Present Illness (HPI) ADMISSION ***ABI R: 1.15*** This is a 67 year old woman with a longstanding history of tobacco abuse, smoking a pack per day since the age of 33. She also has rheumatoid arthritis for which she receives immunomodulators. She was referred by podiatry for further evaluation and management of a right great toe ulcer. In May of this year, she had a callus debridement that revealed an open wound underlying the callus. It has apparently been managed with Betadine dressing changes and a surgical shoe for offloading. There was some question that she had peripheral neuropathy, but she underwent EMG testing that was negative for neuropathy and radiculopathy. The neurologist suggested that she undergo vascular studies, but I do not see that these have been performed. Her most recent visit with podiatry was on September 30. The wound still had not healed as a result, she  was referred to the wound care center for further evaluation and treatment. 05/12/2023: No significant change to the wound. There is a fair amount of callus accumulation around it and undermining that is created by her skin. There is slough on the surface. 05/19/2023: There has been no change to her wound. There is still callus buildup around the edges and some slough on the surface. 05/24/2023: No significant change to her wound except that might be slightly shallower. 05/31/2023: The diameter/circumference of the wound are the same, but I think it is a little bit shallower. Callus continues to accumulate and encroach over the opening to the wound. She continues to smoke. 06/08/2023: The wound measured just the slightest bit shallower today, with the other dimensions remaining unchanged. 06/13/2023: The wound dimensions are about the same, but it appears that skin is growing down the sides of the wound to the base. There is a little slough on the surface. Patient History Information obtained from Patient, Chart. Family History Diabetes - Mother, Heart Disease - Father, Hypertension - Father, Key Disease - Father,  Thyroid Problems - Father, No family history of Cancer, Hereditary Spherocytosis, Lung Disease, Seizures, Stroke, Tuberculosis. Social History Current every day smoker - 1 ppd, Marital Status - Married, Alcohol Use - Never, Drug Use - No History, Caffeine Use - Daily. Medical History Cardiovascular Patient has history of Hypertension Endocrine Denies history of Type II Diabetes Musculoskeletal Patient has history of Rheumatoid Arthritis Hospitalization/Surgery History - Colonoscopy. - Cesarean section. - Tubal ligation. Medical A Surgical History Notes nd Gastrointestinal colon polyps Integumentary (Skin) viral warts Objective Constitutional Hypertensive, asymptomatic. no acute distress. Vitals Time Taken: 3:26 PM, Height: 66 in, Weight: 158 lbs, BMI: 25.5, Temperature:  97.9 F, Pulse: 89 bpm, Respiratory Rate: 18 breaths/min, Blood Pressure: 151/92 mmHg. [place], [name] ([id number]) [id number] Page 6 of 8 Respiratory Normal work of breathing on room air.. General Notes: 06/13/2023: The wound dimensions are about the same, but it appears that skin is growing down the sides of the wound to the base. There is a little slough on the surface. Integumentary (Hair, Skin) Wound #1 status is Open. Original cause of wound was Gradually Appeared. The date acquired was: 12/07/2022. The wound has been in treatment 6 weeks. The wound is located on the Right T Great. The wound measures 0.2cm length x 0.2cm width x 0.2cm depth; 0.031cm^2 area and 0.006cm^3 volume. There is Fat oe Layer (Subcutaneous Tissue) exposed. There is no tunneling or undermining noted. There is a medium amount of serosanguineous drainage noted. The wound margin is distinct with the outline attached to the wound base. There is small (1-33%) red, pink granulation within the wound bed. There is a large (67-100%) amount of necrotic tissue within the wound bed including Adherent Slough. The periwound skin appearance had no abnormalities noted for moisture. The periwound skin appearance had no abnormalities noted for color. The periwound skin appearance exhibited: Callus. Periwound temperature was noted as No Abnormality. Assessment Active Problems ICD-10 Non-pressure chronic ulcer of other part of right foot with fat layer exposed Long term (current) use of other immunomodulators and immunosuppressants Tobacco use Procedures Wound #1 Pre-procedure diagnosis of Wound #1 is an Atypical located on the Right T Great . There was a Excisional Skin/Subcutaneous Tissue Debridement with a total oe area of 0.03 sq cm performed by Duanne Guess, MD. With the following instrument(s): Curette to remove Non-Viable tissue/material. Material removed includes Callus, Subcutaneous  Tissue, and Slough after achieving pain control using Lidocaine 5% topical ointment. No specimens were taken. A time out was conducted at 15:15, prior to the start of the procedure. A Minimum amount of bleeding was controlled with Pressure. The procedure was tolerated well. Post Debridement Measurements: 0.2cm length x 0.2cm width x 0.2cm depth; 0.006cm^3 volume. Character of Wound/Ulcer Post Debridement is improved. Post procedure Diagnosis Wound #1: Same as Pre-Procedure Plan Follow-up Appointments: Return Appointment in 1 week. - Dr. Lady Gary - room 2 Anesthetic: (In clinic) Topical Lidocaine 5% applied to wound bed Bathing/ Shower/ Hygiene: May shower and wash wound with soap and water. Off-Loading: Other: - forefoot offloading shoe to right foot Additional Orders / Instructions: Stop/Decrease Smoking Follow Nutritious Diet - 70-100 grams of protein a day, 500 mg of vitamin C once a day and if tolerated then twice a day, zinc 30-50 mg a day The following medication(s) was prescribed: lidocaine topical 5 % ointment ointment topical was prescribed at facility WOUND #1: - T Great Wound Laterality: Right oe Cleanser: Soap and Water 1 x Per Day/30 Days Discharge Instructions: May shower and wash wound with dial  antibacterial soap and water prior to dressing change. Cleanser: Wound Cleanser 1 x Per Day/30 Days Discharge Instructions: Cleanse the wound with wound cleanser prior to applying a clean dressing using gauze sponges, not tissue or cotton balls. Topical: Gentamicin 1 x Per Day/30 Days Discharge Instructions: As directed by physician Topical: Mupirocin Ointment 1 x Per Day/30 Days Discharge Instructions: Apply Mupirocin (Bactroban) as instructed Prim Dressing: Endoform 2x2 in 1 x Per Day/30 Days ary Discharge Instructions: Moisten with saline Prim Dressing: Optifoam Non-Adhesive Dressing, 4x4 in 1 x Per Day/30 Days ary Discharge Instructions: Cut out a "donut " shape to  cushion Secondary Dressing: Woven Gauze Sponges 2x2 in 1 x Per Day/30 Days Discharge Instructions: Apply over primary dressing as directed. Secured With: Insurance underwriter, Sterile 2x75 (in/in) 1 x Per Day/30 Days Discharge Instructions: Secure with stretch gauze as directed. Secured With: Paper T ape, 2x10 (in/yd) 1 x Per Day/30 Days Discharge Instructions: Secure dressing with tape as directed. 06/13/2023: The wound dimensions are about the same, but it appears that skin is growing down the sides of the wound to the base. There is a little slough on [place], [name] ([id number]) [id number] Page 7 of 8 the surface. I used a curette to debride callus, slough, and subcutaneous tissue from the wound. We will continue the mixture of topical gentamicin and mupirocin with endoform and a foam donut. Follow-up in 1 week. Electronic Signature(s) Signed: 06/13/2023 3:43:47 PM By: Duanne Guess MD FACS Entered By: Duanne Guess on 06/13/2023 15:43:47 -------------------------------------------------------------------------------- HxROS Details Patient Name: Date of Service: [name], [name] 06/13/2023 3:15 PM Medical Record Number: [id number] Patient Account Number: [id number] Date of Birth/Sex: Treating RN: 1955/12/01 (67 y.o. F) Primary Care Provider: Cranford Mon Other Clinician: Referring Provider: Treating Provider/Extender: Cassie Freer in Treatment: 6 Information Obtained From Patient Chart Cardiovascular Medical History: Positive for: Hypertension Gastrointestinal Medical History: Past Medical History Notes: colon polyps Endocrine Medical History: Negative for: Type II Diabetes Integumentary (Skin) Medical History: Past Medical History Notes: viral warts Musculoskeletal Medical History: Positive for: Rheumatoid Arthritis Immunizations Pneumococcal Vaccine: Received Pneumococcal Vaccination:  Yes Received Pneumococcal Vaccination On or After 60th Birthday: Yes Implantable Devices None Hospitalization / Surgery History Type of Hospitalization/Surgery Colonoscopy Cesarean section Tubal ligation Family and Social History Cancer: No; Diabetes: Yes - Mother; Heart Disease: Yes - Father; Hereditary Spherocytosis: No; Hypertension: Yes - Father; Key Disease: Yes - Father; Lung Disease: No; Seizures: No; Stroke: No; Thyroid Problems: Yes - Father; Tuberculosis: No; Current every day smoker - 1 ppd; Marital Status - Married; [place], [place] ([id number]) [phone].pdf Page 8 of 8 Alcohol Use: Never; Drug Use: No History; Caffeine Use: Daily; Financial Concerns: No; Food, Clothing or Shelter Needs: No; Support System Lacking: No; Transportation Concerns: No Electronic Signature(s) Signed: 06/13/2023 3:47:16 PM By: Duanne Guess MD FACS Entered By: Duanne Guess on 06/13/2023 15:39:32 -------------------------------------------------------------------------------- SuperBill Details Patient Name: Date of Service: [name], [name] 06/13/2023 Medical Record Number: [id number] Patient Account Number: [id number] Date of Birth/Sex: Treating RN: Sep 03, 1955 (67 y.o. F) Primary Care Provider: Cranford Mon Other Clinician: Referring Provider: Treating Provider/Extender: Cassie Freer in Treatment: 6 Diagnosis Coding ICD-10 Codes Code Description [phone] Non-pressure chronic ulcer of other part of right foot with fat layer exposed Z79.69 Long term (current) use of other immunomodulators and immunosuppressants Z72.0 Tobacco use Facility Procedures : CPT4 Code: [id number] Description: 11042 - DEB SUBQ TISSUE 20 SQ CM/< ICD-10 Diagnosis Description L97.512 Non-pressure chronic ulcer of other part of  right foot with fat layer exposed Modifier: Quantity: 1 Physician Procedures : CPT4 Code Description Modifier [id number] - WC PHYS  LEVEL 4 - EST PT ICD-10 Diagnosis Description L97.512 Non-pressure chronic ulcer of other part of right foot with fat layer exposed Z79.69 Long term (current) use of other immunomodulators and  immunosuppressants Z72.0 Tobacco use Quantity: 1 : [id number] 11042 - WC PHYS SUBQ TISS 20 SQ CM ICD-10 Diagnosis Description L97.512 Non-pressure chronic ulcer of other part of right foot with fat layer exposed Quantity: 1 Electronic Signature(s) Signed: 06/13/2023 3:46:01 PM By: Duanne Guess MD FACS Entered By: Duanne Guess on 06/13/2023 15:46:01

## 2023-06-15 ENCOUNTER — Ambulatory Visit (HOSPITAL_BASED_OUTPATIENT_CLINIC_OR_DEPARTMENT_OTHER): Payer: Medicare Other | Admitting: General Surgery

## 2023-06-15 DIAGNOSIS — Z23 Encounter for immunization: Secondary | ICD-10-CM | POA: Diagnosis not present

## 2023-06-15 DIAGNOSIS — N200 Calculus of kidney: Secondary | ICD-10-CM | POA: Diagnosis not present

## 2023-06-15 DIAGNOSIS — M069 Rheumatoid arthritis, unspecified: Secondary | ICD-10-CM | POA: Diagnosis not present

## 2023-06-15 DIAGNOSIS — A064 Amebic liver abscess: Secondary | ICD-10-CM | POA: Diagnosis not present

## 2023-06-15 DIAGNOSIS — Z0000 Encounter for general adult medical examination without abnormal findings: Secondary | ICD-10-CM | POA: Diagnosis not present

## 2023-06-15 DIAGNOSIS — R051 Acute cough: Secondary | ICD-10-CM | POA: Diagnosis not present

## 2023-06-15 DIAGNOSIS — Z0001 Encounter for general adult medical examination with abnormal findings: Secondary | ICD-10-CM | POA: Diagnosis not present

## 2023-06-15 DIAGNOSIS — J431 Panlobular emphysema: Secondary | ICD-10-CM | POA: Diagnosis not present

## 2023-06-15 DIAGNOSIS — I10 Essential (primary) hypertension: Secondary | ICD-10-CM | POA: Diagnosis not present

## 2023-06-15 DIAGNOSIS — E663 Overweight: Secondary | ICD-10-CM | POA: Diagnosis not present

## 2023-06-20 ENCOUNTER — Encounter (HOSPITAL_BASED_OUTPATIENT_CLINIC_OR_DEPARTMENT_OTHER): Payer: Medicare Other | Admitting: General Surgery

## 2023-06-20 DIAGNOSIS — S91301A Unspecified open wound, right foot, initial encounter: Secondary | ICD-10-CM | POA: Diagnosis not present

## 2023-06-20 DIAGNOSIS — F17210 Nicotine dependence, cigarettes, uncomplicated: Secondary | ICD-10-CM | POA: Diagnosis not present

## 2023-06-20 DIAGNOSIS — Z7960 Long term (current) use of unspecified immunomodulators and immunosuppressants: Secondary | ICD-10-CM | POA: Diagnosis not present

## 2023-06-20 DIAGNOSIS — L84 Corns and callosities: Secondary | ICD-10-CM | POA: Diagnosis not present

## 2023-06-20 DIAGNOSIS — L97512 Non-pressure chronic ulcer of other part of right foot with fat layer exposed: Secondary | ICD-10-CM | POA: Diagnosis not present

## 2023-06-20 DIAGNOSIS — M069 Rheumatoid arthritis, unspecified: Secondary | ICD-10-CM | POA: Diagnosis not present

## 2023-06-21 NOTE — Progress Notes (Signed)
Key, Tammy ([id number]) [id number] Page 1 of 8 Visit Report for 06/20/2023 Chief Complaint Document Details Patient Name: Date of Service: [name], [name] 06/20/2023 2:15 PM Medical Record Number: [id number] Patient Account Number: [id number] Date of Birth/Sex: Treating RN: 1955/08/31 (67 y.o. F) Primary Care Provider: Cranford Mon Other Clinician: Referring Provider: Treating Provider/Extender: Cassie Freer in Treatment: 7 Information Obtained from: Patient Chief Complaint Patient seen for complaints of Non-Healing Wound. Electronic Signature(s) Signed: 06/20/2023 3:21:43 PM By: Tammy Guess MD FACS Entered By: [name] on 06/20/2023 15:21:43 -------------------------------------------------------------------------------- Debridement Details Patient Name: Date of Service: [name], [name] 06/20/2023 2:15 PM Medical Record Number: [id number] Patient Account Number: [id number] Date of Birth/Sex: Treating RN: 1956/07/12 (67 y.o. [name] Primary Care Provider: Cranford Mon Other Clinician: Referring Provider: Treating Provider/Extender: Cassie Freer in Treatment: 7 Debridement Performed for Assessment: Wound #1 Right T Great oe Performed By: Physician Tammy Guess, MD The following information was scribed by: Samuella Bruin The information was scribed for: [name] Debridement Type: Debridement Level of Consciousness (Pre-procedure): Awake and Alert Pre-procedure Verification/Time Out Yes - 14:11 Taken: Start Time: 14:11 Pain Control: Lidocaine 4% Topical Solution Percent of Wound Bed Debrided: 100% T Area Debrided (cm): otal 0.03 Tissue and other material debrided: Non-Viable, Callus, Slough, Subcutaneous, Slough Level: Skin/Subcutaneous Tissue Debridement Description: Excisional Instrument: Curette Bleeding: Minimum Hemostasis Achieved:  Pressure Response to Treatment: Procedure was tolerated well Level of Consciousness (Post- Awake and Alert procedure): Post Debridement Measurements of Total Wound Length: (cm) 0.2 Width: (cm) 0.2 Depth: (cm) 0.2 Volume: (cm) 0.006 Character of Wound/Ulcer Post Debridement: Improved Post Procedure Diagnosis [name] ([id number]) [id number] Page 2 of 8 Same as Pre-procedure Electronic Signature(s) Signed: 06/20/2023 5:01:06 PM By: Samuella Bruin Signed: 06/20/2023 5:33:59 PM By: Tammy Guess MD FACS Entered By: Samuella Bruin on 06/20/2023 14:12:53 -------------------------------------------------------------------------------- HPI Details Patient Name: Date of Service: [name], [name] 06/20/2023 2:15 PM Medical Record Number: [id number] Patient Account Number: [id number] Date of Birth/Sex: Treating RN: [birth year] (67 y.o. F) Primary Care Provider: Cranford Mon Other Clinician: Referring Provider: Treating Provider/Extender: Cassie Freer in Treatment: 7 History of Present Illness HPI Description: ADMISSION ***ABI R: 1.15*** This is a 67 year old woman with a longstanding history of tobacco abuse, smoking a pack per day since the age of 40. She also has rheumatoid arthritis for which she receives immunomodulators. She was referred by podiatry for further evaluation and management of a right great toe ulcer. In May of this year, she had a callus debridement that revealed an open wound underlying the callus. It has apparently been managed with Betadine dressing changes and a surgical shoe for offloading. There was some question that she had peripheral neuropathy, but she underwent EMG testing that was negative for neuropathy and radiculopathy. The neurologist suggested that she undergo vascular studies, but I do not see that these have been performed. Her most recent visit with podiatry was on September 30. The  wound still had not healed as a result, she was referred to the wound care center for further evaluation and treatment. 05/12/2023: No significant change to the wound. There is a fair amount of callus accumulation around it and undermining that is created by her skin. There is slough on the surface. 05/19/2023: There has been no change to her wound. There is still callus buildup around the edges and some slough on the surface. 05/24/2023: No significant change to her wound except that might be slightly shallower.  05/31/2023: The diameter/circumference of the wound are the same, but I think it is a little bit shallower. Callus continues to accumulate and encroach over the opening to the wound. She continues to smoke. 06/08/2023: The wound measured just the slightest bit shallower today, with the other dimensions remaining unchanged. 06/13/2023: The wound dimensions are about the same, but it appears that skin is growing down the sides of the wound to the base. There is a little slough on the surface. 06/20/2023: The wound is perhaps a tiny bit shallower. There is slough accumulation on the surface again. Electronic Signature(s) Signed: 06/20/2023 3:22:20 PM By: Tammy Guess MD FACS Entered By: [name] on 06/20/2023 15:22:20 -------------------------------------------------------------------------------- Physical Exam Details Patient Name: Date of Service: [name], [name] 06/20/2023 2:15 PM Medical Record Number: [id number] Patient Account Number: [id number] Date of Birth/Sex: Treating RN: 03-03-1956 (67 y.o. F) Primary Care Provider: Cranford Mon Other Clinician: Referring Provider: Treating Provider/Extender: Eloy End Weeks in Treatment: 7 Constitutional Slightly hypertensive. . . . no acute distress. [name], [name] ([id number]) [id number] Page 3 of 8 Respiratory Normal work of breathing on room air.. Notes 06/20/2023:  The wound is perhaps a tiny bit shallower. There is slough accumulation on the surface again. Electronic Signature(s) Signed: 06/20/2023 3:22:54 PM By: Tammy Guess MD FACS Entered By: [name] on 06/20/2023 15:22:53 -------------------------------------------------------------------------------- Physician Orders Details Patient Name: Date of Service: [name], [name] 06/20/2023 2:15 PM Medical Record Number: [id number] Patient Account Number: [id number] Date of Birth/Sex: Treating RN: 1955/11/26 (67 y.o. [name] Primary Care Provider: Cranford Mon Other Clinician: Referring Provider: Treating Provider/Extender: Cassie Freer in Treatment: 7 The following information was scribed by: Samuella Bruin The information was scribed for: [name] Verbal / Phone Orders: No Diagnosis Coding Follow-up Appointments Return appointment in 3 weeks. - Dr. Lady Gary - room 2 Anesthetic (In clinic) Topical Lidocaine 4% applied to wound bed Bathing/ Shower/ Hygiene May shower and wash wound with soap and water. Off-Loading Other: - forefoot offloading shoe to right foot Additional Orders / Instructions Stop/Decrease Smoking Follow Nutritious Diet - 70-100 grams of protein a day, 500 mg of vitamin C once a day and if tolerated then twice a day, zinc 30-50 mg a day Wound Treatment Wound #1 - T Great oe Wound Laterality: Right Cleanser: Soap and Water 1 x Per Day/15 Days Discharge Instructions: May shower and wash wound with dial antibacterial soap and water prior to dressing change. Cleanser: Wound Cleanser 1 x Per Day/15 Days Discharge Instructions: Cleanse the wound with wound cleanser prior to applying a clean dressing using gauze sponges, not tissue or cotton balls. Topical: Gentamicin 1 x Per Day/15 Days Discharge Instructions: As directed by physician Topical: Mupirocin Ointment 1 x Per Day/15 Days Discharge Instructions: Apply  Mupirocin (Bactroban) as instructed Prim Dressing: Endoform 2x2 in (DME) (Generic) 1 x Per Day/15 Days ary Discharge Instructions: Moisten with saline Prim Dressing: Optifoam Non-Adhesive Dressing, 4x4 in (DME) (Generic) 1 x Per Day/15 Days ary Discharge Instructions: Cut out a "donut " shape to cushion Secondary Dressing: Woven Gauze Sponges 2x2 in (DME) (Generic) 1 x Per Day/15 Days Discharge Instructions: Apply over primary dressing as directed. Secured With: Insurance underwriter, Sterile 2x75 (in/in) (DME) (Generic) 1 x Per Day/15 Days Discharge Instructions: Secure with stretch gauze as directed. Secured With: Paper Tape, 2x10 (in/yd) (DME) (Generic) 1 x Per Day/15 Days Discharge Instructions: Secure dressing with tape as directed. [name], [name] ([id number]) [id number] Page 4 of  8 Patient Medications llergies: abatacept, upadacitinib, etanercept, tocilizumab A Notifications Medication Indication Start End 06/20/2023 lidocaine DOSE topical 4 % cream - cream topical Electronic Signature(s) Signed: 06/20/2023 5:33:59 PM By: Tammy Guess MD FACS Entered By: [name] on 06/20/2023 15:24:01 -------------------------------------------------------------------------------- Problem List Details Patient Name: Date of Service: [name], [name] 06/20/2023 2:15 PM Medical Record Number: [id number] Patient Account Number: [id number] Date of Birth/Sex: Treating RN: Jun 20, 1956 (67 y.o. F) Primary Care Provider: Cranford Mon Other Clinician: Referring Provider: Treating Provider/Extender: Cassie Freer in Treatment: 7 Active Problems ICD-10 Encounter Code Description Active Date MDM Diagnosis L97.512 Non-pressure chronic ulcer of other part of right foot with fat layer exposed 05/02/2023 No Yes Z79.69 Long term (current) use of other immunomodulators and immunosuppressants 05/02/2023 No Yes Z72.0 Tobacco use  05/02/2023 No Yes Inactive Problems Resolved Problems Electronic Signature(s) Signed: 06/20/2023 3:12:06 PM By: Tammy Guess MD FACS Entered By: [name] on 06/20/2023 15:12:06 -------------------------------------------------------------------------------- Progress Note Details Patient Name: Date of Service: [name], [name] 06/20/2023 2:15 PM Medical Record Number: [id number] Patient Account Number: [id number] Date of Birth/Sex: Treating RN: 1955-09-04 (67 y.o. F) Primary Care Provider: Cranford Mon Other Clinician: Referring Provider: Treating Provider/Extender: Yazmeen, Bonar, [name] ([id number]) [id number] Page 5 of 8 Weeks in Treatment: 7 Subjective Chief Complaint Information obtained from Patient Patient seen for complaints of Non-Healing Wound. History of Present Illness (HPI) ADMISSION ***ABI R: 1.15*** This is a 67 year old woman with a longstanding history of tobacco abuse, smoking a pack per day since the age of 81. She also has rheumatoid arthritis for which she receives immunomodulators. She was referred by podiatry for further evaluation and management of a right great toe ulcer. In May of this year, she had a callus debridement that revealed an open wound underlying the callus. It has apparently been managed with Betadine dressing changes and a surgical shoe for offloading. There was some question that she had peripheral neuropathy, but she underwent EMG testing that was negative for neuropathy and radiculopathy. The neurologist suggested that she undergo vascular studies, but I do not see that these have been performed. Her most recent visit with podiatry was on September 30. The wound still had not healed as a result, she was referred to the wound care center for further evaluation and treatment. 05/12/2023: No significant change to the wound. There is a fair amount of callus accumulation around it and  undermining that is created by her skin. There is slough on the surface. 05/19/2023: There has been no change to her wound. There is still callus buildup around the edges and some slough on the surface. 05/24/2023: No significant change to her wound except that might be slightly shallower. 05/31/2023: The diameter/circumference of the wound are the same, but I think it is a little bit shallower. Callus continues to accumulate and encroach over the opening to the wound. She continues to smoke. 06/08/2023: The wound measured just the slightest bit shallower today, with the other dimensions remaining unchanged. 06/13/2023: The wound dimensions are about the same, but it appears that skin is growing down the sides of the wound to the base. There is a little slough on the surface. 06/20/2023: The wound is perhaps a tiny bit shallower. There is slough accumulation on the surface again. Patient History Information obtained from Patient, Chart. Family History Diabetes - Mother, Heart Disease - Father, Hypertension - Father, Kidney Disease - Father, Thyroid Problems - Father, No family history of Cancer, Hereditary Spherocytosis, Lung  Disease, Seizures, Stroke, Tuberculosis. Social History Current every day smoker - 1 ppd, Marital Status - Married, Alcohol Use - Never, Drug Use - No History, Caffeine Use - Daily. Medical History Cardiovascular Patient has history of Hypertension Endocrine Denies history of Type II Diabetes Musculoskeletal Patient has history of Rheumatoid Arthritis Hospitalization/Surgery History - Colonoscopy. - Cesarean section. - Tubal ligation. Medical A Surgical History Notes nd Gastrointestinal colon polyps Integumentary (Skin) viral warts Objective Constitutional Slightly hypertensive. no acute distress. Vitals Time Taken: 2:03 PM, Height: 66 in, Weight: 158 lbs, BMI: 25.5, Temperature: 97.7 F, Pulse: 91 bpm, Respiratory Rate: 16 breaths/min, Blood Pressure: 140/86  mmHg. Respiratory Normal work of breathing on room air.. General Notes: 06/20/2023: The wound is perhaps a tiny bit shallower. There is slough accumulation on the surface again. Integumentary (Hair, Skin) [name], [name] ([id number]) [id number] Page 6 of 8 Wound #1 status is Open. Original cause of wound was Gradually Appeared. The date acquired was: 12/07/2022. The wound has been in treatment 7 weeks. The wound is located on the Right T Great. The wound measures 0.2cm length x 0.2cm width x 0.2cm depth; 0.031cm^2 area and 0.006cm^3 volume. There is Fat oe Layer (Subcutaneous Tissue) exposed. There is no tunneling or undermining noted. There is a medium amount of serosanguineous drainage noted. The wound margin is distinct with the outline attached to the wound base. There is medium (34-66%) red, pink granulation within the wound bed. There is a medium (34-66%) amount of necrotic tissue within the wound bed including Adherent Slough. The periwound skin appearance had no abnormalities noted for moisture. The periwound skin appearance had no abnormalities noted for color. The periwound skin appearance exhibited: Callus. Periwound temperature was noted as No Abnormality. Assessment Active Problems ICD-10 Non-pressure chronic ulcer of other part of right foot with fat layer exposed Long term (current) use of other immunomodulators and immunosuppressants Tobacco use Procedures Wound #1 Pre-procedure diagnosis of Wound #1 is an Atypical located on the Right T Great . There was a Excisional Skin/Subcutaneous Tissue Debridement with a total oe area of 0.03 sq cm performed by Tammy Guess, MD. With the following instrument(s): Curette to remove Non-Viable tissue/material. Material removed includes Callus, Subcutaneous Tissue, and Slough after achieving pain control using Lidocaine 4% T opical Solution. No specimens were taken. A time out was conducted at 14:11,  prior to the start of the procedure. A Minimum amount of bleeding was controlled with Pressure. The procedure was tolerated well. Post Debridement Measurements: 0.2cm length x 0.2cm width x 0.2cm depth; 0.006cm^3 volume. Character of Wound/Ulcer Post Debridement is improved. Post procedure Diagnosis Wound #1: Same as Pre-Procedure Plan Follow-up Appointments: Return appointment in 3 weeks. - Dr. Lady Gary - room 2 Anesthetic: (In clinic) Topical Lidocaine 4% applied to wound bed Bathing/ Shower/ Hygiene: May shower and wash wound with soap and water. Off-Loading: Other: - forefoot offloading shoe to right foot Additional Orders / Instructions: Stop/Decrease Smoking Follow Nutritious Diet - 70-100 grams of protein a day, 500 mg of vitamin C once a day and if tolerated then twice a day, zinc 30-50 mg a day The following medication(s) was prescribed: lidocaine topical 4 % cream cream topical was prescribed at facility WOUND #1: - T Great Wound Laterality: Right oe Cleanser: Soap and Water 1 x Per Day/15 Days Discharge Instructions: May shower and wash wound with dial antibacterial soap and water prior to dressing change. Cleanser: Wound Cleanser 1 x Per Day/15 Days Discharge Instructions: Cleanse the wound with wound cleanser prior to  applying a clean dressing using gauze sponges, not tissue or cotton balls. Topical: Gentamicin 1 x Per Day/15 Days Discharge Instructions: As directed by physician Topical: Mupirocin Ointment 1 x Per Day/15 Days Discharge Instructions: Apply Mupirocin (Bactroban) as instructed Prim Dressing: Endoform 2x2 in (DME) (Generic) 1 x Per Day/15 Days ary Discharge Instructions: Moisten with saline Prim Dressing: Optifoam Non-Adhesive Dressing, 4x4 in (DME) (Generic) 1 x Per Day/15 Days ary Discharge Instructions: Cut out a "donut " shape to cushion Secondary Dressing: Woven Gauze Sponges 2x2 in (DME) (Generic) 1 x Per Day/15 Days Discharge Instructions: Apply over  primary dressing as directed. Secured With: Insurance underwriter, Sterile 2x75 (in/in) (DME) (Generic) 1 x Per Day/15 Days Discharge Instructions: Secure with stretch gauze as directed. Secured With: Paper T ape, 2x10 (in/yd) (DME) (Generic) 1 x Per Day/15 Days Discharge Instructions: Secure dressing with tape as directed. 06/20/2023: The wound is perhaps a tiny bit shallower. There is slough accumulation on the surface again. I used a curette to debride slough and subcutaneous tissue from the wound. We will continue the mixture of topical gentamicin and mupirocin with endoform and a foam donut to offload pressure. Due to clinic scheduling and provider availability, she will follow-up in 3 weeks. [name], [name] ([id number]) [id number] Page 7 of 8 Electronic Signature(s) Signed: 06/20/2023 3:24:49 PM By: Tammy Guess MD FACS Entered By: [name] on 06/20/2023 15:24:49 -------------------------------------------------------------------------------- HxROS Details Patient Name: Date of Service: [name], [name] 06/20/2023 2:15 PM Medical Record Number: [id number] Patient Account Number: [id number] Date of Birth/Sex: Treating RN: 1956-07-17 (67 y.o. F) Primary Care Provider: Cranford Mon Other Clinician: Referring Provider: Treating Provider/Extender: Cassie Freer in Treatment: 7 Information Obtained From Patient Chart Cardiovascular Medical History: Positive for: Hypertension Gastrointestinal Medical History: Past Medical History Notes: colon polyps Endocrine Medical History: Negative for: Type II Diabetes Integumentary (Skin) Medical History: Past Medical History Notes: viral warts Musculoskeletal Medical History: Positive for: Rheumatoid Arthritis Immunizations Pneumococcal Vaccine: Received Pneumococcal Vaccination: Yes Received Pneumococcal Vaccination On or After 60th Birthday:  Yes Implantable Devices None Hospitalization / Surgery History Type of Hospitalization/Surgery Colonoscopy Cesarean section Tubal ligation Family and Social History Cancer: No; Diabetes: Yes - Mother; Heart Disease: Yes - Father; Hereditary Spherocytosis: No; Hypertension: Yes - Father; Kidney Disease: Yes - Father; Lung Disease: No; Seizures: No; Stroke: No; Thyroid Problems: Yes - Father; Tuberculosis: No; Current every day smoker - 1 ppd; Marital Status - Married; Alcohol Use: Never; Drug Use: No History; Caffeine Use: Daily; Financial Concerns: No; Food, Clothing or Shelter Needs: No; Support System Lacking: No; Transportation Concerns: No Electronic Signature(s) Signed: 06/20/2023 5:33:59 PM By: Tammy Guess MD FACS Entered By: [name] on 06/20/2023 15:22:26 [name] ([id number]) [id number] Page 8 of 8 -------------------------------------------------------------------------------- SuperBill Details Patient Name: Date of Service: THERESA, LUTTS 06/20/2023 Medical Record Number: [id number] Patient Account Number: [id number] Date of Birth/Sex: Treating RN: 1955/12/18 (67 y.o. F) Primary Care Provider: Cranford Mon Other Clinician: Referring Provider: Treating Provider/Extender: Cassie Freer in Treatment: 7 Diagnosis Coding ICD-10 Codes Code Description [id number] Non-pressure chronic ulcer of other part of right foot with fat layer exposed Z79.69 Long term (current) use of other immunomodulators and immunosuppressants Z72.0 Tobacco use Facility Procedures : CPT4 Code: 41324401 Description: 11042 - DEB SUBQ TISSUE 20 SQ CM/< ICD-10 Diagnosis Description L97.512 Non-pressure chronic ulcer of other part of right foot with fat layer exposed Modifier: Quantity: 1 Physician Procedures : CPT4 Code Description Modifier [id number] - WC  PHYS LEVEL 4 - EST PT ICD-10 Diagnosis Description L97.512 Non-pressure  chronic ulcer of other part of right foot with fat layer exposed Z79.69 Long term (current) use of other immunomodulators and  immunosuppressants Z72.0 Tobacco use Quantity: 1 : [id number] 11042 - WC PHYS SUBQ TISS 20 SQ CM ICD-10 Diagnosis Description L97.512 Non-pressure chronic ulcer of other part of right foot with fat layer exposed Quantity: 1 Electronic Signature(s) Signed: 06/20/2023 3:25:25 PM By: Tammy Guess MD FACS Entered By: [name] on 06/20/2023 15:25:25

## 2023-06-21 NOTE — Progress Notes (Signed)
Tammy, Key ([id number]) [id number] Page 1 of 8 Visit Report for 06/20/2023 Arrival Information Details Patient Name: Date of Service: [name], [name] 06/20/2023 2:15 PM Medical Record Number: [id number] Patient Account Number: [id number] Date of Birth/Sex: Treating RN: June 16, 1956 (67 y.o. [name] Primary Care Yeva Bissette: [place] Other Clinician: Referring Rayssa Atha: Treating Agnieszka Newhouse/Extender: Cassie Freer in Treatment: 7 Visit Information History Since Last Visit Added or deleted any medications: No Patient Arrived: Ambulatory Any new allergies or adverse reactions: No Arrival Time: 14:00 Had a fall or experienced change in No Accompanied By: self activities of daily living that may affect Transfer Assistance: None risk of falls: Patient Identification Verified: Yes Signs or symptoms of abuse/neglect since last visito No Secondary Verification Process Completed: Yes Hospitalized since last visit: No Implantable device outside of the clinic excluding No cellular tissue based products placed in the center since last visit: Has Dressing in Place as Prescribed: Yes Pain Present Now: No Electronic Signature(s) Signed: 06/20/2023 5:01:06 PM By: Samuella Bruin Entered By: Samuella Bruin on 06/20/2023 14:02:45 -------------------------------------------------------------------------------- Encounter Discharge Information Details Patient Name: Date of Service: [name], [name] 06/20/2023 2:15 PM Medical Record Number: [id number] Patient Account Number: [id number] Date of Birth/Sex: Treating RN: Jul 19, 1956 (67 y.o. [name] Primary Care Adessa Primiano: [place] Other Clinician: Referring Jrake Rodriquez: Treating Derel Mcglasson/Extender: Cassie Freer in Treatment: 7 Encounter Discharge Information Items Post Procedure Vitals Discharge Condition: Stable Temperature (F):  97.7 Ambulatory Status: Ambulatory Pulse (bpm): 91 Discharge Destination: Home Respiratory Rate (breaths/min): 16 Transportation: Private Auto Blood Pressure (mmHg): 140/86 Accompanied By: self Schedule Follow-up Appointment: Yes Clinical Summary of Care: Patient Declined Electronic Signature(s) Signed: 06/20/2023 5:01:06 PM By: Samuella Bruin Entered By: Samuella Bruin on 06/20/2023 14:24:55 [name] ([id number]) [id number] Page 2 of 8 -------------------------------------------------------------------------------- Lower Extremity Assessment Details Patient Name: Date of Service: [name], [name] 06/20/2023 2:15 PM Medical Record Number: [id number] Patient Account Number: [id number] Date of Birth/Sex: Treating RN: 1956/06/19 (67 y.o. [name] Primary Care Hamza Empson: [place] Other Clinician: Referring Akacia Boltz: Treating Neill Jurewicz/Extender: Eloy End Weeks in Treatment: 7 Edema Assessment Assessed: [Left: No] [Right: No] [Left: Edema] [Right: :] Calf Left: Right: Point of Measurement: From Medial Instep 37 cm Ankle Left: Right: Point of Measurement: From Medial Instep 22.5 cm Vascular Assessment Extremity colors, hair growth, and conditions: Extremity Color: [Right:Normal] Hair Growth on Extremity: [Right:Yes] Temperature of Extremity: [Right:Warm] Capillary Refill: [Right:< 3 seconds] Dependent Rubor: [Right:No No] Electronic Signature(s) Signed: 06/20/2023 5:01:06 PM By: Samuella Bruin Entered By: Samuella Bruin on 06/20/2023 14:02:57 -------------------------------------------------------------------------------- Multi Wound Chart Details Patient Name: Date of Service: [name] 06/20/2023 2:15 PM Medical Record Number: [id number] Patient Account Number: [id number] Date of Birth/Sex: Treating RN: November 11, 1955 (67 y.o. F) Primary Care Massimo Hartland: [place] Mon Other  Clinician: Referring Khalilah Hoke: Treating Vedansh Kerstetter/Extender: Cassie Freer in Treatment: 7 Vital Signs Height(in): 66 Pulse(bpm): 91 Weight(lbs): 158 Blood Pressure(mmHg): 140/86 Body Mass Index(BMI): 25.5 Temperature(F): 97.7 Respiratory Rate(breaths/min): 16 [1:Photos:] [N/A:N/A] Right T Great oe N/A N/A Wound Location: Gradually Appeared N/A N/A Wounding Event: Atypical N/A N/A Primary Etiology: Hypertension, Rheumatoid Arthritis N/A N/A Comorbid History: 12/07/2022 N/A N/A Date Acquired: 7 N/A N/A Weeks of Treatment: Open N/A N/A Wound Status: No N/A N/A Wound Recurrence: 0.2x0.2x0.2 N/A N/A Measurements L x W x D (cm) 0.031 N/A N/A A (cm) : rea 0.006 N/A N/A Volume (cm) : 56.30% N/A N/A % Reduction in A rea: 57.10% N/A N/A %  Reduction in Volume: Full Thickness Without Exposed N/A N/A Classification: Support Structures Medium N/A N/A Exudate A mount: Serosanguineous N/A N/A Exudate Type: red, brown N/A N/A Exudate Color: Distinct, outline attached N/A N/A Wound Margin: Medium (34-66%) N/A N/A Granulation A mount: Red, Pink N/A N/A Granulation Quality: Medium (34-66%) N/A N/A Necrotic A mount: Fat Layer (Subcutaneous Tissue): Yes N/A N/A Exposed Structures: Fascia: No Tendon: No Muscle: No Joint: No Bone: No Medium (34-66%) N/A N/A Epithelialization: Debridement - Excisional N/A N/A Debridement: Pre-procedure Verification/Time Out 14:11 N/A N/A Taken: Lidocaine 4% Topical Solution N/A N/A Pain Control: Callus, Subcutaneous, Slough N/A N/A Tissue Debrided: Skin/Subcutaneous Tissue N/A N/A Level: 0.03 N/A N/A Debridement A (sq cm): rea Curette N/A N/A Instrument: Minimum N/A N/A Bleeding: Pressure N/A N/A Hemostasis A chieved: Procedure was tolerated well N/A N/A Debridement Treatment Response: 0.2x0.2x0.2 N/A N/A Post Debridement Measurements L x W x D (cm) 0.006 N/A N/A Post Debridement Volume:  (cm) Callus: Yes N/A N/A Periwound Skin Texture: No Abnormalities Noted N/A N/A Periwound Skin Moisture: No Abnormalities Noted N/A N/A Periwound Skin Color: No Abnormality N/A N/A Temperature: Debridement N/A N/A Procedures Performed: Treatment Notes Wound #1 (Toe Great) Wound Laterality: Right Cleanser Soap and Water Discharge Instruction: May shower and wash wound with dial antibacterial soap and water prior to dressing change. Wound Cleanser Discharge Instruction: Cleanse the wound with wound cleanser prior to applying a clean dressing using gauze sponges, not tissue or cotton balls. Peri-Wound Care Topical Gentamicin Discharge Instruction: As directed by physician Mupirocin Ointment Discharge Instruction: Apply Mupirocin (Bactroban) as instructed Primary Dressing Endoform 2x2 in Discharge Instruction: Moisten with saline Optifoam Non-Adhesive Dressing, 4x4 in Discharge Instruction: Cut out a "donut " shape to cushion Secondary Dressing Woven Gauze Sponges 2x2 in [place], [name] ([id number]) [id number] Page 4 of 8 Discharge Instruction: Apply over primary dressing as directed. Secured With Conforming Stretch Gauze Bandage, Sterile 2x75 (in/in) Discharge Instruction: Secure with stretch gauze as directed. Paper Tape, 2x10 (in/yd) Discharge Instruction: Secure dressing with tape as directed. Compression Wrap Compression Stockings Add-Ons Electronic Signature(s) Signed: 06/20/2023 3:12:14 PM By: Duanne Guess MD FACS Entered By: Duanne Guess on 06/20/2023 15:12:14 -------------------------------------------------------------------------------- Multi-Disciplinary Care Plan Details Patient Name: Date of Service: ICES, KAMERER 06/20/2023 2:15 PM Medical Record Number: [id number] Patient Account Number: [id number] Date of Birth/Sex: Treating RN: 11/10/1955 (67 y.o. [name] Primary Care Trentin Knappenberger: [place] Other  Clinician: Referring Mohannad Olivero: Treating Leniyah Martell/Extender: Cassie Freer in Treatment: 7 Active Inactive Wound/Skin Impairment Nursing Diagnoses: Impaired tissue integrity Knowledge deficit related to smoking impact on wound healing Knowledge deficit related to ulceration/compromised skin integrity Goals: Patient will demonstrate a reduced rate of smoking or cessation of smoking Date Initiated: 05/02/2023 Target Resolution Date: 07/02/2023 Goal Status: Active Patient/caregiver will verbalize understanding of skin care regimen Date Initiated: 05/02/2023 Target Resolution Date: 07/02/2023 Goal Status: Active Interventions: Assess patient/caregiver ability to obtain necessary supplies Assess patient/caregiver ability to perform ulcer/skin care regimen upon admission and as needed Assess ulceration(s) every visit Provide education on smoking Treatment Activities: Referred to DME Jaycey Gens for dressing supplies : 05/02/2023 Skin care regimen initiated : 05/02/2023 Topical wound management initiated : 05/02/2023 Notes: Electronic Signature(s) Signed: 06/20/2023 5:01:06 PM By: Samuella Bruin Entered By: Samuella Bruin on 06/20/2023 14:23:24 [name] ([id number]) [id number] Page 5 of 8 -------------------------------------------------------------------------------- Pain Assessment Details Patient Name: Date of Service: [name], [name] 06/20/2023 2:15 PM Medical Record Number: [id number] Patient Account Number: [id number] Date of Birth/Sex: Treating RN: [birth year] (67 y.o. F)  Samuella Bruin Primary Care Dariane Natzke: [place] Mon Other Clinician: Referring Shawna Kiener: Treating Bailea Beed/Extender: Cassie Freer in Treatment: 7 Active Problems Location of Pain Severity and Description of Pain Patient Has Paino No Site Locations Rate the pain. Current Pain Level: 0 Pain Management and  Medication Current Pain Management: Electronic Signature(s) Signed: 06/20/2023 5:01:06 PM By: Samuella Bruin Entered By: Samuella Bruin on 06/20/2023 14:02:54 -------------------------------------------------------------------------------- Patient/Caregiver Education Details Patient Name: Date of Service: [name] 11/26/2024andnbsp2:15 PM Medical Record Number: [id number] Patient Account Number: [id number] Date of Birth/Gender: Treating RN: [birth year] (67 y.o. [name] Primary Care Physician: [place] Mon Other Clinician: Referring Physician: Treating Physician/Extender: Cassie Freer in Treatment: 7 Education Assessment Education Provided To: Patient Education Topics Provided Wound/Skin Impairment: Methods: Explain/Verbal Responses: Reinforcements needed, State content correctly [place], [name] ([id number]) [id number] Page 6 of 8 Electronic Signature(s) Signed: 06/20/2023 5:01:06 PM By: Gelene Mink By: Samuella Bruin on 06/20/2023 14:23:36 -------------------------------------------------------------------------------- Wound Assessment Details Patient Name: Date of Service: [name], [name] 06/20/2023 2:15 PM Medical Record Number: [id number] Patient Account Number: [id number] Date of Birth/Sex: Treating RN: 04-30-1956 (67 y.o. [name] Primary Care Adem Costlow: [place] Other Clinician: Referring Leelynd Maldonado: Treating Maeve Debord/Extender: Cassie Freer in Treatment: 7 Wound Status Wound Number: 1 Primary Etiology: Atypical Wound Location: Right T Great oe Wound Status: Open Wounding Event: Gradually Appeared Comorbid History: Hypertension, Rheumatoid Arthritis Date Acquired: 12/07/2022 Weeks Of Treatment: 7 Clustered Wound: No Photos Wound Measurements Length: (cm) 0.2 Width: (cm) 0.2 Depth: (cm) 0.2 Area: (cm) 0.031 Volume: (cm)  0.006 % Reduction in Area: 56.3% % Reduction in Volume: 57.1% Epithelialization: Medium (34-66%) Tunneling: No Undermining: No Wound Description Classification: Full Thickness Without Exposed Suppor Wound Margin: Distinct, outline attached Exudate Amount: Medium Exudate Type: Serosanguineous Exudate Color: red, brown t Structures Foul Odor After Cleansing: No Slough/Fibrino Yes Wound Bed Granulation Amount: Medium (34-66%) Exposed Structure Granulation Quality: Red, Pink Fascia Exposed: No Necrotic Amount: Medium (34-66%) Fat Layer (Subcutaneous Tissue) Exposed: Yes Necrotic Quality: Adherent Slough Tendon Exposed: No Muscle Exposed: No Joint Exposed: No Bone Exposed: No Periwound Skin Texture Texture Color No Abnormalities Noted: No No Abnormalities Noted: Yes Callus: Yes Temperature / Pain Temperature: No Abnormality Moisture No Abnormalities NotedSOUNDRA, [name] ([id number]) [id number] Page 7 of 8 Treatment Notes Wound #1 (Toe Great) Wound Laterality: Right Cleanser Soap and Water Discharge Instruction: May shower and wash wound with dial antibacterial soap and water prior to dressing change. Wound Cleanser Discharge Instruction: Cleanse the wound with wound cleanser prior to applying a clean dressing using gauze sponges, not tissue or cotton balls. Peri-Wound Care Topical Gentamicin Discharge Instruction: As directed by physician Mupirocin Ointment Discharge Instruction: Apply Mupirocin (Bactroban) as instructed Primary Dressing Endoform 2x2 in Discharge Instruction: Moisten with saline Optifoam Non-Adhesive Dressing, 4x4 in Discharge Instruction: Cut out a "donut " shape to cushion Secondary Dressing Woven Gauze Sponges 2x2 in Discharge Instruction: Apply over primary dressing as directed. Secured With Conforming Stretch Gauze Bandage, Sterile 2x75 (in/in) Discharge Instruction: Secure with stretch gauze as  directed. Paper Tape, 2x10 (in/yd) Discharge Instruction: Secure dressing with tape as directed. Compression Wrap Compression Stockings Add-Ons Electronic Signature(s) Signed: 06/20/2023 5:01:06 PM By: Samuella Bruin Signed: 06/20/2023 5:13:41 PM By: Shawn Stall RN, BSN Entered By: Shawn Stall on 06/20/2023 14:06:44 -------------------------------------------------------------------------------- Vitals Details Patient Name: Date of Service: TENEA, [name] 06/20/2023 2:15 PM Medical Record Number: [id number] Patient Account Number: [id number] Date of Birth/Sex: Treating RN: 17-Dec-1955 (67  y.o. [name] Primary Care Sequan Auxier: [place] Mon Other Clinician: Referring Harinder Romas: Treating Graysin Luczynski/Extender: Cassie Freer in Treatment: 7 Vital Signs Time Taken: 14:03 Temperature (F): 97.7 Height (in): 66 Pulse (bpm): 91 Weight (lbs): 158 Respiratory Rate (breaths/min): 16 Body Mass Index (BMI): 25.5 Blood Pressure (mmHg): 140/86 Reference Range: 80 - 120 mg / dl Electronic Signature(s) Signed: 06/20/2023 5:01:06 PM By: Renaee Munda, [name] ([id number]) ByAbbey [email or web address].pdf Page 8 of 8 Signed: 06/20/2023 5:01:06 PM aylor Entered By: Samuella Bruin on 06/20/2023 14:03:59

## 2023-07-20 ENCOUNTER — Ambulatory Visit (HOSPITAL_BASED_OUTPATIENT_CLINIC_OR_DEPARTMENT_OTHER): Payer: Medicare Other | Admitting: General Surgery

## 2023-07-21 ENCOUNTER — Encounter (HOSPITAL_BASED_OUTPATIENT_CLINIC_OR_DEPARTMENT_OTHER): Payer: Medicare Other | Attending: General Surgery | Admitting: General Surgery

## 2023-07-21 DIAGNOSIS — L97512 Non-pressure chronic ulcer of other part of right foot with fat layer exposed: Secondary | ICD-10-CM | POA: Insufficient documentation

## 2023-07-21 DIAGNOSIS — F17200 Nicotine dependence, unspecified, uncomplicated: Secondary | ICD-10-CM | POA: Insufficient documentation

## 2023-07-21 DIAGNOSIS — M059 Rheumatoid arthritis with rheumatoid factor, unspecified: Secondary | ICD-10-CM | POA: Insufficient documentation

## 2023-07-21 DIAGNOSIS — S91101A Unspecified open wound of right great toe without damage to nail, initial encounter: Secondary | ICD-10-CM | POA: Diagnosis not present

## 2023-07-21 DIAGNOSIS — Z7969 Long term (current) use of other immunomodulators and immunosuppressants: Secondary | ICD-10-CM | POA: Diagnosis not present

## 2023-07-21 NOTE — Progress Notes (Signed)
[place], [name] ([id number]) [id number] Page 1 of 7 Visit Report for 07/21/2023 Arrival Information Details Patient Name: Date of Service: [name], [name] 07/21/2023 9:45 A M Medical Record Number: [id number] Patient Account Number: [id number] Date of Birth/Sex: Treating RN: 13-Apr-1956 (67 y.o. F) Primary Care Taniya Dasher: [place] [facility] Other Clinician: Referring Adelis Docter: Treating Der Gagliano/Extender: Cassie Freer in Treatment: 11 Visit Information History Since Last Visit Added or deleted any medications: No Patient Arrived: Ambulatory Any new allergies or adverse reactions: No Arrival Time: 10:46 Had a fall or experienced change in No Accompanied By: husband activities of daily living that may affect Transfer Assistance: None risk of falls: Patient Identification Verified: Yes Signs or symptoms of abuse/neglect since last visito No Secondary Verification Process Completed: Yes Hospitalized since last visit: No Implantable device outside of the clinic excluding No cellular tissue based products placed in the center since last visit: Has Dressing in Place as Prescribed: Yes Has Footwear/Offloading in Place as Prescribed: Yes Right: Wedge Shoe Pain Present Now: No Electronic Signature(s) Signed: 07/21/2023 12:40:33 PM By: Thayer Dallas Entered By: Thayer Dallas on 07/21/2023 07:47:13 -------------------------------------------------------------------------------- Encounter Discharge Information Details Patient Name: Date of Service: [name], [name] 07/21/2023 9:45 A M Medical Record Number: [id number] Patient Account Number: [id number] Date of Birth/Sex: Treating RN: January 05, 1956 (67 y.o. [name] Primary Care Hades Mathew: [place] [facility] Other Clinician: Referring Emberlie Gotcher: Treating Sameerah Nachtigal/Extender: Cassie Freer in Treatment: 11 Encounter Discharge Information Items Post Procedure  Vitals Discharge Condition: Stable Temperature (F): 97.5 Ambulatory Status: Ambulatory Pulse (bpm): 82 Discharge Destination: Home Respiratory Rate (breaths/min): 18 Transportation: Private Auto Blood Pressure (mmHg): 151/93 Accompanied By: spouse Schedule Follow-up Appointment: Yes Clinical Summary of Care: Patient Declined Electronic Signature(s) Signed: 07/21/2023 1:00:49 PM By: Zenaida Deed RN, BSN Entered By: Zenaida Deed on 07/21/2023 09:33:25 [name] ([id number]) [id number] Page 2 of 7 -------------------------------------------------------------------------------- Lower Extremity Assessment Details Patient Name: Date of Service: [name], [name] 07/21/2023 9:45 A M Medical Record Number: [id number] Patient Account Number: [id number] Date of Birth/Sex: Treating RN: 06-03-1956 (67 y.o. F) Primary Care Trenee Igoe: [place] [facility] Other Clinician: Referring Hinton Luellen: Treating Lucee Brissett/Extender: Cassie Freer in Treatment: 11 Edema Assessment Assessed: [Left: No] [Right: No] [Left: Edema] [Right: :] Calf Left: Right: Point of Measurement: From Medial Instep 37.1 cm Ankle Left: Right: Point of Measurement: From Medial Instep 23.1 cm Vascular Assessment Extremity colors, hair growth, and conditions: Extremity Color: [Right:Normal] Hair Growth on Extremity: [Right:Yes] Temperature of Extremity: [Right:Warm] Capillary Refill: [Right:< 3 seconds] Dependent Rubor: [Right:No No] Toe Nail Assessment Left: Right: Thick: No Discolored: No Deformed: No Improper Length and Hygiene: No Electronic Signature(s) Signed: 07/21/2023 12:40:33 PM By: Thayer Dallas Entered By: Thayer Dallas on 07/21/2023 07:52:02 -------------------------------------------------------------------------------- Multi Wound Chart Details Patient Name: Date of Service: [name], [name] 07/21/2023 9:45 A M Medical Record Number:  [id number] Patient Account Number: [id number] Date of Birth/Sex: Treating RN: 01/07/1956 (67 y.o. F) Primary Care Cortland Crehan: [place] [facility] Other Clinician: Referring Krisa Blattner: Treating Cornellius Kropp/Extender: Cassie Freer in Treatment: 11 Vital Signs Height(in): 66 Pulse(bpm): 82 Weight(lbs): 158 Blood Pressure(mmHg): 151/93 Body Mass Index(BMI): 25.5 Temperature(F): 97.5 Respiratory Rate(breaths/min): 18 [name], [name] ([id number]) [1:Photos:] [N/A:N/A] Right T Great oe N/A N/A Wound Location: Gradually Appeared N/A N/A Wounding Event: Atypical N/A N/A Primary Etiology: Hypertension, Rheumatoid Arthritis N/A N/A Comorbid History: 12/07/2022 N/A N/A Date Acquired: 11 N/A N/A Weeks of Treatment: Open N/A N/A Wound Status: No N/A N/A Wound Recurrence: 0.2x0.2x0.1 N/A N/A Measurements  L x W x D (cm) 0.031 N/A N/A A (cm) : rea 0.003 N/A N/A Volume (cm) : 56.30% N/A N/A % Reduction in A rea: 78.60% N/A N/A % Reduction in Volume: Full Thickness Without Exposed N/A N/A Classification: Support Structures Medium N/A N/A Exudate A mount: Serosanguineous N/A N/A Exudate Type: red, brown N/A N/A Exudate Color: Distinct, outline attached N/A N/A Wound Margin: Large (67-100%) N/A N/A Granulation A mount: Pink N/A N/A Granulation Quality: None Present (0%) N/A N/A Necrotic A mount: Fat Layer (Subcutaneous Tissue): Yes N/A N/A Exposed Structures: Fascia: No Tendon: No Muscle: No Joint: No Bone: No Medium (34-66%) N/A N/A Epithelialization: Debridement - Excisional N/A N/A Debridement: Pre-procedure Verification/Time Out 11:00 N/A N/A Taken: Lidocaine 4% Topical Solution N/A N/A Pain Control: Callus, Subcutaneous, Slough N/A N/A Tissue Debrided: Skin/Subcutaneous Tissue N/A N/A Level: 0.19 N/A N/A Debridement A (sq cm): rea Curette N/A N/A Instrument: Minimum N/A N/A Bleeding: Pressure N/A N/A Hemostasis A chieved: 0 N/A  N/A Procedural Pain: 0 N/A N/A Post Procedural Pain: Procedure was tolerated well N/A N/A Debridement Treatment Response: 0.3x0.4x0.1 N/A N/A Post Debridement Measurements L x W x D (cm) 0.009 N/A N/A Post Debridement Volume: (cm) Callus: Yes N/A N/A Periwound Skin Texture: No Abnormalities Noted N/A N/A Periwound Skin Moisture: No Abnormalities Noted N/A N/A Periwound Skin Color: No Abnormality N/A N/A Temperature: Debridement N/A N/A Procedures Performed: Treatment Notes Electronic Signature(s) Signed: 07/21/2023 12:24:20 PM By: Duanne Guess MD FACS Entered By: Duanne Guess on 07/21/2023 08:16:17 -------------------------------------------------------------------------------- Multi-Disciplinary Care Plan Details Patient Name: Date of Service: [name], [name] 07/21/2023 9:45 A M Medical Record Number: [id number] Patient Account Number: [id number] Date of Birth/Sex: Treating RN: 1956/05/22 (67 y.o. [name] Primary Care [place] Whedbee: [place] [facility] Other Clinician: [name] ([id number]) [id number] Page 4 of 7 Referring Avamae Dehaan: Treating Emilee Market/Extender: Cassie Freer in Treatment: 11 Multidisciplinary Care Plan reviewed with physician Active Inactive Wound/Skin Impairment Nursing Diagnoses: Impaired tissue integrity Knowledge deficit related to smoking impact on wound healing Knowledge deficit related to ulceration/compromised skin integrity Goals: Patient will demonstrate a reduced rate of smoking or cessation of smoking Date Initiated: 05/02/2023 Target Resolution Date: 08/18/2023 Goal Status: Active Patient/caregiver will verbalize understanding of skin care regimen Date Initiated: 05/02/2023 Target Resolution Date: 08/18/2023 Goal Status: Active Interventions: Assess patient/caregiver ability to obtain necessary supplies Assess patient/caregiver ability to perform ulcer/skin care regimen  upon admission and as needed Assess ulceration(s) every visit Provide education on smoking Treatment Activities: Referred to DME Rozann Holts for dressing supplies : 05/02/2023 Skin care regimen initiated : 05/02/2023 Topical wound management initiated : 05/02/2023 Notes: Electronic Signature(s) Signed: 07/21/2023 1:00:49 PM By: Zenaida Deed RN, BSN Entered By: Zenaida Deed on 07/21/2023 09:31:32 -------------------------------------------------------------------------------- Pain Assessment Details Patient Name: Date of Service: [name], [name] 07/21/2023 9:45 A M Medical Record Number: [id number] Patient Account Number: [id number] Date of Birth/Sex: Treating RN: [birth year] (67 y.o. F) Primary Care Leanda Padmore: [place] [facility] Other Clinician: Referring Fabian Walder: Treating Schon Zeiders/Extender: Cassie Freer in Treatment: 11 Active Problems Location of Pain Severity and Description of Pain Patient Has Paino No Site Locations [name], [name] ([id number]) [id number] Page 5 of 7 Pain Management and Medication Current Pain Management: Electronic Signature(s) Signed: 07/21/2023 12:40:33 PM By: Thayer Dallas Entered By: Thayer Dallas on 07/21/2023 07:49:24 -------------------------------------------------------------------------------- Patient/Caregiver Education Details Patient Name: Date of Service: [name] 12/27/2024andnbsp9:45 A M Medical Record Number: [id number] Patient Account Number: [id number] Date of Birth/Gender: Treating RN: 23-Jan-1956 (67 y.o. F) Zenaida Deed  Primary Care Physician: [place] [facility] Other Clinician: Referring Physician: Treating Physician/Extender: Cassie Freer in Treatment: 11 Education Assessment Education Provided To: Patient Education Topics Provided Offloading: Methods: Explain/Verbal Responses: Reinforcements needed, State content correctly Wound/Skin  Impairment: Methods: Explain/Verbal Responses: Reinforcements needed, State content correctly Electronic Signature(s) Signed: 07/21/2023 1:00:49 PM By: Zenaida Deed RN, BSN Entered By: Zenaida Deed on 07/21/2023 09:32:25 -------------------------------------------------------------------------------- Wound Assessment Details Patient Name: Date of Service: [name], [name] 07/21/2023 9:45 A Jacky Kindle, [name] ([id number]) [id number] Page 6 of 7 Medical Record Number: [id number] Patient Account Number: [id number] Date of Birth/Sex: Treating RN: Mar 02, 1956 (67 y.o. F) Primary Care Suhey Radford: [place] [facility] Other Clinician: Referring Blimie Vaness: Treating Ethelreda Sukhu/Extender: Cassie Freer in Treatment: 11 Wound Status Wound Number: 1 Primary Etiology: Atypical Wound Location: Right T Great oe Wound Status: Open Wounding Event: Gradually Appeared Comorbid History: Hypertension, Rheumatoid Arthritis Date Acquired: 12/07/2022 Weeks Of Treatment: 11 Clustered Wound: No Photos Wound Measurements Length: (cm) 0.2 Width: (cm) 0.2 Depth: (cm) 0.1 Area: (cm) 0.031 Volume: (cm) 0.003 % Reduction in Area: 56.3% % Reduction in Volume: 78.6% Epithelialization: Medium (34-66%) Tunneling: No Undermining: No Wound Description Classification: Full Thickness Without Exposed Support Structures Wound Margin: Distinct, outline attached Exudate Amount: Medium Exudate Type: Serosanguineous Exudate Color: red, brown Foul Odor After Cleansing: No Slough/Fibrino Yes Wound Bed Granulation Amount: Large (67-100%) Exposed Structure Granulation Quality: Pink Fascia Exposed: No Necrotic Amount: None Present (0%) Fat Layer (Subcutaneous Tissue) Exposed: Yes Tendon Exposed: No Muscle Exposed: No Joint Exposed: No Bone Exposed: No Periwound Skin Texture Texture Color No Abnormalities Noted: No No Abnormalities Noted: Yes Callus: Yes Temperature  / Pain Temperature: No Abnormality Moisture No Abnormalities Noted: Yes Treatment Notes Wound #1 (Toe Great) Wound Laterality: Right Cleanser Soap and Water Discharge Instruction: May shower and wash wound with dial antibacterial soap and water prior to dressing change. Wound Cleanser Discharge Instruction: Cleanse the wound with wound cleanser prior to applying a clean dressing using gauze sponges, not tissue or cotton balls. Peri-Wound Care Topical Primary Dressing IZONA, STUDE ([id number]) [id number] Page 7 of 7 Endoform 2x2 in Discharge Instruction: Moisten with saline Optifoam Non-Adhesive Dressing, 4x4 in Discharge Instruction: Cut out a "donut " shape to cushion Secondary Dressing Woven Gauze Sponges 2x2 in Discharge Instruction: Apply over primary dressing as directed. Secured With Conforming Stretch Gauze Bandage, Sterile 2x75 (in/in) Discharge Instruction: Secure with stretch gauze as directed. Paper Tape, 2x10 (in/yd) Discharge Instruction: Secure dressing with tape as directed. Compression Wrap Compression Stockings Add-Ons Electronic Signature(s) Signed: 07/21/2023 12:40:33 PM By: Thayer Dallas Entered By: Thayer Dallas on 07/21/2023 07:54:28 -------------------------------------------------------------------------------- Vitals Details Patient Name: Date of Service: [name], [name] 07/21/2023 9:45 A M Medical Record Number: [id number] Patient Account Number: [id number] Date of Birth/Sex: Treating RN: 30-Nov-1955 (66 y.o. F) Primary Care Marivel Mcclarty: [place] [facility] Other Clinician: Referring Dimitriy Carreras: Treating Ladoris Lythgoe/Extender: Cassie Freer in Treatment: 11 Vital Signs Time Taken: 10:48 Temperature (F): 97.5 Height (in): 66 Pulse (bpm): 82 Weight (lbs): 158 Respiratory Rate (breaths/min): 18 Body Mass Index (BMI): 25.5 Blood Pressure (mmHg): 151/93 Reference Range: 80 - 120 mg / dl Electronic  Signature(s) Signed: 07/21/2023 12:40:33 PM By: Thayer Dallas Entered By: Thayer Dallas on 07/21/2023 07:49:18

## 2023-07-21 NOTE — Progress Notes (Signed)
Tammy, Stanton Key ([id number]) [id number] Page 1 of 7 Visit Report for 07/21/2023 Chief Complaint Document Details Patient Name: Date of Service: [name], [name] 07/21/2023 9:45 A M Medical Record Number: [id number] Patient Account Number: [id number] Date of Birth/Sex: Treating RN: Oct 25, 1955 (67 y.o. F) Primary Care Provider: Cranford Mon Other Clinician: Referring Provider: Treating Provider/Extender: Cassie Freer in Treatment: 11 Information Obtained from: Patient Chief Complaint Patient seen for complaints of Non-Healing Wound. Electronic Signature(s) Signed: 07/21/2023 12:24:20 PM By: Duanne Guess MD FACS Entered By: Duanne Guess on 07/21/2023 78:29:56 -------------------------------------------------------------------------------- Debridement Details Patient Name: Date of Service: Tammy, Key 07/21/2023 9:45 A M Medical Record Number: [id number] Patient Account Number: [id number] Date of Birth/Sex: Treating RN: [birth year] (67 y.o. [name] Primary Care Provider: Cranford Mon Other Clinician: Referring Provider: Treating Provider/Extender: Cassie Freer in Treatment: 11 Debridement Performed for Assessment: Wound #1 Right T Great oe Performed By: Physician Duanne Guess, MD The following information was scribed by: Zenaida Deed The information was scribed for: Duanne Guess Debridement Type: Debridement Level of Consciousness (Pre-procedure): Awake and Alert Pre-procedure Verification/Time Out Yes - 11:00 Taken: Start Time: 11:00 Pain Control: Lidocaine 4% T opical Solution Percent of Wound Bed Debrided: 200% T Area Debrided (cm): otal 0.19 Tissue and other material debrided: Viable, Non-Viable, Callus, Slough, Subcutaneous, Skin: Epidermis, Slough Level: Skin/Subcutaneous Tissue Debridement Description: Excisional Instrument: Curette Bleeding:  Minimum Hemostasis Achieved: Pressure Procedural Pain: 0 Post Procedural Pain: 0 Response to Treatment: Procedure was tolerated well Level of Consciousness (Post- Awake and Alert procedure): Post Debridement Measurements of Total Wound Length: (cm) 0.3 Width: (cm) 0.4 Depth: (cm) 0.1 Volume: (cm) 0.009 Character of Wound/Ulcer Post Debridement: Improved [name] ([id number]) [id number] Page 2 of 7 Post Procedure Diagnosis Same as Pre-procedure Electronic Signature(s) Signed: 07/21/2023 12:24:20 PM By: Duanne Guess MD FACS Signed: 07/21/2023 1:00:49 PM By: Zenaida Deed RN, BSN Entered By: Zenaida Deed on 07/21/2023 08:03:28 -------------------------------------------------------------------------------- HPI Details Patient Name: Date of Service: Tammy, Key 07/21/2023 9:45 A M Medical Record Number: [id number] Patient Account Number: [id number] Date of Birth/Sex: Treating RN: [birth year] (67 y.o. F) Primary Care Provider: Cranford Mon Other Clinician: Referring Provider: Treating Provider/Extender: Cassie Freer in Treatment: 11 History of Present Illness HPI Description: ADMISSION ***ABI R: 1.15*** This is a 67 year old woman with a longstanding history of tobacco abuse, smoking a pack per day since the age of 4. She also has rheumatoid arthritis for which she receives immunomodulators. She was referred by podiatry for further evaluation and management of a right great toe ulcer. In May of this year, she had a callus debridement that revealed an open wound underlying the callus. It has apparently been managed with Betadine dressing changes and a surgical shoe for offloading. There was some question that she had peripheral neuropathy, but she underwent EMG testing that was negative for neuropathy and radiculopathy. The neurologist suggested that she undergo vascular studies, but I do not see that these  have been performed. Her most recent visit with podiatry was on September 30. The wound still had not healed as a result, she was referred to the wound care center for further evaluation and treatment. 05/12/2023: No significant change to the wound. There is a fair amount of callus accumulation around it and undermining that is created by her skin. There is slough on the surface. 05/19/2023: There has been no change to her wound. There is still callus buildup around the edges and some slough  on the surface. 05/24/2023: No significant change to her wound except that might be slightly shallower. 05/31/2023: The diameter/circumference of the wound are the same, but I think it is a little bit shallower. Callus continues to accumulate and encroach over the opening to the wound. She continues to smoke. 06/08/2023: The wound measured just the slightest bit shallower today, with the other dimensions remaining unchanged. 06/13/2023: The wound dimensions are about the same, but it appears that skin is growing down the sides of the wound to the base. There is a little slough on the surface. 06/20/2023: The wound is perhaps a tiny bit shallower. There is slough accumulation on the surface again. 07/21/2023: The wound is smaller today. It is surrounded by callus. Once this was debrided away, the depth is essentially nil. Electronic Signature(s) Signed: 07/21/2023 12:24:20 PM By: Duanne Guess MD FACS Entered By: Duanne Guess on 07/21/2023 08:16:56 -------------------------------------------------------------------------------- Physical Exam Details Patient Name: Date of Service: Tammy, Key 07/21/2023 9:45 A M Medical Record Number: [id number] Patient Account Number: [id number] Date of Birth/Sex: Treating RN: [birth year] (67 y.o. F) Primary Care Provider: Cranford Mon Other Clinician: Referring Provider: Treating Provider/Extender: Cassie Freer in Treatment:  [address] [place] ([id number]) [id number] Page 3 of 7 Constitutional Hypertensive, asymptomatic. . . . no acute distress. Respiratory Normal work of breathing on room air.. Notes 07/21/2023: The wound is smaller today. It is surrounded by callus. Once this was debrided away, the depth is essentially nil. Electronic Signature(s) Signed: 07/21/2023 12:24:20 PM By: Duanne Guess MD FACS Entered By: Duanne Guess on 07/21/2023 08:17:22 -------------------------------------------------------------------------------- Physician Orders Details Patient Name: Date of Service: MALEEA, FILIPOWICZ 07/21/2023 9:45 A M Medical Record Number: [id number] Patient Account Number: [id number] Date of Birth/Sex: Treating RN: [birth year] (67 y.o. [name] Primary Care Provider: Cranford Mon Other Clinician: Referring Provider: Treating Provider/Extender: Cassie Freer in Treatment: 11 The following information was scribed by: Zenaida Deed The information was scribed for: Duanne Guess Verbal / Phone Orders: No Diagnosis Coding ICD-10 Coding Code Description [phone] Non-pressure chronic ulcer of other part of right foot with fat layer exposed Z79.69 Long term (current) use of other immunomodulators and immunosuppressants Z72.0 Tobacco use Follow-up Appointments ppointment in 2 weeks. - Dr. Lady Gary Return A Anesthetic (In clinic) Topical Lidocaine 4% applied to wound bed Bathing/ Shower/ Hygiene May shower and wash wound with soap and water. Off-Loading Other: - forefoot offloading shoe to right foot Additional Orders / Instructions Stop/Decrease Smoking Follow Nutritious Diet - 70-100 grams of protein a day, 500 mg of vitamin C once a day and if tolerated then twice a day, zinc 30-50 mg a day Wound Treatment Wound #1 - T Great oe Wound Laterality: Right Cleanser: Soap and Water 1 x Per Day/15 Days Discharge Instructions:  May shower and wash wound with dial antibacterial soap and water prior to dressing change. Cleanser: Wound Cleanser 1 x Per Day/15 Days Discharge Instructions: Cleanse the wound with wound cleanser prior to applying a clean dressing using gauze sponges, not tissue or cotton balls. Prim Dressing: Endoform 2x2 in (Generic) 1 x Per Day/15 Days ary Discharge Instructions: Moisten with saline Prim Dressing: Optifoam Non-Adhesive Dressing, 4x4 in (Generic) 1 x Per Day/15 Days ary Discharge Instructions: Cut out a "donut " shape to cushion Secondary Dressing: Woven Gauze Sponges 2x2 in (Generic) 1 x Per Day/15 Days Discharge Instructions: Apply over primary dressing as directed. Secured With: Insurance underwriter, Sterile 2x75 (in/in) (Generic) 1  x Per Day/15 Days TASHIYA, KRUPA ([id number]) [id number].pdf Page 4 of 7 Discharge Instructions: Secure with stretch gauze as directed. Secured With: Paper Tape, 2x10 (in/yd) (Generic) 1 x Per Day/15 Days Discharge Instructions: Secure dressing with tape as directed. Electronic Signature(s) Signed: 07/21/2023 12:24:20 PM By: Duanne Guess MD FACS Entered By: Duanne Guess on 07/21/2023 08:17:33 -------------------------------------------------------------------------------- Problem List Details Patient Name: Date of Service: DILPREET, LOVINS 07/21/2023 9:45 A M Medical Record Number: [id number] Patient Account Number: [id number] Date of Birth/Sex: Treating RN: 29-Apr-1956 (67 y.o. [name] Primary Care Provider: Cranford Mon Other Clinician: Referring Provider: Treating Provider/Extender: Cassie Freer in Treatment: 11 Active Problems ICD-10 Encounter Code Description Active Date MDM Diagnosis L97.512 Non-pressure chronic ulcer of other part of right foot with fat layer exposed 05/02/2023 No Yes Z79.69 Long term (current) use of other immunomodulators and  immunosuppressants 05/02/2023 No Yes Z72.0 Tobacco use 05/02/2023 No Yes Inactive Problems Resolved Problems Electronic Signature(s) Signed: 07/21/2023 12:24:20 PM By: Duanne Guess MD FACS Entered By: Duanne Guess on 07/21/2023 08:16:12 -------------------------------------------------------------------------------- Progress Note Details Patient Name: Date of Service: TALYN, MESNARD 07/21/2023 9:45 A M Medical Record Number: [id number] Patient Account Number: [id number] Date of Birth/Sex: Treating RN: 06-09-1956 (67 y.o. F) Primary Care Provider: Cranford Mon Other Clinician: Referring Provider: Treating Provider/Extender: Cassie Freer in Treatment: [address] [place], [place] ([id number]) [id number] Page 5 of 7 Chief Complaint Information obtained from Patient Patient seen for complaints of Non-Healing Wound. History of Present Illness (HPI) ADMISSION ***ABI R: 1.15*** This is a 67 year old woman with a longstanding history of tobacco abuse, smoking a pack per day since the age of 77. She also has rheumatoid arthritis for which she receives immunomodulators. She was referred by podiatry for further evaluation and management of a right great toe ulcer. In May of this year, she had a callus debridement that revealed an open wound underlying the callus. It has apparently been managed with Betadine dressing changes and a surgical shoe for offloading. There was some question that she had peripheral neuropathy, but she underwent EMG testing that was negative for neuropathy and radiculopathy. The neurologist suggested that she undergo vascular studies, but I do not see that these have been performed. Her most recent visit with podiatry was on September 30. The wound still had not healed as a result, she was referred to the wound care center for further evaluation and treatment. 05/12/2023: No significant change to the wound.  There is a fair amount of callus accumulation around it and undermining that is created by her skin. There is slough on the surface. 05/19/2023: There has been no change to her wound. There is still callus buildup around the edges and some slough on the surface. 05/24/2023: No significant change to her wound except that might be slightly shallower. 05/31/2023: The diameter/circumference of the wound are the same, but I think it is a little bit shallower. Callus continues to accumulate and encroach over the opening to the wound. She continues to smoke. 06/08/2023: The wound measured just the slightest bit shallower today, with the other dimensions remaining unchanged. 06/13/2023: The wound dimensions are about the same, but it appears that skin is growing down the sides of the wound to the base. There is a little slough on the surface. 06/20/2023: The wound is perhaps a tiny bit shallower. There is slough accumulation on the surface again. 07/21/2023: The wound is smaller today. It is surrounded by callus. Once this was debrided  away, the depth is essentially nil. Objective Constitutional Hypertensive, asymptomatic. no acute distress. Vitals Time Taken: 10:48 AM, Height: 66 in, Weight: 158 lbs, BMI: 25.5, Temperature: 97.5 F, Pulse: 82 bpm, Respiratory Rate: 18 breaths/min, Blood Pressure: 151/93 mmHg. Respiratory Normal work of breathing on room air.. General Notes: 07/21/2023: The wound is smaller today. It is surrounded by callus. Once this was debrided away, the depth is essentially nil. Integumentary (Hair, Skin) Wound #1 status is Open. Original cause of wound was Gradually Appeared. The date acquired was: 12/07/2022. The wound has been in treatment 11 weeks. The wound is located on the Right T Great. The wound measures 0.2cm length x 0.2cm width x 0.1cm depth; 0.031cm^2 area and 0.003cm^3 volume. There is Fat oe Layer (Subcutaneous Tissue) exposed. There is no tunneling or undermining  noted. There is a medium amount of serosanguineous drainage noted. The wound margin is distinct with the outline attached to the wound base. There is large (67-100%) pink granulation within the wound bed. There is no necrotic tissue within the wound bed. The periwound skin appearance had no abnormalities noted for moisture. The periwound skin appearance had no abnormalities noted for color. The periwound skin appearance exhibited: Callus. Periwound temperature was noted as No Abnormality. Assessment Active Problems ICD-10 Non-pressure chronic ulcer of other part of right foot with fat layer exposed Long term (current) use of other immunomodulators and immunosuppressants Tobacco use Procedures Wound #1 Pre-procedure diagnosis of Wound #1 is an Atypical located on the Right T Great . There was a Excisional Skin/Subcutaneous Tissue Debridement with a total oe area of 0.19 sq cm performed by Duanne Guess, MD. With the following instrument(s): Curette to remove Viable and Non-Viable tissue/material. Material removed includes Callus, Subcutaneous Tissue, Slough, and Skin: Epidermis after achieving pain control using Lidocaine 4% Topical Solution. No specimens SARAHJANE, BEDNAREK ([id number]) [id number] Page 6 of 7 were taken. A time out was conducted at 11:00, prior to the start of the procedure. A Minimum amount of bleeding was controlled with Pressure. The procedure was tolerated well with a pain level of 0 throughout and a pain level of 0 following the procedure. Post Debridement Measurements: 0.3cm length x 0.4cm width x 0.1cm depth; 0.009cm^3 volume. Character of Wound/Ulcer Post Debridement is improved. Post procedure Diagnosis Wound #1: Same as Pre-Procedure Plan Follow-up Appointments: Return Appointment in 2 weeks. - Dr. Lady Gary Anesthetic: (In clinic) Topical Lidocaine 4% applied to wound bed Bathing/ Shower/ Hygiene: May shower and wash wound with soap  and water. Off-Loading: Other: - forefoot offloading shoe to right foot Additional Orders / Instructions: Stop/Decrease Smoking Follow Nutritious Diet - 70-100 grams of protein a day, 500 mg of vitamin C once a day and if tolerated then twice a day, zinc 30-50 mg a day WOUND #1: - T Great Wound Laterality: Right oe Cleanser: Soap and Water 1 x Per Day/15 Days Discharge Instructions: May shower and wash wound with dial antibacterial soap and water prior to dressing change. Cleanser: Wound Cleanser 1 x Per Day/15 Days Discharge Instructions: Cleanse the wound with wound cleanser prior to applying a clean dressing using gauze sponges, not tissue or cotton balls. Prim Dressing: Endoform 2x2 in (Generic) 1 x Per Day/15 Days ary Discharge Instructions: Moisten with saline Prim Dressing: Optifoam Non-Adhesive Dressing, 4x4 in (Generic) 1 x Per Day/15 Days ary Discharge Instructions: Cut out a "donut " shape to cushion Secondary Dressing: Woven Gauze Sponges 2x2 in (Generic) 1 x Per Day/15 Days Discharge Instructions: Apply over primary dressing as  directed. Secured With: Insurance underwriter, Sterile 2x75 (in/in) (Generic) 1 x Per Day/15 Days Discharge Instructions: Secure with stretch gauze as directed. Secured With: Paper T ape, 2x10 (in/yd) (Generic) 1 x Per Day/15 Days Discharge Instructions: Secure dressing with tape as directed. 07/21/2023: The wound is smaller today. It is surrounded by callus. Once this was debrided away, the depth is essentially nil. I used a curette to debride callus, slough, and subcutaneous tissue from the wound. She seems to be doing well with endoform, but I do not think she currently needs any topical gentamicin and mupirocin, so we will discontinue these. She will follow-up in 2 weeks. Electronic Signature(s) Signed: 07/21/2023 12:24:20 PM By: Duanne Guess MD FACS Entered By: Duanne Guess on 07/21/2023  08:20:38 -------------------------------------------------------------------------------- SuperBill Details Patient Name: Date of Service: TIESHA, YOX 07/21/2023 Medical Record Number: [id number] Patient Account Number: [id number] Date of Birth/Sex: Treating RN: 1956/01/11 (67 y.o. F) Primary Care Provider: Cranford Mon Other Clinician: Referring Provider: Treating Provider/Extender: Cassie Freer in Treatment: 11 Diagnosis Coding ICD-10 Codes Code Description [phone] Non-pressure chronic ulcer of other part of right foot with fat layer exposed Z79.69 Long term (current) use of other immunomodulators and immunosuppressants Z72.0 Tobacco use Facility Procedures MAANASA, AUGUSTE ([id number]): CPT4 Code Description [id number] 11042 - DEB SUBQ TISSUE 20 SQ CM/< ICD-10 Diagnosis Description L97.512 Non-pressure chronic ulcer of other part of right foot with fat Z79.69 Long term (current) use of other  immunomodulators and immunosupp Z72.0 Tobacco use [phone].pdf Page 7 of 7: Modifier Quantity 1 layer exposed ressants Physician Procedures : CPT4 Code Description Modifier [id number] - WC PHYS SUBQ TISS 20 SQ CM ICD-10 Diagnosis Description L97.512 Non-pressure chronic ulcer of other part of right foot with fat layer exposed Z79.69 Long term (current) use of other immunomodulators and  immunosuppressants Z72.0 Tobacco use Quantity: 1 Electronic Signature(s) Signed: 07/21/2023 12:24:20 PM By: Duanne Guess MD FACS Entered By: Duanne Guess on 07/21/2023 08:20:54

## 2023-07-27 ENCOUNTER — Other Ambulatory Visit (HOSPITAL_COMMUNITY): Payer: Self-pay | Admitting: Internal Medicine

## 2023-07-27 DIAGNOSIS — Z87891 Personal history of nicotine dependence: Secondary | ICD-10-CM

## 2023-07-27 DIAGNOSIS — F17200 Nicotine dependence, unspecified, uncomplicated: Secondary | ICD-10-CM

## 2023-08-03 ENCOUNTER — Encounter (HOSPITAL_BASED_OUTPATIENT_CLINIC_OR_DEPARTMENT_OTHER): Payer: Medicare Other | Attending: General Surgery | Admitting: General Surgery

## 2023-08-03 DIAGNOSIS — L97512 Non-pressure chronic ulcer of other part of right foot with fat layer exposed: Secondary | ICD-10-CM | POA: Diagnosis not present

## 2023-08-03 DIAGNOSIS — S91101A Unspecified open wound of right great toe without damage to nail, initial encounter: Secondary | ICD-10-CM | POA: Diagnosis not present

## 2023-08-03 DIAGNOSIS — Z7969 Long term (current) use of other immunomodulators and immunosuppressants: Secondary | ICD-10-CM | POA: Insufficient documentation

## 2023-08-03 DIAGNOSIS — F17200 Nicotine dependence, unspecified, uncomplicated: Secondary | ICD-10-CM | POA: Insufficient documentation

## 2023-08-03 NOTE — Progress Notes (Signed)
 [name], [name] ([id number]) [id number] Page 1 of 5 Visit Report for 08/03/2023 Chief Complaint Document Details Patient Name: Date of Service: [name], [name] 08/03/2023 3:00 PM Medical Record Number: [id number] Patient Account Number: [id number] Date of Birth/Sex: Treating RN: 11-04-1955 (68 y.o. F) Primary Care Provider: Sheryle Gaither Key Other Clinician: Referring Provider: Treating Provider/Extender: [name] [name] [name] in Treatment: 13 Information Obtained from: Patient Chief Complaint Patient seen for complaints of Non-Healing Wound. Electronic Signature(s) Signed: 08/03/2023 3:03:05 PM By: [name] Entered By: [name] on 08/03/2023 12:03:05 -------------------------------------------------------------------------------- HPI Details Patient Name: Date of Service: [name], [name] 08/03/2023 3:00 PM Medical Record Number: [id number] Patient Account Number: [id number] Date of Birth/Sex: Treating RN: [birth year] (68 y.o. F) Primary Care Provider: Sheryle Gaither Key Other Clinician: Referring Provider: Treating Provider/Extender: [name] [name] [name] in Treatment: 13 History of Present Illness HPI Description: ADMISSION ***ABI R: 1.15*** This is a 68 year old woman with a longstanding history of tobacco abuse, smoking a pack per day since the age of 61. She also has rheumatoid arthritis for which she receives immunomodulators. She was referred by podiatry for further evaluation and management of a right great toe ulcer. In May of this year, she had a callus debridement that revealed an open wound underlying the callus. It has apparently been managed with Betadine dressing changes and a surgical shoe for offloading. There was some question that she had peripheral neuropathy, but she underwent EMG testing that was negative for neuropathy and radiculopathy. The neurologist suggested that she undergo vascular  studies, but I do not see that these have been performed. Her most recent visit with podiatry was on September 30. The wound still had not healed as a result, she was referred to the wound care center for further evaluation and treatment. 05/12/2023: No significant change to the wound. There is a fair amount of callus accumulation around it and undermining that is created by her skin. There is slough on the surface. 05/19/2023: There has been no change to her wound. There is still callus buildup around the edges and some slough on the surface. 05/24/2023: No significant change to her wound except that might be slightly shallower. 05/31/2023: The diameter/circumference of the wound are the same, but I think it is a little bit shallower. Callus continues to accumulate and encroach over the opening to the wound. She continues to smoke. 06/08/2023: The wound measured just the slightest bit shallower today, with the other dimensions remaining unchanged. 06/13/2023: The wound dimensions are about the same, but it appears that skin is growing down the sides of the wound to the base. There is a little slough on the surface. 06/20/2023: The wound is perhaps a tiny bit shallower. There is slough accumulation on the surface again. 07/21/2023: The wound is smaller today. It is surrounded by callus. Once this was debrided away, the depth is essentially nil. 08/03/2023: Her wound is healed. [name], [name] ([id number]) [id number] Page 2 of 5 Electronic Signature(s) Signed: 08/03/2023 3:03:21 PM By: [name] Entered By: [name] on 08/03/2023 12:03:20 -------------------------------------------------------------------------------- Physical Exam Details Patient Name: Date of Service: [name], [name] 08/03/2023 3:00 PM Medical Record Number: [id number] Patient Account Number: [id number] Date of Birth/Sex: Treating RN: Dec 22, 1955 (68 y.o. F) Primary Care  Provider: Sheryle Gaither Key Other Clinician: Referring Provider: Treating Provider/Extender: [name] [name] Weeks in Treatment: 13 Constitutional . . . . no acute distress. Respiratory Normal work of breathing on room air.. Notes 08/03/2023: Her wound  is healed. Electronic Signature(s) Signed: 08/03/2023 3:03:51 PM By: [name] Entered By: [name] on 08/03/2023 12:03:51 -------------------------------------------------------------------------------- Physician Orders Details Patient Name: Date of Service: [name], [name] 08/03/2023 3:00 PM Medical Record Number: [id number] Patient Account Number: [id number] Date of Birth/Sex: Treating RN: [birth year] (68 y.o. [name] Primary Care Provider: Sheryle Gaither Key Other Clinician: Referring Provider: Treating Provider/Extender: [name] [name] [name] in Treatment: 13 The following information was scribed by: [name] The information was scribed for: [name] Verbal / Phone Orders: No Diagnosis Coding ICD-10 Coding Code Description [id number] Non-pressure chronic ulcer of other part of right foot with fat layer exposed Z79.69 Long term (current) use of other immunomodulators and immunosuppressants Z72.0 Tobacco use Discharge From [facility] Services Discharge from Wound Care Center Bathing/ Shower/ Hygiene May shower and wash wound with soap and water . Off-Loading Other: - callous cushion to right great toe daily to protect Additional Orders / Instructions Stop/Decrease Smoking [name], [name] ([id number]) [id number] Page 3 of 5 Electronic Signature(s) Signed: 08/03/2023 3:08:08 PM By: [name] Entered By: [name] on 08/03/2023 12:07:25 -------------------------------------------------------------------------------- Problem List Details Patient Name: Date of Service: [name], [name] 08/03/2023 3:00 PM Medical Record  Number: [id number] Patient Account Number: [id number] Date of Birth/Sex: Treating RN: May 22, 1956 (68 y.o. [name] Primary Care Provider: Sheryle Gaither Key Other Clinician: Referring Provider: Treating Provider/Extender: [name] [name] [name] in Treatment: 13 Active Problems ICD-10 Encounter Code Description Active Date MDM Diagnosis [id number] Non-pressure chronic ulcer of other part of right foot with fat layer exposed 05/02/2023 No Yes Z79.69 Long term (current) use of other immunomodulators and immunosuppressants 05/02/2023 No Yes Z72.0 Tobacco use 05/02/2023 No Yes Inactive Problems Resolved Problems Electronic Signature(s) Signed: 08/03/2023 3:02:56 PM By: [name] Entered By: [name] on 08/03/2023 12:02:55 -------------------------------------------------------------------------------- Progress Note Details Patient Name: Date of Service: [name], [name] 08/03/2023 3:00 PM Medical Record Number: [id number] Patient Account Number: [id number] Date of Birth/Sex: Treating RN: 04-11-1956 (68 y.o. F) Primary Care Provider: Sheryle Gaither Key Other Clinician: Referring Provider: Treating Provider/Extender: [name] [name] [name] in Treatment: 13 Subjective Chief Complaint Information obtained from Patient Patient seen for complaints of Non-Healing Wound. History of Present Illness (HPI) ADMISSION [name], [name] ([id number]) [id number] Page 4 of 5 ***ABI R: 1.15*** This is a 68 year old woman with a longstanding history of tobacco abuse, smoking a pack per day since the age of 89. She also has rheumatoid arthritis for which she receives immunomodulators. She was referred by podiatry for further evaluation and management of a right great toe ulcer. In May of this year, she had a callus debridement that revealed an open wound underlying the callus. It has apparently been managed with Betadine dressing  changes and a surgical shoe for offloading. There was some question that she had peripheral neuropathy, but she underwent EMG testing that was negative for neuropathy and radiculopathy. The neurologist suggested that she undergo vascular studies, but I do not see that these have been performed. Her most recent visit with podiatry was on September 30. The wound still had not healed as a result, she was referred to the wound care center for further evaluation and treatment. 05/12/2023: No significant change to the wound. There is a fair amount of callus accumulation around it and undermining that is created by her skin. There is slough on the surface. 05/19/2023: There has been no change to her wound. There is still callus buildup around the edges and  some slough on the surface. 05/24/2023: No significant change to her wound except that might be slightly shallower. 05/31/2023: The diameter/circumference of the wound are the same, but I think it is a little bit shallower. Callus continues to accumulate and encroach over the opening to the wound. She continues to smoke. 06/08/2023: The wound measured just the slightest bit shallower today, with the other dimensions remaining unchanged. 06/13/2023: The wound dimensions are about the same, but it appears that skin is growing down the sides of the wound to the base. There is a little slough on the surface. 06/20/2023: The wound is perhaps a tiny bit shallower. There is slough accumulation on the surface again. 07/21/2023: The wound is smaller today. It is surrounded by callus. Once this was debrided away, the depth is essentially nil. 08/03/2023: Her wound is healed. Objective Constitutional no acute distress. Vitals Time Taken: 2:43 AM, Height: 66 in, Weight: 158 lbs, BMI: 25.5, Temperature: 97.9 F, Pulse: 95 bpm, Respiratory Rate: 20 breaths/min, Blood Pressure: 135/80 mmHg. Respiratory Normal work of breathing on room air.. General Notes: 08/03/2023:  Her wound is healed. Integumentary (Hair, Skin) Wound #1 status is Open. Original cause of wound was Gradually Appeared. The date acquired was: 12/07/2022. The wound has been in treatment 13 weeks. The wound is located on the Right T Great. The wound measures 0cm length x 0cm width x 0cm depth; 0cm^2 area and 0cm^3 volume. There is no tunneling oe or undermining noted. There is a none present amount of drainage noted. There is no granulation within the wound bed. There is no necrotic tissue within the wound bed. The periwound skin appearance had no abnormalities noted for texture. The periwound skin appearance had no abnormalities noted for moisture. The periwound skin appearance had no abnormalities noted for color. Periwound temperature was noted as No Abnormality. Assessment Active Problems ICD-10 Non-pressure chronic ulcer of other part of right foot with fat layer exposed Long term (current) use of other immunomodulators and immunosuppressants Tobacco use Plan Discharge From [facility] Services: Discharge from Wound Care Center Bathing/ Shower/ Hygiene: May shower and wash wound with soap and water . Off-Loading: Other: - callous cushion to right great toe daily to protect Additional Orders / Instructions: Stop/Decrease Smoking [name] ([id number]) [id number] Page 5 of 5 08/03/2023: Her wound is healed. We will discharge her from the wound care center. She may follow-up in the future, should the need arise. Electronic Signature(s) Signed: 08/03/2023 3:07:42 PM By: [name] Entered By: [name] on 08/03/2023 12:07:42 -------------------------------------------------------------------------------- SuperBill Details Patient Name: Date of Service: [name], DILORETO 08/03/2023 Medical Record Number: [id number] Patient Account Number: [id number] Date of Birth/Sex: Treating RN: 1955-10-04 (68 y.o. [name] Primary Care  Provider: Sheryle Gaither Key Other Clinician: Referring Provider: Treating Provider/Extender: [name] [name] [name] in Treatment: 13 Diagnosis Coding ICD-10 Codes Code Description [id number] Non-pressure chronic ulcer of other part of right foot with fat layer exposed Z79.69 Long term (current) use of other immunomodulators and immunosuppressants Z72.0 Tobacco use Facility Procedures : CPT4 Code: [id number] Description: 99213 - WOUND CARE VISIT-LEV 3 EST PT Modifier: Quantity: 1 Physician Procedures : CPT4 Code Description Modifier [id number] - WC PHYS LEVEL 2 - EST PT ICD-10 Diagnosis Description L97.512 Non-pressure chronic ulcer of other part of right foot with fat layer exposed Z79.69 Long term (current) use of other immunomodulators and  immunosuppressants Z72.0 Tobacco use Quantity: 1 Electronic Signature(s) Signed: 08/03/2023 3:07:56 PM By: [name] Entered By: Tammy,  Desirea Mizrahi on 08/03/2023 12:07:56

## 2023-08-03 NOTE — Progress Notes (Signed)
 [name], [name] ([id number]) [id number] Page 1 of 7 Visit Report for 08/03/2023 Arrival Information Details Patient Name: Date of Service: [name], [name] 08/03/2023 3:00 PM Medical Record Number: [id number] Patient Account Number: [id number] Date of Birth/Sex: Treating RN: May 19, 1956 (68 y.o. F) Primary Care Gracyn Allor: Sheryle Gaither KIDD Other Clinician: Referring Eathel Pajak: Treating Chevie Birkhead/Extender: Marolyn Delon Sheryle Gaither KIDD Devra in Treatment: 13 Visit Information History Since Last Visit Added or deleted any medications: No Patient Arrived: Ambulatory Any new allergies or adverse reactions: No Arrival Time: 14:42 Had a fall or experienced change in No Accompanied By: husband activities of daily living that may affect Transfer Assistance: None risk of falls: Patient Identification Verified: Yes Signs or symptoms of abuse/neglect since last visito No Secondary Verification Process Completed: Yes Hospitalized since last visit: No Patient Requires Transmission-Based Precautions: No Implantable device outside of the clinic excluding No Patient Has Alerts: No cellular tissue based products placed in the center since last visit: Has Dressing in Place as Prescribed: Yes Pain Present Now: No Electronic Signature(s) Signed: 08/03/2023 4:46:16 PM By: Merleen Handing RN, BSN Entered By: Boehlein, Linda on 08/03/2023 14:51:19 -------------------------------------------------------------------------------- Clinic Level of Care Assessment Details Patient Name: Date of Service: FARRYN, LINARES 08/03/2023 3:00 PM Medical Record Number: [id number] Patient Account Number: [id number] Date of Birth/Sex: Treating RN: 07-15-1956 (68 y.o. [name] Primary Care Bobby Ragan: Sheryle Gaither KIDD Other Clinician: Referring Mohamud Mrozek: Treating Lovie Zarling/Extender: Marolyn Delon Sheryle Gaither KIDD Devra in Treatment: 13 Clinic Level of Care Assessment Items TOOL 4 Quantity  Score []  - 0 Use when only an EandM is performed on FOLLOW-UP visit ASSESSMENTS - Nursing Assessment / Reassessment X- 1 10 Reassessment of Co-morbidities (includes updates in patient status) X- 1 5 Reassessment of Adherence to Treatment Plan ASSESSMENTS - Wound and Skin A ssessment / Reassessment X - Simple Wound Assessment / Reassessment - one wound 1 5 []  - 0 Complex Wound Assessment / Reassessment - multiple wounds []  - 0 Dermatologic / Skin Assessment (not related to wound area) ASSESSMENTS - Focused Assessment []  - 0 Circumferential Edema Measurements - multi extremities []  - 0 Nutritional Assessment / Counseling / Intervention [place], [name] ([id number]) [id number] Page 2 of 7 X- 1 5 Lower Extremity Assessment (monofilament, tuning fork, pulses) []  - 0 Peripheral Arterial Disease Assessment (using hand held doppler) ASSESSMENTS - Ostomy and/or Continence Assessment and Care []  - 0 Incontinence Assessment and Management []  - 0 Ostomy Care Assessment and Management (repouching, etc.) PROCESS - Coordination of Care X - Simple Patient / Family Education for ongoing care 1 15 []  - 0 Complex (extensive) Patient / Family Education for ongoing care X- 1 10 Staff obtains Chiropractor, Records, T Results / Process Orders est []  - 0 Staff telephones HHA, Nursing Homes / Clarify orders / etc []  - 0 Routine Transfer to another Facility (non-emergent condition) []  - 0 Routine Hospital Admission (non-emergent condition) []  - 0 New Admissions / Manufacturing Engineer / Ordering NPWT Apligraf, etc. , []  - 0 Emergency Hospital Admission (emergent condition) X- 1 10 Simple Discharge Coordination []  - 0 Complex (extensive) Discharge Coordination PROCESS - Special Needs []  - 0 Pediatric / Minor Patient Management []  - 0 Isolation Patient Management []  - 0 Hearing / Language / Visual special needs []  - 0 Assessment of Community assistance  (transportation, D/C planning, etc.) []  - 0 Additional assistance / Altered mentation []  - 0 Support Surface(s) Assessment (bed, cushion, seat, etc.) INTERVENTIONS - Wound Cleansing / Measurement X - Simple Wound Cleansing - one wound 1  5 []  - 0 Complex Wound Cleansing - multiple wounds X- 1 5 Wound Imaging (photographs - any number of wounds) []  - 0 Wound Tracing (instead of photographs) []  - 0 Simple Wound Measurement - one wound []  - 0 Complex Wound Measurement - multiple wounds INTERVENTIONS - Wound Dressings X - Small Wound Dressing one or multiple wounds 1 10 []  - 0 Medium Wound Dressing one or multiple wounds []  - 0 Large Wound Dressing one or multiple wounds []  - 0 Application of Medications - topical []  - 0 Application of Medications - injection INTERVENTIONS - Miscellaneous []  - 0 External ear exam []  - 0 Specimen Collection (cultures, biopsies, blood, body fluids, etc.) []  - 0 Specimen(s) / Culture(s) sent or taken to Lab for analysis []  - 0 Patient Transfer (multiple staff / Nurse, Adult / Similar devices) []  - 0 Simple Staple / Suture removal (25 or less) []  - 0 Complex Staple / Suture removal (26 or more) []  - 0 Hypo / Hyperglycemic Management (close monitor of Blood Glucose) [name], [name] ([id number]) [id number] Page 3 of 7 []  - 0 Ankle / Brachial Index (ABI) - do not check if billed separately X- 1 5 Vital Signs Has the patient been seen at the hospital within the last three years: Yes Total Score: 85 Level Of Care: New/Established - Level 3 Electronic Signature(s) Signed: 08/03/2023 4:46:16 PM By: Merleen Handing RN, BSN Entered By: Merleen Handing on 08/03/2023 15:03:42 -------------------------------------------------------------------------------- Lower Extremity Assessment Details Patient Name: Date of Service: [name], [name] 08/03/2023 3:00 PM Medical Record Number: [id number] Patient Account Number:  [id number] Date of Birth/Sex: Treating RN: 03-03-1956 (68 y.o. [name] Primary Care Roshun Klingensmith: Sheryle Gaither KIDD Other Clinician: Referring Paul Trettin: Treating Kylie Simmonds/Extender: Marolyn Delon Sheryle Gaither KIDD Devra in Treatment: 13 Edema Assessment Assessed: [Left: No] [Right: No] Edema: [Left: N] [Right: o] Calf Left: Right: Point of Measurement: From Medial Instep 37.1 cm Ankle Left: Right: Point of Measurement: From Medial Instep 23.1 cm Vascular Assessment Pulses: Dorsalis Pedis Palpable: [Right:Yes] Extremity colors, hair growth, and conditions: Extremity Color: [Right:Normal] Hair Growth on Extremity: [Right:Yes] Temperature of Extremity: [Right:Warm] Capillary Refill: [Right:< 3 seconds] Dependent Rubor: [Right:No No] Electronic Signature(s) Signed: 08/03/2023 4:46:16 PM By: Merleen Handing RN, BSN Entered By: Merleen Handing on 08/03/2023 14:52:04 -------------------------------------------------------------------------------- Multi Wound Chart Details Patient Name: Date of Service: [name], [name] 08/03/2023 3:00 PM Medical Record Number: [id number] Patient Account Number: [id number] Date of Birth/Sex: Treating RN: 03/14/1956 (68 y.o. F) Primary Care Shyla Gayheart: Sheryle Gaither KIDD Other Clinician: Referring Reyah Streeter: Treating Zakariya Knickerbocker/Extender: Marolyn Delon Sheryle Gaither KIDD Devra in Treatment: [address], [place] ([id number]) [id number] Page 4 of 7 Vital Signs Height(in): 66 Pulse(bpm): 95 Weight(lbs): 158 Blood Pressure(mmHg): 135/80 Body Mass Index(BMI): 25.5 Temperature(F): 97.9 Respiratory Rate(breaths/min): 20 [1:Photos:] [N/A:N/A] Right T Great oe N/A N/A Wound Location: Gradually Appeared N/A N/A Wounding Event: Atypical N/A N/A Primary Etiology: Hypertension, Rheumatoid Arthritis N/A N/A Comorbid History: 12/07/2022 N/A N/A Date Acquired: 24 N/A N/A Weeks of Treatment: Open N/A N/A Wound Status: No N/A N/A Wound  Recurrence: 0x0x0 N/A N/A Measurements L x W x D (cm) 0 N/A N/A A (cm) : rea 0 N/A N/A Volume (cm) : 100.00% N/A N/A % Reduction in A rea: 100.00% N/A N/A % Reduction in Volume: Full Thickness Without Exposed N/A N/A Classification: Support Structures None Present N/A N/A Exudate Amount: None Present (0%) N/A N/A Granulation Amount: None Present (0%) N/A N/A Necrotic Amount: Fascia: No N/A N/A Exposed Structures: Fat Layer (Subcutaneous Tissue): No Tendon:  No Muscle: No Joint: No Bone: No Large (67-100%) N/A N/A Epithelialization: Callus: No N/A N/A Periwound Skin Texture: No Abnormalities Noted N/A N/A Periwound Skin Moisture: No Abnormalities Noted N/A N/A Periwound Skin Color: No Abnormality N/A N/A Temperature: Treatment Notes Electronic Signature(s) Signed: 08/03/2023 3:03:00 PM By: Marolyn Nest MD FACS Entered By: Marolyn Nest on 08/03/2023 15:03:00 -------------------------------------------------------------------------------- Multi-Disciplinary Care Plan Details Patient Name: Date of Service: [name], [name] 08/03/2023 3:00 PM Medical Record Number: [id number] Patient Account Number: [id number] Date of Birth/Sex: Treating RN: 1955-08-26 (68 y.o. [name] Primary Care Beck Cofer: Sheryle Gaither KIDD Other Clinician: Referring Shawnetta Lein: Treating Cady Hafen/Extender: Marolyn Nest Sheryle Gaither KIDD Devra in Treatment: 13 Multidisciplinary Care Plan reviewed with physician [address] [place], [place] ([id number]) [id number] Page 5 of 7 Electronic Signature(s) Signed: 08/03/2023 4:46:16 PM By: Merleen Handing RN, BSN Entered By: Boehlein, Linda on 08/03/2023 14:55:14 -------------------------------------------------------------------------------- Pain Assessment Details Patient Name: Date of Service: [name], [name] 08/03/2023 3:00 PM Medical Record Number: [id number] Patient Account Number: [id number] Date of  Birth/Sex: Treating RN: Jan 29, 1956 (68 y.o. F) Primary Care Hideo Googe: Sheryle Gaither KIDD Other Clinician: Referring Kamree Wiens: Treating Roniya Tetro/Extender: Marolyn Nest Sheryle Gaither KIDD Devra in Treatment: 13 Active Problems Location of Pain Severity and Description of Pain Patient Has Paino No Site Locations Pain Management and Medication Current Pain Management: Electronic Signature(s) Signed: 08/03/2023 4:46:16 PM By: Merleen Handing RN, BSN Entered By: Merleen Handing on 08/03/2023 14:51:28 -------------------------------------------------------------------------------- Patient/Caregiver Education Details Patient Name: Date of Service: [name] 1/9/2025andnbsp3:00 PM Medical Record Number: [id number] Patient Account Number: [id number] Date of Birth/Gender: Treating RN: 11-19-1955 (68 y.o. [name] Primary Care Physician: Sheryle Gaither KIDD Other Clinician: Referring Physician: Treating Physician/Extender: Marolyn Nest Sheryle Gaither KIDD Devra in Treatment: 13 Education Assessment Education Provided To: [name] ([id number]) [id number] Page 6 of 7 Patient Education Topics Provided Offloading: Methods: Explain/Verbal Responses: Reinforcements needed, State content correctly Wound/Skin Impairment: Methods: Explain/Verbal Responses: Reinforcements needed, State content correctly Electronic Signature(s) Signed: 08/03/2023 4:46:16 PM By: Merleen Handing RN, BSN Entered By: Boehlein, Linda on 08/03/2023 15:03:08 -------------------------------------------------------------------------------- Wound Assessment Details Patient Name: Date of Service: [name], [name] 08/03/2023 3:00 PM Medical Record Number: [id number] Patient Account Number: [id number] Date of Birth/Sex: Treating RN: 1955-09-08 (68 y.o. [name] Primary Care Mukhtar Shams: Sheryle Gaither KIDD Other Clinician: Referring Wanza Szumski: Treating Terricka Onofrio/Extender: Marolyn Nest Sheryle Gaither KIDD Devra in Treatment: 13 Wound Status Wound Number: 1 Primary Etiology: Atypical Wound Location: Right T Great oe Wound Status: Open Wounding Event: Gradually Appeared Comorbid History: Hypertension, Rheumatoid Arthritis Date Acquired: 12/07/2022 Weeks Of Treatment: 13 Clustered Wound: No Photos Wound Measurements Length: (cm) Width: (cm) Depth: (cm) Area: (cm) Volume: (cm) 0 % Reduction in Area: 100% 0 % Reduction in Volume: 100% 0 Epithelialization: Large (67-100%) 0 Tunneling: No 0 Undermining: No Wound Description Classification: Full Thickness Without Exposed Support Exudate Amount: None Present Structures Foul Odor After Cleansing: No Slough/Fibrino No Wound Bed Granulation Amount: None Present (0%) Exposed Structure Necrotic Amount: None Present (0%) Fascia Exposed: No Fat Layer (Subcutaneous Tissue) Exposed: No Tendon Exposed: No Muscle Exposed: No [name] ([id number]) [id number] Page 7 of 7 Joint Exposed: No Bone Exposed: No Periwound Skin Texture Texture Color No Abnormalities Noted: Yes No Abnormalities Noted: Yes Moisture Temperature / Pain No Abnormalities Noted: Yes Temperature: No Abnormality Electronic Signature(s) Signed: 08/03/2023 4:46:16 PM By: Merleen Handing RN, BSN Entered By: Merleen Handing on 08/03/2023 14:52:33 -------------------------------------------------------------------------------- Vitals Details Patient Name: Date of Service: [name] 08/03/2023 3:00 PM Medical Record Number: [id number] Patient Account  Number: [id number] Date of Birth/Sex: Treating RN: 1956-02-04 (68 y.o. F) Primary Care Cashis Rill: Sheryle Gaither KIDD Other Clinician: Referring Amsi Grimley: Treating Zaharah Amir/Extender: Marolyn Delon Sheryle Gaither KIDD Devra in Treatment: 13 Vital Signs Time Taken: 02:43 Temperature (F): 97.9 Height (in): 66 Pulse (bpm): 95 Weight (lbs): 158 Respiratory Rate  (breaths/min): 20 Body Mass Index (BMI): 25.5 Blood Pressure (mmHg): 135/80 Reference Range: 80 - 120 mg / dl Electronic Signature(s) Signed: 08/03/2023 4:46:16 PM By: Merleen Handing RN, BSN Entered By: Boehlein, Linda on 08/03/2023 14:51:24

## 2023-08-04 ENCOUNTER — Ambulatory Visit (HOSPITAL_COMMUNITY): Payer: Medicare Other

## 2023-08-17 ENCOUNTER — Ambulatory Visit (HOSPITAL_BASED_OUTPATIENT_CLINIC_OR_DEPARTMENT_OTHER): Payer: Medicare Other | Admitting: General Surgery

## 2023-08-19 DIAGNOSIS — Z6827 Body mass index (BMI) 27.0-27.9, adult: Secondary | ICD-10-CM | POA: Diagnosis not present

## 2023-08-19 DIAGNOSIS — I10 Essential (primary) hypertension: Secondary | ICD-10-CM | POA: Diagnosis not present

## 2023-08-19 DIAGNOSIS — R059 Cough, unspecified: Secondary | ICD-10-CM | POA: Diagnosis not present

## 2023-08-19 DIAGNOSIS — J019 Acute sinusitis, unspecified: Secondary | ICD-10-CM | POA: Diagnosis not present

## 2023-08-19 DIAGNOSIS — E663 Overweight: Secondary | ICD-10-CM | POA: Diagnosis not present

## 2023-09-05 DIAGNOSIS — E663 Overweight: Secondary | ICD-10-CM | POA: Diagnosis not present

## 2023-09-05 DIAGNOSIS — Z111 Encounter for screening for respiratory tuberculosis: Secondary | ICD-10-CM | POA: Diagnosis not present

## 2023-09-05 DIAGNOSIS — Z6827 Body mass index (BMI) 27.0-27.9, adult: Secondary | ICD-10-CM | POA: Diagnosis not present

## 2023-09-05 DIAGNOSIS — E538 Deficiency of other specified B group vitamins: Secondary | ICD-10-CM | POA: Diagnosis not present

## 2023-09-05 DIAGNOSIS — Z79899 Other long term (current) drug therapy: Secondary | ICD-10-CM | POA: Diagnosis not present

## 2023-09-05 DIAGNOSIS — M0579 Rheumatoid arthritis with rheumatoid factor of multiple sites without organ or systems involvement: Secondary | ICD-10-CM | POA: Diagnosis not present

## 2023-09-05 DIAGNOSIS — R5383 Other fatigue: Secondary | ICD-10-CM | POA: Diagnosis not present

## 2023-09-05 DIAGNOSIS — G629 Polyneuropathy, unspecified: Secondary | ICD-10-CM | POA: Diagnosis not present

## 2023-09-12 ENCOUNTER — Ambulatory Visit (HOSPITAL_COMMUNITY)
Admission: RE | Admit: 2023-09-12 | Discharge: 2023-09-12 | Disposition: A | Discharge status: Home / Self Care | Payer: Medicare Other | Source: Ambulatory Visit | Attending: Internal Medicine | Admitting: Internal Medicine

## 2023-09-12 DIAGNOSIS — F17200 Nicotine dependence, unspecified, uncomplicated: Secondary | ICD-10-CM | POA: Diagnosis not present

## 2023-09-12 DIAGNOSIS — Z87891 Personal history of nicotine dependence: Secondary | ICD-10-CM | POA: Insufficient documentation

## 2023-09-12 DIAGNOSIS — F17210 Nicotine dependence, cigarettes, uncomplicated: Secondary | ICD-10-CM | POA: Diagnosis not present

## 2023-09-20 DIAGNOSIS — M0589 Other rheumatoid arthritis with rheumatoid factor of multiple sites: Secondary | ICD-10-CM | POA: Diagnosis not present

## 2023-10-18 DIAGNOSIS — M0589 Other rheumatoid arthritis with rheumatoid factor of multiple sites: Secondary | ICD-10-CM | POA: Diagnosis not present

## 2023-12-05 DIAGNOSIS — Z6827 Body mass index (BMI) 27.0-27.9, adult: Secondary | ICD-10-CM | POA: Diagnosis not present

## 2023-12-05 DIAGNOSIS — Z111 Encounter for screening for respiratory tuberculosis: Secondary | ICD-10-CM | POA: Diagnosis not present

## 2023-12-05 DIAGNOSIS — M791 Myalgia, unspecified site: Secondary | ICD-10-CM | POA: Diagnosis not present

## 2023-12-05 DIAGNOSIS — E663 Overweight: Secondary | ICD-10-CM | POA: Diagnosis not present

## 2023-12-05 DIAGNOSIS — E538 Deficiency of other specified B group vitamins: Secondary | ICD-10-CM | POA: Diagnosis not present

## 2023-12-05 DIAGNOSIS — Z79899 Other long term (current) drug therapy: Secondary | ICD-10-CM | POA: Diagnosis not present

## 2023-12-05 DIAGNOSIS — R5383 Other fatigue: Secondary | ICD-10-CM | POA: Diagnosis not present

## 2023-12-05 DIAGNOSIS — E559 Vitamin D deficiency, unspecified: Secondary | ICD-10-CM | POA: Diagnosis not present

## 2023-12-05 DIAGNOSIS — M0579 Rheumatoid arthritis with rheumatoid factor of multiple sites without organ or systems involvement: Secondary | ICD-10-CM | POA: Diagnosis not present

## 2023-12-05 DIAGNOSIS — G629 Polyneuropathy, unspecified: Secondary | ICD-10-CM | POA: Diagnosis not present

## 2023-12-13 DIAGNOSIS — R5383 Other fatigue: Secondary | ICD-10-CM | POA: Diagnosis not present

## 2023-12-13 DIAGNOSIS — Z79899 Other long term (current) drug therapy: Secondary | ICD-10-CM | POA: Diagnosis not present

## 2023-12-13 DIAGNOSIS — M0589 Other rheumatoid arthritis with rheumatoid factor of multiple sites: Secondary | ICD-10-CM | POA: Diagnosis not present

## 2024-02-07 DIAGNOSIS — M0589 Other rheumatoid arthritis with rheumatoid factor of multiple sites: Secondary | ICD-10-CM | POA: Diagnosis not present

## 2024-03-07 ENCOUNTER — Ambulatory Visit (INDEPENDENT_AMBULATORY_CARE_PROVIDER_SITE_OTHER)

## 2024-03-07 DIAGNOSIS — M2142 Flat foot [pes planus] (acquired), left foot: Secondary | ICD-10-CM

## 2024-03-07 DIAGNOSIS — G629 Polyneuropathy, unspecified: Secondary | ICD-10-CM

## 2024-03-07 DIAGNOSIS — M2141 Flat foot [pes planus] (acquired), right foot: Secondary | ICD-10-CM

## 2024-03-07 NOTE — Progress Notes (Signed)
  Orthotics   Patient was present and evaluated for Custom molded foot orthotics. Patient will benefit from CFO's to provide total contact to BIL MLA's helping to balance and distribute body weight more evenly across BIL feet helping to reduce plantar pressure and pain. Orthotic will also encourage FF / RF alignment  Patient was scanned today and will return for fitting upon receipt  Lolita Schultze Cped, CFo, CFm   Patient paid [employer] Medicare active with no coverage for L3020

## 2024-04-03 DIAGNOSIS — M0589 Other rheumatoid arthritis with rheumatoid factor of multiple sites: Secondary | ICD-10-CM | POA: Diagnosis not present

## 2024-04-15 ENCOUNTER — Ambulatory Visit

## 2024-04-15 NOTE — Progress Notes (Signed)
 Patient presents today to pick up custom molded foot orthotics, diagnosed with pes planus neuropathy by Dr. Sikora .   Orthotics were dispensed and fit was satisfactory. Reviewed instructions for break-in and wear. Written instructions given to patient.  Patient will follow up as needed.   Lolita Schultze Cped, CFo, CFm

## 2024-05-17 DIAGNOSIS — Z23 Encounter for immunization: Secondary | ICD-10-CM | POA: Diagnosis not present

## 2024-05-27 DIAGNOSIS — H04123 Dry eye syndrome of bilateral lacrimal glands: Secondary | ICD-10-CM | POA: Diagnosis not present

## 2024-05-29 DIAGNOSIS — M0589 Other rheumatoid arthritis with rheumatoid factor of multiple sites: Secondary | ICD-10-CM | POA: Diagnosis not present

## 2024-05-29 DIAGNOSIS — Z79899 Other long term (current) drug therapy: Secondary | ICD-10-CM | POA: Diagnosis not present

## 2024-06-06 DIAGNOSIS — Z111 Encounter for screening for respiratory tuberculosis: Secondary | ICD-10-CM | POA: Diagnosis not present

## 2024-06-06 DIAGNOSIS — Z6827 Body mass index (BMI) 27.0-27.9, adult: Secondary | ICD-10-CM | POA: Diagnosis not present

## 2024-06-06 DIAGNOSIS — E559 Vitamin D deficiency, unspecified: Secondary | ICD-10-CM | POA: Diagnosis not present

## 2024-06-06 DIAGNOSIS — R5383 Other fatigue: Secondary | ICD-10-CM | POA: Diagnosis not present

## 2024-06-06 DIAGNOSIS — M0579 Rheumatoid arthritis with rheumatoid factor of multiple sites without organ or systems involvement: Secondary | ICD-10-CM | POA: Diagnosis not present

## 2024-06-06 DIAGNOSIS — M791 Myalgia, unspecified site: Secondary | ICD-10-CM | POA: Diagnosis not present

## 2024-06-06 DIAGNOSIS — G629 Polyneuropathy, unspecified: Secondary | ICD-10-CM | POA: Diagnosis not present

## 2024-06-06 DIAGNOSIS — E538 Deficiency of other specified B group vitamins: Secondary | ICD-10-CM | POA: Diagnosis not present

## 2024-06-06 DIAGNOSIS — Z79899 Other long term (current) drug therapy: Secondary | ICD-10-CM | POA: Diagnosis not present

## 2024-06-06 DIAGNOSIS — E663 Overweight: Secondary | ICD-10-CM | POA: Diagnosis not present

## 2024-06-11 DIAGNOSIS — I10 Essential (primary) hypertension: Secondary | ICD-10-CM | POA: Diagnosis not present

## 2024-06-11 DIAGNOSIS — Z79899 Other long term (current) drug therapy: Secondary | ICD-10-CM | POA: Diagnosis not present

## 2024-06-11 DIAGNOSIS — E6609 Other obesity due to excess calories: Secondary | ICD-10-CM | POA: Diagnosis not present

## 2024-06-18 DIAGNOSIS — J439 Emphysema, unspecified: Secondary | ICD-10-CM | POA: Diagnosis not present

## 2024-06-18 DIAGNOSIS — Z0001 Encounter for general adult medical examination with abnormal findings: Secondary | ICD-10-CM | POA: Diagnosis not present

## 2024-06-18 DIAGNOSIS — I10 Essential (primary) hypertension: Secondary | ICD-10-CM | POA: Diagnosis not present

## 2024-06-18 DIAGNOSIS — M069 Rheumatoid arthritis, unspecified: Secondary | ICD-10-CM | POA: Diagnosis not present

## 2024-06-18 DIAGNOSIS — N200 Calculus of kidney: Secondary | ICD-10-CM | POA: Diagnosis not present

## 2024-07-01 ENCOUNTER — Telehealth: Payer: Self-pay

## 2024-07-01 NOTE — Telephone Encounter (Signed)
 Patient called in to place a second order of orthotics for this year. Picked up first pair in September.

## 2024-07-01 NOTE — Telephone Encounter (Signed)
 2nd pr of orthotics ordered patient will owe $271.00 half off   Jenine Krisher

## 2024-07-24 DIAGNOSIS — M722 Plantar fascial fibromatosis: Secondary | ICD-10-CM

## 2024-07-24 NOTE — Telephone Encounter (Signed)
 2nd pair of orthotics are in [place]. I spoke to Guthrie and she will come by to pick them up. She is aware they are $271.00

## 2024-08-21 ENCOUNTER — Other Ambulatory Visit: Payer: Self-pay
